# Patient Record
Sex: Male | Born: 1960 | Race: White | Hispanic: No | Marital: Married | Smoking: Former smoker
Health system: Southern US, Community
[De-identification: ages and names within clinical notes are randomized; demographics above are authoritative.]

---

## 2004-10-27 ENCOUNTER — Emergency Department (HOSPITAL_COMMUNITY): Admission: RE | Admit: 2004-10-27 | Discharge: 2004-10-27 | Payer: Self-pay | Admitting: Internal Medicine

## 2020-09-29 ENCOUNTER — Encounter (HOSPITAL_COMMUNITY): Payer: Self-pay

## 2020-09-29 ENCOUNTER — Emergency Department (HOSPITAL_COMMUNITY): Payer: Self-pay

## 2020-09-29 ENCOUNTER — Other Ambulatory Visit: Payer: Self-pay

## 2020-09-29 ENCOUNTER — Inpatient Hospital Stay (HOSPITAL_COMMUNITY)
Admission: EM | Admit: 2020-09-29 | Discharge: 2020-10-16 | DRG: 166 | Disposition: A | Discharge status: Hospice | Payer: Self-pay | Attending: Internal Medicine | Admitting: Internal Medicine

## 2020-09-29 DIAGNOSIS — C787 Secondary malignant neoplasm of liver and intrahepatic bile duct: Secondary | ICD-10-CM | POA: Diagnosis present

## 2020-09-29 DIAGNOSIS — I6340 Cerebral infarction due to embolism of unspecified cerebral artery: Secondary | ICD-10-CM | POA: Diagnosis not present

## 2020-09-29 DIAGNOSIS — J90 Pleural effusion, not elsewhere classified: Secondary | ICD-10-CM

## 2020-09-29 DIAGNOSIS — R918 Other nonspecific abnormal finding of lung field: Secondary | ICD-10-CM

## 2020-09-29 DIAGNOSIS — Z20822 Contact with and (suspected) exposure to covid-19: Secondary | ICD-10-CM | POA: Diagnosis present

## 2020-09-29 DIAGNOSIS — J9601 Acute respiratory failure with hypoxia: Secondary | ICD-10-CM | POA: Diagnosis present

## 2020-09-29 DIAGNOSIS — N179 Acute kidney failure, unspecified: Secondary | ICD-10-CM | POA: Diagnosis present

## 2020-09-29 DIAGNOSIS — Z66 Do not resuscitate: Secondary | ICD-10-CM | POA: Diagnosis not present

## 2020-09-29 DIAGNOSIS — I214 Non-ST elevation (NSTEMI) myocardial infarction: Secondary | ICD-10-CM

## 2020-09-29 DIAGNOSIS — Z91041 Radiographic dye allergy status: Secondary | ICD-10-CM

## 2020-09-29 DIAGNOSIS — Z9889 Other specified postprocedural states: Secondary | ICD-10-CM

## 2020-09-29 DIAGNOSIS — F419 Anxiety disorder, unspecified: Secondary | ICD-10-CM | POA: Diagnosis not present

## 2020-09-29 DIAGNOSIS — R000 Tachycardia, unspecified: Secondary | ICD-10-CM

## 2020-09-29 DIAGNOSIS — Z79899 Other long term (current) drug therapy: Secondary | ICD-10-CM

## 2020-09-29 DIAGNOSIS — M25512 Pain in left shoulder: Secondary | ICD-10-CM | POA: Diagnosis not present

## 2020-09-29 DIAGNOSIS — I110 Hypertensive heart disease with heart failure: Secondary | ICD-10-CM | POA: Diagnosis present

## 2020-09-29 DIAGNOSIS — R778 Other specified abnormalities of plasma proteins: Secondary | ICD-10-CM | POA: Diagnosis present

## 2020-09-29 DIAGNOSIS — I959 Hypotension, unspecified: Secondary | ICD-10-CM | POA: Diagnosis not present

## 2020-09-29 DIAGNOSIS — R29702 NIHSS score 2: Secondary | ICD-10-CM | POA: Diagnosis not present

## 2020-09-29 DIAGNOSIS — D509 Iron deficiency anemia, unspecified: Secondary | ICD-10-CM | POA: Diagnosis present

## 2020-09-29 DIAGNOSIS — C7951 Secondary malignant neoplasm of bone: Secondary | ICD-10-CM | POA: Diagnosis present

## 2020-09-29 DIAGNOSIS — Z87891 Personal history of nicotine dependence: Secondary | ICD-10-CM

## 2020-09-29 DIAGNOSIS — Z8043 Family history of malignant neoplasm of testis: Secondary | ICD-10-CM

## 2020-09-29 DIAGNOSIS — M7989 Other specified soft tissue disorders: Secondary | ICD-10-CM | POA: Diagnosis present

## 2020-09-29 DIAGNOSIS — I472 Ventricular tachycardia: Secondary | ICD-10-CM | POA: Diagnosis not present

## 2020-09-29 DIAGNOSIS — B952 Enterococcus as the cause of diseases classified elsewhere: Secondary | ICD-10-CM | POA: Diagnosis present

## 2020-09-29 DIAGNOSIS — J910 Malignant pleural effusion: Secondary | ICD-10-CM | POA: Diagnosis present

## 2020-09-29 DIAGNOSIS — G9341 Metabolic encephalopathy: Secondary | ICD-10-CM | POA: Diagnosis not present

## 2020-09-29 DIAGNOSIS — R7989 Other specified abnormal findings of blood chemistry: Secondary | ICD-10-CM | POA: Diagnosis not present

## 2020-09-29 DIAGNOSIS — I4891 Unspecified atrial fibrillation: Secondary | ICD-10-CM | POA: Diagnosis not present

## 2020-09-29 DIAGNOSIS — R32 Unspecified urinary incontinence: Secondary | ICD-10-CM | POA: Diagnosis not present

## 2020-09-29 DIAGNOSIS — I248 Other forms of acute ischemic heart disease: Secondary | ICD-10-CM | POA: Diagnosis present

## 2020-09-29 DIAGNOSIS — J9811 Atelectasis: Secondary | ICD-10-CM | POA: Diagnosis present

## 2020-09-29 DIAGNOSIS — C799 Secondary malignant neoplasm of unspecified site: Secondary | ICD-10-CM

## 2020-09-29 DIAGNOSIS — I2510 Atherosclerotic heart disease of native coronary artery without angina pectoris: Secondary | ICD-10-CM | POA: Diagnosis present

## 2020-09-29 DIAGNOSIS — I5021 Acute systolic (congestive) heart failure: Secondary | ICD-10-CM | POA: Diagnosis present

## 2020-09-29 DIAGNOSIS — G4700 Insomnia, unspecified: Secondary | ICD-10-CM | POA: Diagnosis not present

## 2020-09-29 DIAGNOSIS — C3412 Malignant neoplasm of upper lobe, left bronchus or lung: Principal | ICD-10-CM | POA: Diagnosis present

## 2020-09-29 DIAGNOSIS — I4892 Unspecified atrial flutter: Secondary | ICD-10-CM | POA: Diagnosis not present

## 2020-09-29 DIAGNOSIS — N390 Urinary tract infection, site not specified: Secondary | ICD-10-CM | POA: Diagnosis present

## 2020-09-29 DIAGNOSIS — R0602 Shortness of breath: Secondary | ICD-10-CM

## 2020-09-29 DIAGNOSIS — R059 Cough, unspecified: Secondary | ICD-10-CM

## 2020-09-29 DIAGNOSIS — Z5329 Procedure and treatment not carried out because of patient's decision for other reasons: Secondary | ICD-10-CM | POA: Diagnosis not present

## 2020-09-29 DIAGNOSIS — C7801 Secondary malignant neoplasm of right lung: Secondary | ICD-10-CM | POA: Diagnosis present

## 2020-09-29 DIAGNOSIS — I253 Aneurysm of heart: Secondary | ICD-10-CM | POA: Diagnosis present

## 2020-09-29 DIAGNOSIS — I471 Supraventricular tachycardia: Secondary | ICD-10-CM | POA: Diagnosis not present

## 2020-09-29 DIAGNOSIS — I619 Nontraumatic intracerebral hemorrhage, unspecified: Secondary | ICD-10-CM | POA: Diagnosis not present

## 2020-09-29 DIAGNOSIS — I639 Cerebral infarction, unspecified: Secondary | ICD-10-CM

## 2020-09-29 DIAGNOSIS — E785 Hyperlipidemia, unspecified: Secondary | ICD-10-CM | POA: Diagnosis present

## 2020-09-29 DIAGNOSIS — Z515 Encounter for palliative care: Secondary | ICD-10-CM

## 2020-09-29 LAB — APTT: aPTT: 28 seconds (ref 24–36)

## 2020-09-29 LAB — TROPONIN I (HIGH SENSITIVITY)
Troponin I (High Sensitivity): 1087 ng/L (ref <18)
Troponin I (High Sensitivity): 1380 ng/L (ref <18)

## 2020-09-29 LAB — CBC WITH DIFFERENTIAL/PLATELET
Abs Immature Granulocytes: 0.09 10*3/uL — ABNORMAL HIGH (ref 0.00–0.07)
Basophils Absolute: 0 10*3/uL (ref 0.0–0.1)
Basophils Relative: 0 %
Eosinophils Absolute: 0 10*3/uL (ref 0.0–0.5)
Eosinophils Relative: 0 %
HCT: 44.2 % (ref 39.0–52.0)
Hemoglobin: 14.1 g/dL (ref 13.0–17.0)
Immature Granulocytes: 1 %
Lymphocytes Relative: 7 %
Lymphs Abs: 1 10*3/uL (ref 0.7–4.0)
MCH: 25.1 pg — ABNORMAL LOW (ref 26.0–34.0)
MCHC: 31.9 g/dL (ref 30.0–36.0)
MCV: 78.6 fL — ABNORMAL LOW (ref 80.0–100.0)
Monocytes Absolute: 1.3 10*3/uL — ABNORMAL HIGH (ref 0.1–1.0)
Monocytes Relative: 10 %
Neutro Abs: 10.7 10*3/uL — ABNORMAL HIGH (ref 1.7–7.7)
Neutrophils Relative %: 82 %
Platelets: 306 10*3/uL (ref 150–400)
RBC: 5.62 MIL/uL (ref 4.22–5.81)
RDW: 17.8 % — ABNORMAL HIGH (ref 11.5–15.5)
WBC: 13.2 10*3/uL — ABNORMAL HIGH (ref 4.0–10.5)
nRBC: 0 % (ref 0.0–0.2)

## 2020-09-29 LAB — COMPREHENSIVE METABOLIC PANEL
ALT: 29 U/L (ref 0–44)
AST: 33 U/L (ref 15–41)
Albumin: 2.7 g/dL — ABNORMAL LOW (ref 3.5–5.0)
Alkaline Phosphatase: 90 U/L (ref 38–126)
Anion gap: 10 (ref 5–15)
BUN: 13 mg/dL (ref 6–20)
CO2: 22 mmol/L (ref 22–32)
Calcium: 9 mg/dL (ref 8.9–10.3)
Chloride: 100 mmol/L (ref 98–111)
Creatinine, Ser: 1.14 mg/dL (ref 0.61–1.24)
GFR, Estimated: >60 mL/min (ref >60)
Glucose, Bld: 113 mg/dL — ABNORMAL HIGH (ref 70–99)
Potassium: 3.7 mmol/L (ref 3.5–5.1)
Sodium: 132 mmol/L — ABNORMAL LOW (ref 135–145)
Total Bilirubin: 0.6 mg/dL (ref 0.3–1.2)
Total Protein: 6.3 g/dL — ABNORMAL LOW (ref 6.5–8.1)

## 2020-09-29 LAB — BRAIN NATRIURETIC PEPTIDE: B Natriuretic Peptide: 205 pg/mL — ABNORMAL HIGH (ref 0.0–100.0)

## 2020-09-29 LAB — RESP PANEL BY RT-PCR (FLU A&B, COVID) ARPGX2
Influenza A by PCR: NEGATIVE
Influenza B by PCR: NEGATIVE
SARS Coronavirus 2 by RT PCR: NEGATIVE

## 2020-09-29 LAB — URINALYSIS, ROUTINE W REFLEX MICROSCOPIC
Bacteria, UA: NONE SEEN
Bilirubin Urine: NEGATIVE
Glucose, UA: NEGATIVE mg/dL
Ketones, ur: NEGATIVE mg/dL
Leukocytes,Ua: NEGATIVE
Nitrite: NEGATIVE
Protein, ur: NEGATIVE mg/dL
Specific Gravity, Urine: 1.005 (ref 1.005–1.030)
pH: 6 (ref 5.0–8.0)

## 2020-09-29 LAB — LACTIC ACID, PLASMA: Lactic Acid, Venous: 1.3 mmol/L (ref 0.5–1.9)

## 2020-09-29 LAB — PROTIME-INR
INR: 1.2 (ref 0.8–1.2)
Prothrombin Time: 14.3 seconds (ref 11.4–15.2)

## 2020-09-29 MED ORDER — SODIUM CHLORIDE 0.9 % IV SOLN
500.0000 mg | INTRAVENOUS | Status: DC
  Administered 2020-09-29: 500 mg via INTRAVENOUS
  Filled 2020-09-29: qty 500

## 2020-09-29 MED ORDER — ONDANSETRON HCL 4 MG PO TABS: 4.0000 mg | ORAL_TABLET | Freq: Four times a day (QID) | ORAL | Status: DC | PRN

## 2020-09-29 MED ORDER — ACETAMINOPHEN 650 MG RE SUPP: 650.0000 mg | Freq: Four times a day (QID) | RECTAL | Status: DC | PRN

## 2020-09-29 MED ORDER — LACTATED RINGERS IV SOLN: INTRAVENOUS | Status: DC

## 2020-09-29 MED ORDER — POLYETHYLENE GLYCOL 3350 17 G PO PACK: 17.0000 g | PACK | Freq: Every day | ORAL | Status: DC | PRN

## 2020-09-29 MED ORDER — POTASSIUM CHLORIDE IN NACL 20-0.9 MEQ/L-% IV SOLN
INTRAVENOUS | Status: DC
  Filled 2020-09-29: qty 1000

## 2020-09-29 MED ORDER — HEPARIN BOLUS VIA INFUSION
4000.0000 [IU] | Freq: Once | INTRAVENOUS | Status: AC
  Administered 2020-09-29: 4000 [IU] via INTRAVENOUS

## 2020-09-29 MED ORDER — SODIUM CHLORIDE 0.9 % IV SOLN
2.0000 g | INTRAVENOUS | Status: DC
  Administered 2020-09-29: 2 g via INTRAVENOUS
  Filled 2020-09-29: qty 20

## 2020-09-29 MED ORDER — ACETAMINOPHEN 325 MG PO TABS
650.0000 mg | ORAL_TABLET | Freq: Four times a day (QID) | ORAL | Status: DC | PRN
  Administered 2020-10-04 – 2020-10-10 (×2): 650 mg via ORAL
  Filled 2020-09-29 (×2): qty 2

## 2020-09-29 MED ORDER — HEPARIN (PORCINE) 25000 UT/250ML-% IV SOLN
2100.0000 [IU]/h | INTRAVENOUS | Status: DC
  Administered 2020-09-29: 1100 [IU]/h via INTRAVENOUS
  Administered 2020-09-30: 1950 [IU]/h via INTRAVENOUS
  Administered 2020-09-30: 1500 [IU]/h via INTRAVENOUS
  Administered 2020-10-01 – 2020-10-03 (×4): 1950 [IU]/h via INTRAVENOUS
  Administered 2020-10-04 – 2020-10-05 (×2): 2100 [IU]/h via INTRAVENOUS
  Filled 2020-09-29 (×11): qty 250

## 2020-09-29 MED ORDER — ONDANSETRON HCL 4 MG/2ML IJ SOLN
4.0000 mg | Freq: Four times a day (QID) | INTRAMUSCULAR | Status: DC | PRN
  Administered 2020-10-05 – 2020-10-06 (×3): 4 mg via INTRAVENOUS
  Filled 2020-09-29 (×3): qty 2

## 2020-09-29 MED ORDER — LABETALOL HCL 5 MG/ML IV SOLN
10.0000 mg | INTRAVENOUS | Status: DC | PRN
  Administered 2020-09-29: 10 mg via INTRAVENOUS
  Filled 2020-09-29: qty 4

## 2020-09-29 MED ORDER — LABETALOL HCL 5 MG/ML IV SOLN: 10.0000 mg | INTRAVENOUS | Status: DC | PRN

## 2020-09-29 NOTE — H&P (Addendum)
History and Physical    Brett Medina KZS:[id number] DOB: Mar 12, 1961 DOA: 09/29/2020  PCP: Patient, No Pcp Per  Patient coming from: Home  I have personally briefly reviewed patient's old medical records in [place]  Chief Complaint: Difficulty breathing  HPI: Brett Medina is a 59 y.o. male with medical history significant for tobacco abuse.  Presented to the ED with complaints of difficulty breathing ongoing over the past 2 months at least.  Reports he was seen at urgent care several x40 was given prednisone.  Patient quit smoking cigarettes about 2 months ago. He denies chest pain.  Has chronic unchanged bilateral lower extremity swelling.  Reports a productive cough over the past few weeks. No family history of heart attacks.  ED Course: O2 sats 91 to 97% on room air.  Tachycardic to 135, mild tachypnea to 22.  Lactic acid 1.3.  WBC 13.2.  Troponin 1087.  UA with moderate hemoglobin.  Chest CT-Constellation of findings consistent with metastatic malignancy, moderate to large volume left pleural effusion with almost complete collapse of left upper and lower lobes (see detailed report).  Patient reports contrast allergy, he refused CT with contrast to rule out PE as he was tachycardic.  Ceftriaxone and doxycycline given for possible pneumonia.  Heparin drip started.  Hospitalist admit for large left pleural effusion.  Review of Systems: As per HPI all other systems reviewed and negative.  History reviewed. No pertinent past medical history.  History reviewed. No pertinent surgical history.   reports that he quit smoking about 2 months ago. His smoking use included cigarettes. He has never used smokeless tobacco. He reports current alcohol use. He reports previous drug use. Drug: Marijuana.  No Known Allergies  No known family history of coronary artery disease.  Prior to Admission medications   Medication Sig Start Date End Date Taking? Authorizing Provider  albuterol  (VENTOLIN HFA) 108 (90 Base) MCG/ACT inhaler Inhale 2 puffs into the lungs every 6 (six) hours as needed. 09/14/20 09/14/21 Yes [provider]  benzonatate (TESSALON) 100 MG capsule Take 1 capsule by mouth every 6 (six) hours as needed for cough. 09/21/20 09/21/21 Yes [provider]  doxycycline (VIBRA-TABS) 100 MG tablet Take 100 mg by mouth 2 (two) times daily. 09/21/20  Yes [provider]    Physical Exam: Vitals:   09/29/20 1537 09/29/20 1546 09/29/20 1700 09/29/20 1830  BP: (!) 171/122  (!) 152/112 (!) 138/109  Pulse: (!) 126  (!) 131 (!) 124  Resp: (!) 22  (!) 22 (!) 22  Temp:      TempSrc:      SpO2: 92% 91% 97% 96%  Weight:      Height:        Constitutional: Mild increased work of breathing, otherwise appears comfortable Vitals:   09/29/20 1537 09/29/20 1546 09/29/20 1700 09/29/20 1830  BP: (!) 171/122  (!) 152/112 (!) 138/109  Pulse: (!) 126  (!) 131 (!) 124  Resp: (!) 22  (!) 22 (!) 22  Temp:      TempSrc:      SpO2: 92% 91% 97% 96%  Weight:      Height:       Eyes: PERRL, lids and conjunctivae normal ENMT: Mucous membranes are dry. Neck: normal, supple, no masses, no thyromegaly Respiratory: Absent breath sounds left lung, mild expiratory rhonchi right lung.  Mild increased work of breathing.   Cardiovascular: Tachycardic, regular rate and rhythm, no murmurs / rubs / gallops.  +  1 chronic extremity edema.  Bilateral lower extremities warm and well perfused. Abdomen: no tenderness, no masses palpated. No hepatosplenomegaly. Bowel sounds positive.  Musculoskeletal: no clubbing / cyanosis. No joint deformity upper and lower extremities. Good ROM, no contractures. Normal muscle tone.  Skin: no rashes, lesions, ulcers. No induration Neurologic: No apparent cranial abnormality, moving extremities spontaneously Psychiatric: Normal judgment and insight. Alert and oriented x 3. Normal mood.   Labs on Admission: I have personally reviewed  following labs and imaging studies  CBC: Recent Labs  Lab 09/29/20 1635  WBC 13.2*  NEUTROABS 10.7*  HGB 14.1  HCT 44.2  MCV 78.6*  PLT 308   Basic Metabolic Panel: Recent Labs  Lab 09/29/20 1635  NA 132*  K 3.7  CL 100  CO2 22  GLUCOSE 113*  BUN 13  CREATININE 1.14  CALCIUM 9.0   Liver Function Tests: Recent Labs  Lab 09/29/20 1635  AST 33  ALT 29  ALKPHOS 90  BILITOT 0.6  PROT 6.3*  ALBUMIN 2.7*   Coagulation Profile: Recent Labs  Lab 09/29/20 1635  INR 1.2    Urine analysis:    Component Value Date/Time   COLORURINE YELLOW 09/29/2020 [place] 09/29/2020 1751   LABSPEC 1.005 09/29/2020 1751   PHURINE 6.0 09/29/2020 1751   GLUCOSEU NEGATIVE 09/29/2020 1751   HGBUR MODERATE (A) 09/29/2020 1751   BILIRUBINUR NEGATIVE 09/29/2020 1751   KETONESUR NEGATIVE 09/29/2020 1751   PROTEINUR NEGATIVE 09/29/2020 1751   NITRITE NEGATIVE 09/29/2020 1751   LEUKOCYTESUR NEGATIVE 09/29/2020 1751    Radiological Exams on Admission: DG Chest 2 View  Result Date: 09/29/2020 CLINICAL DATA:  Shortness of breath EXAM: CHEST - 2 VIEW COMPARISON:  October 27, 2004 FINDINGS: There is essentially complete consolidation of the left hemithorax with probable combination of pleural effusion and extensive airspace consolidation throughout the left lung. There is a nodular opacity in the periphery of the right mid lung measuring 1.0 x 0.9 cm. There is interstitial prominence on the right which in part may be due to redistribution of blood flow to viable lung segments. Heart size appears grossly normal. Pulmonary vascularity on the right is normal. No adenopathy is evident in areas that can be assessed for potential adenopathy by radiography. Note that opacification on the left precludes assessment for potential adenopathy on the left. No bone lesions. IMPRESSION: Widespread airspace opacity on the left, likely due to combination of consolidation and pleural effusion.  Underlying neoplasm on the left cannot be excluded. Given this circumstance, correlation with contrast enhanced chest CT may well be advisable at this time to further evaluate Nodular opacity periphery of right mid lung concerning for potential small neoplastic focus. Interstitial prominence on the right may in large part be due to redistribution of blood flow to viable lung segments. Heart size within normal limits. Electronically Signed   By: Lowella Grip III M.D.   On: 09/29/2020 16:13   CT Chest Wo Contrast  Result Date: 09/29/2020 CLINICAL DATA:  Chest pain and shortness of breath worsening for the past 2 months. Evaluation for malignancy, infection, mass, fluid. EXAM: CT CHEST WITHOUT CONTRAST TECHNIQUE: Multidetector CT imaging of the chest was performed following the standard protocol without IV contrast. COMPARISON:  Chest x-ray 09/29/2020, chest x-ray 10/27/2004 FINDINGS: Cardiovascular: Normal heart size. Trace pericardial effusion. The aortic root appears to be normal in caliber. Suggestion of possible mild aortic valve leaflet calcifications. The ascending thoracic aorta is enlarged in caliber measuring up to 4.5  cm. The descending thoracic aorta is normal in caliber. Mild atherosclerotic plaque of the thoracic aorta. Possible mild left anterior descending coronary artery calcification. Mediastinum/Nodes: There is a 1.1 cm infra cardiac enlarged round lymph node (2:127). Enlarged mediastinal lymph nodes with as an example a right 1 cm paratracheal lymph node (2: 44) and a enlarged 1.1 cm subcarinal lymph node (2:65). Limited sensitivity for the detection of hilar adenopathy on this noncontrast study. No axillary lymphadenopathy bilaterally. Lungs/Pleura: At least mild paraseptal and severe centrilobular emphysematous changes. Almost complete collapse of the left upper and lower lobes. There is a 1 cm solid round pulmonary nodule within the right middle lobe (4:83,99). Slightly more inferior,  there there are a 0.6 cm right middle lobe pulmonary nodules (4:89). Several pulmonary micronodules at the right apex (4:17, 24, 31). Couple of round pulmonary nodule within the right lower lobe measuring 0.5 and 0.6 cm (4:1 16-117). Ground-glass subpleural nodule measuring 0.7 cm in the right middle lobe (4:96). Peribronchovascular nodular like 3.2 cm ground-glass airspace opacity within the right lower lobe (4:111). Diffuse mild bronchial wall thickening. Moderate to large volume simple fluid left pleural effusion. No right pleural effusion. No pneumothorax. Upper Abdomen: Suggestion of a partially visualized splenule. Otherwise no acute abnormality. Musculoskeletal: Indeterminate 1 cm subcutaneus soft tissue density within the left anterolateral back at the level of scapula (2:55). Several other subcentimeter soft tissue densities within the left lower back (2:68, 99). Possible seroma findings that is collimated off view within the right anterolateral back (2:52). Sclerotic thoracic vertebra lesions within the T6, T9, T11, T12 vertebral bodies. Possible tiny sclerotic foci within the left aspect of the T7 vertebral body (6:103). Similarly within the left aspect of the T4 and T5 vertebral bodies. Sclerotic lesion within the right transverse process of the T3 vertebral body (4:10). No acute displaced fracture. Multilevel mild degenerative changes of the spine. IMPRESSION: 1. Constellation of findings consistent with metastatic malignancy. 2. Moderate to large volume left pleural effusion with almost complete collapse of the left upper and lower lobes. Associated mediastinal lymphadenopathy and limited evaluation for hilar lymphadenopathy on this noncontrast study. Underlying malignancy suspected (ddx includes metastasis or primary lung). Consider analysis of the pleural fluid for further evaluation. 3. Indeterminate multifocal right lung pulmonary nodules measuring up to 1 cm in the right middle lobe. 4.  Indeterminate 3.2 cm peribronchovascular ground-glass airspace opacity within the right lower lobe. 5. Scattered suspicious sclerotic lesions concerning for osseous metastases. 6. Indeterminate pericentimeter subcutaneus soft tissue densities within the bilateral anterolateral back at the level of scapulas. 7. Severe emphysematous changes. 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 9. Aortic Atherosclerosis (ICD10-I70.0) and Emphysema (ICD10-J43.9). Electronically Signed   By: Iven Finn M.D.   On: 09/29/2020 17:19    EKG: Independently reviewed.  Sinus tachycardia rate 128.  No significant ST or T wave abnormalities.  No old EKG to compare.  Assessment/Plan Active Problems:   Large pleural effusion   Elevated troponin  Large left pleural effusion-not hypoxic O2 sat 91 to 97% on room air, placed on 2 L for comfort, chest CT-findings consistent with metastatic malignancy with moderate to large left pleural effusion, almost complete collapse of left upper and lower lobes, mediastinal lymphadenopathy, suspicious show sclerotic lesions concerning for osseous metastasis ( see detailed report). ??? If Pleural tap can be done considering metastatic disease. -Will admit to [facility], patient will need multiple subspecialties- IR, oncology to start with - IR consult placed, US guided thoracentesis in  a.m - Pls consult oncology in a.m. -CBC in a.m. -Continue IV heparin for now, his large pleural effusion likely driving his tachycardia, but if persistent tachycardia may need to rule out PE. (Currently refusing CT with contrast due to allergy reaction, even with premedication). - IVF n/s + 20kcl 75cc/hr x 12hrs  Elevated troponin-1087 > 1380s, Quite high to attribute to just demand ischemia.  No cardiac history. EKG without significant abnormalities, no old EKG to compare.  He was started on heparin drip prophylactically in ED.  -Obtain echocardiogram -Trend troponin -For now continue heparin  drip - Pls consult cardiology in a.m -Lipid panel in a.m.  Leukocytosis-13.2 and tachycardia, likely stress reaction due to large left pleural effusion and dyspnea.  Normal lactic acid 1.3.  Chest CT not convincing for pneumonia. -Will hold off on further antibiotics at this time  DVT prophylaxis: Heparin drip Code Status:.Full code Family Communication: Spouse at bedside Disposition Plan:  > 2 days.  Pending pleural fluid evaluation. Consults called: Please consult IR and oncology in a.m. Admission status: Inpatient, stepdown. I certify that at the point of admission it is my clinical judgment that the patient will require inpatient hospital care spanning beyond 2 midnights from the point of admission due to high intensity of service, high risk for further deterioration and high frequency of surveillance required. The following factors support the patient status of inpatient: Mild left pleural effusion with elevated troponin requiring close monitoring.   Bethena Roys MD [facility]  09/29/2020, 9:18 PM

## 2020-09-29 NOTE — ED Notes (Signed)
ED TO INPATIENT HANDOFF REPORT  ED Nurse Name and Phone #: Wilhelmina Hark [phone]  S Name/Age/Gender Brett Medina 59 y.o. male Room/Bed: APA04/APA04  Code Status   Code Status: Not on file  Home/SNF/Other Home Patient oriented to: self, place, time and situation Is this baseline? Yes   Triage Complete: Triage complete  Chief Complaint Large pleural effusion [J90]  Triage Note Pt to er, pt states that he has been sob for the past few months, states that it has recently gotten worse.  States that he was tested for covid last Saturday and Tuesday he got the result that he was negative, states that he has been to urgent care several times and they have given him prednisone.      Allergies No Known Allergies  Level of Care/Admitting Diagnosis ED Disposition    ED Disposition Condition [facility] Area: [place] [[id number]]  Level of Care: Progressive [102]  Admit to Progressive based on following criteria: CARDIOVASCULAR & THORACIC of moderate stability with acute coronary syndrome symptoms/low risk myocardial infarction/hypertensive urgency/arrhythmias/heart failure potentially compromising stability and stable post cardiovascular intervention patients.  May admit patient to [facility] or [facility] if equivalent level of care is available:: No  Covid Evaluation: Asymptomatic Screening Protocol (No Symptoms)  Diagnosis: Large pleural effusion [[id number]]  Admitting Physician: Bethena Roys [[id number]]  Attending Physician: Bethena Roys [phone]  Estimated length of stay: past midnight tomorrow  Certification:: I certify this patient will need inpatient services for at least 2 midnights       B Medical/Surgery History History reviewed. No pertinent past medical history. History reviewed. No pertinent surgical history.   A IV Location/Drains/Wounds Patient Lines/Drains/Airways Status    Active Line/Drains/Airways    Name Placement  date Placement time Site Days   Peripheral IV 09/29/20 Left Hand 09/29/20  1630  Hand  less than 1   Peripheral IV 09/29/20 Right Hand 09/29/20  1645  Hand  less than 1          Intake/Output Last 24 hours No intake or output data in the 24 hours ending 09/29/20 1913  Labs/Imaging Results for orders placed or performed during the hospital encounter of 09/29/20 (from the past 48 hour(s))  Lactic acid, plasma     Status: None   Collection Time: 09/29/20  4:35 PM  Result Value Ref Range   Lactic Acid, Venous 1.3 0.5 - 1.9 mmol/L    Comment: Performed at [facility], [address], [place], [place] [address]  Comprehensive metabolic panel     Status: Abnormal   Collection Time: 09/29/20  4:35 PM  Result Value Ref Range   Sodium 132 (L) 135 - 145 mmol/L   Potassium 3.7 3.5 - 5.1 mmol/L   Chloride 100 98 - 111 mmol/L   CO2 22 22 - 32 mmol/L   Glucose, Bld 113 (H) 70 - 99 mg/dL    Comment: Glucose reference range applies only to samples taken after fasting for at least 8 hours.   BUN 13 6 - 20 mg/dL   Creatinine, Ser 1.14 0.61 - 1.24 mg/dL   Calcium 9.0 8.9 - 10.3 mg/dL   Total Protein 6.3 (L) 6.5 - 8.1 g/dL   Albumin 2.7 (L) 3.5 - 5.0 g/dL   AST 33 15 - 41 U/L   ALT 29 0 - 44 U/L   Alkaline Phosphatase 90 38 - 126 U/L   Total Bilirubin 0.6 0.3 - 1.2 mg/dL  GFR, Estimated >60 >60 mL/min    Comment: (NOTE) Calculated using the CKD-EPI Creatinine Equation (2021)    Anion gap 10 5 - 15    Comment: Performed at [facility], [address], [place], [place] [address]  CBC WITH DIFFERENTIAL     Status: Abnormal   Collection Time: 09/29/20  4:35 PM  Result Value Ref Range   WBC 13.2 (H) 4.0 - 10.5 K/uL   RBC 5.62 4.22 - 5.81 MIL/uL   Hemoglobin 14.1 13.0 - 17.0 g/dL   HCT 44.2 39 - 52 %   MCV 78.6 (L) 80.0 - 100.0 fL   MCH 25.1 (L) 26.0 - 34.0 pg   MCHC 31.9 30.0 - 36.0 g/dL   RDW 17.8 (H) 11.5 - 15.5 %   Platelets 306 150 - 400 K/uL   nRBC 0.0 0.0 - 0.2 %   Neutrophils  Relative % 82 %   Neutro Abs 10.7 (H) 1.7 - 7.7 K/uL   Lymphocytes Relative 7 %   Lymphs Abs 1.0 0.7 - 4.0 K/uL   Monocytes Relative 10 %   Monocytes Absolute 1.3 (H) 0.1 - 1.0 K/uL   Eosinophils Relative 0 %   Eosinophils Absolute 0.0 0.0 - 0.5 K/uL   Basophils Relative 0 %   Basophils Absolute 0.0 0.0 - 0.1 K/uL   Immature Granulocytes 1 %   Abs Immature Granulocytes 0.09 (H) 0.00 - 0.07 K/uL    Comment: Performed at [facility], [address] [place], [place] [address]  Protime-INR     Status: None   Collection Time: 09/29/20  4:35 PM  Result Value Ref Range   Prothrombin Time 14.3 11.4 - 15.2 seconds   INR 1.2 0.8 - 1.2    Comment: (NOTE) INR goal varies based on device and disease states. Performed at [facility], [address] [place], [place] [address]   APTT     Status: None   Collection Time: 09/29/20  4:35 PM  Result Value Ref Range   aPTT 28 24 - 36 seconds    Comment: Performed at [facility], [address] [place], [place] [address]  Blood Culture (routine x 2)     Status: None (Preliminary result)   Collection Time: 09/29/20  4:35 PM   Specimen: BLOOD LEFT HAND  Result Value Ref Range   Specimen Description BLOOD LEFT HAND    Special Requests      BOTTLES DRAWN AEROBIC AND ANAEROBIC Blood Culture adequate volume Performed at [facility], [address] [place], [place] [address]    Culture PENDING    Report Status PENDING   Brain natriuretic peptide     Status: Abnormal   Collection Time: 09/29/20  4:35 PM  Result Value Ref Range   B Natriuretic Peptide 205.0 (H) 0.0 - 100.0 pg/mL    Comment: Performed at [facility], [address] [place], [place] [address]  Troponin I (High Sensitivity)     Status: Abnormal   Collection Time: 09/29/20  4:35 PM  Result Value Ref Range   Troponin I (High Sensitivity) 1,087 (HH) <18 ng/L    Comment: CRITICAL RESULT CALLED TO, READ BACK BY AND VERIFIED WITH: BILLINGS,D @ 1749 ON 09/29/20 BY JUW (NOTE) Elevated  high sensitivity troponin I (hsTnI) values and significant  changes across serial measurements may suggest ACS but many other  chronic and acute conditions are known to elevate hsTnI results.  Refer to the Links section for chest pain algorithms and additional  guidance. Performed  at [facility], [address] [place], [place] [address]   Blood Culture (routine x 2)     Status: None (Preliminary result)   Collection Time: 09/29/20  4:45 PM   Specimen: BLOOD RIGHT HAND  Result Value Ref Range   Specimen Description BLOOD RIGHT HAND    Special Requests      BOTTLES DRAWN AEROBIC AND ANAEROBIC Blood Culture results may not be optimal due to an inadequate volume of blood received in culture bottles Performed at [facility], [address], [place], [place] [address]    Culture PENDING    Report Status PENDING   Resp Panel by RT-PCR (Flu A&B, Covid) Nasopharyngeal Swab     Status: None   Collection Time: 09/29/20  5:07 PM   Specimen: Nasopharyngeal Swab; Nasopharyngeal(NP) swabs in vial transport medium  Result Value Ref Range   SARS Coronavirus 2 by RT PCR NEGATIVE NEGATIVE    Comment: (NOTE) SARS-CoV-2 target nucleic acids are NOT DETECTED.  The SARS-CoV-2 RNA is generally detectable in upper respiratory specimens during the acute phase of infection. The lowest concentration of SARS-CoV-2 viral copies this assay can detect is 138 copies/mL. A negative result does not preclude SARS-Cov-2 infection and should not be used as the sole basis for treatment or other patient management decisions. A negative result may occur with  improper specimen collection/handling, submission of specimen other than nasopharyngeal swab, presence of viral mutation(s) within the areas targeted by this assay, and inadequate number of viral copies(<138 copies/mL). A negative result must be combined with clinical observations, patient history, and epidemiological information. The expected result is  Negative.  Fact Sheet for Patients:  EntrepreneurPulse.com.au  Fact Sheet for Healthcare Providers:  IncredibleEmployment.be  This test is no t yet approved or cleared by the [place] FDA and  has been authorized for detection and/or diagnosis of SARS-CoV-2 by FDA under an Emergency Use Authorization (EUA). This EUA will remain  in effect (meaning this test can be used) for the duration of the COVID-19 declaration under Section 564(b)(1) of the Act, 21 U.S.C.section 360bbb-3(b)(1), unless the authorization is terminated  or revoked sooner.       Influenza A by PCR NEGATIVE NEGATIVE   Influenza B by PCR NEGATIVE NEGATIVE    Comment: (NOTE) The Xpert Xpress SARS-CoV-2/FLU/RSV plus assay is intended as an aid in the diagnosis of influenza from Nasopharyngeal swab specimens and should not be used as a sole basis for treatment. Nasal washings and aspirates are unacceptable for Xpert Xpress SARS-CoV-2/FLU/RSV testing.  Fact Sheet for Patients: EntrepreneurPulse.com.au  Fact Sheet for Healthcare Providers: IncredibleEmployment.be  This test is not yet approved or cleared by the [place] FDA and has been authorized for detection and/or diagnosis of SARS-CoV-2 by FDA under an Emergency Use Authorization (EUA). This EUA will remain in effect (meaning this test can be used) for the duration of the COVID-19 declaration under Section 564(b)(1) of the Act, 21 U.S.C. section 360bbb-3(b)(1), unless the authorization is terminated or revoked.  Performed at [facility], [address], [place],  [address]   Urinalysis, Routine w reflex microscopic     Status: Abnormal   Collection Time: 09/29/20  5:51 PM  Result Value Ref Range   Color, Urine YELLOW YELLOW   APPearance CLEAR CLEAR   Specific Gravity, Urine 1.005 1.005 - 1.030   pH 6.0 5.0 - 8.0   Glucose, UA NEGATIVE NEGATIVE mg/dL   Hgb urine  dipstick MODERATE (A) NEGATIVE   Bilirubin Urine NEGATIVE  NEGATIVE   Ketones, ur NEGATIVE NEGATIVE mg/dL   Protein, ur NEGATIVE NEGATIVE mg/dL   Nitrite NEGATIVE NEGATIVE   Leukocytes,Ua NEGATIVE NEGATIVE   RBC / HPF 0-5 0 - 5 RBC/hpf   WBC, UA 0-5 0 - 5 WBC/hpf   Bacteria, UA NONE SEEN NONE SEEN    Comment: Performed at [facility], [address], [place], [place] [address]   DG Chest 2 View  Result Date: 09/29/2020 CLINICAL DATA:  Shortness of breath EXAM: CHEST - 2 VIEW COMPARISON:  October 27, 2004 FINDINGS: There is essentially complete consolidation of the left hemithorax with probable combination of pleural effusion and extensive airspace consolidation throughout the left lung. There is a nodular opacity in the periphery of the right mid lung measuring 1.0 x 0.9 cm. There is interstitial prominence on the right which in part may be due to redistribution of blood flow to viable lung segments. Heart size appears grossly normal. Pulmonary vascularity on the right is normal. No adenopathy is evident in areas that can be assessed for potential adenopathy by radiography. Note that opacification on the left precludes assessment for potential adenopathy on the left. No bone lesions. IMPRESSION: Widespread airspace opacity on the left, likely due to combination of consolidation and pleural effusion. Underlying neoplasm on the left cannot be excluded. Given this circumstance, correlation with contrast enhanced chest CT may well be advisable at this time to further evaluate Nodular opacity periphery of right mid lung concerning for potential small neoplastic focus. Interstitial prominence on the right may in large part be due to redistribution of blood flow to viable lung segments. Heart size within normal limits. Electronically Signed   By: Lowella Grip III M.D.   On: 09/29/2020 16:13   CT Chest Wo Contrast  Result Date: 09/29/2020 CLINICAL DATA:  Chest pain and shortness of breath worsening  for the past 2 months. Evaluation for malignancy, infection, mass, fluid. EXAM: CT CHEST WITHOUT CONTRAST TECHNIQUE: Multidetector CT imaging of the chest was performed following the standard protocol without IV contrast. COMPARISON:  Chest x-ray 09/29/2020, chest x-ray 10/27/2004 FINDINGS: Cardiovascular: Normal heart size. Trace pericardial effusion. The aortic root appears to be normal in caliber. Suggestion of possible mild aortic valve leaflet calcifications. The ascending thoracic aorta is enlarged in caliber measuring up to 4.5 cm. The descending thoracic aorta is normal in caliber. Mild atherosclerotic plaque of the thoracic aorta. Possible mild left anterior descending coronary artery calcification. Mediastinum/Nodes: There is a 1.1 cm infra cardiac enlarged round lymph node (2:127). Enlarged mediastinal lymph nodes with as an example a right 1 cm paratracheal lymph node (2: 44) and a enlarged 1.1 cm subcarinal lymph node (2:65). Limited sensitivity for the detection of hilar adenopathy on this noncontrast study. No axillary lymphadenopathy bilaterally. Lungs/Pleura: At least mild paraseptal and severe centrilobular emphysematous changes. Almost complete collapse of the left upper and lower lobes. There is a 1 cm solid round pulmonary nodule within the right middle lobe (4:83,99). Slightly more inferior, there there are a 0.6 cm right middle lobe pulmonary nodules (4:89). Several pulmonary micronodules at the right apex (4:17, 24, 31). Couple of round pulmonary nodule within the right lower lobe measuring 0.5 and 0.6 cm (4:1 16-117). Ground-glass subpleural nodule measuring 0.7 cm in the right middle lobe (4:96). Peribronchovascular nodular like 3.2 cm ground-glass airspace opacity within the right lower lobe (4:111). Diffuse mild bronchial wall thickening. Moderate to large volume simple fluid left pleural effusion. No right pleural effusion. No pneumothorax. Upper Abdomen: Suggestion  of a partially  visualized splenule. Otherwise no acute abnormality. Musculoskeletal: Indeterminate 1 cm subcutaneus soft tissue density within the left anterolateral back at the level of scapula (2:55). Several other subcentimeter soft tissue densities within the left lower back (2:68, 99). Possible seroma findings that is collimated off view within the right anterolateral back (2:52). Sclerotic thoracic vertebra lesions within the T6, T9, T11, T12 vertebral bodies. Possible tiny sclerotic foci within the left aspect of the T7 vertebral body (6:103). Similarly within the left aspect of the T4 and T5 vertebral bodies. Sclerotic lesion within the right transverse process of the T3 vertebral body (4:10). No acute displaced fracture. Multilevel mild degenerative changes of the spine. IMPRESSION: 1. Constellation of findings consistent with metastatic malignancy. 2. Moderate to large volume left pleural effusion with almost complete collapse of the left upper and lower lobes. Associated mediastinal lymphadenopathy and limited evaluation for hilar lymphadenopathy on this noncontrast study. Underlying malignancy suspected (ddx includes metastasis or primary lung). Consider analysis of the pleural fluid for further evaluation. 3. Indeterminate multifocal right lung pulmonary nodules measuring up to 1 cm in the right middle lobe. 4. Indeterminate 3.2 cm peribronchovascular ground-glass airspace opacity within the right lower lobe. 5. Scattered suspicious sclerotic lesions concerning for osseous metastases. 6. Indeterminate pericentimeter subcutaneus soft tissue densities within the bilateral anterolateral back at the level of scapulas. 7. Severe emphysematous changes. 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 9. Aortic Atherosclerosis (ICD10-I70.0) and Emphysema (ICD10-J43.9). Electronically Signed   By: Iven Finn M.D.   On: 09/29/2020 17:19    Pending Labs Unresulted Labs (From admission, onward)          Start      Ordered   09/30/20 0500  Heparin level (unfractionated)  Daily,   R      09/29/20 1813   09/30/20 0500  CBC  Daily,   R      09/29/20 1813   09/30/20 0100  Heparin level (unfractionated)  Once-Timed,   STAT        09/29/20 1813   09/29/20 1624  Urine culture  (Septic presentation on arrival (screening labs, nursing and treatment orders for obvious sepsis))  ONCE - STAT,   STAT        09/29/20 1624          Vitals/Pain Today's Vitals   09/29/20 1537 09/29/20 1546 09/29/20 1700 09/29/20 1830  BP: (!) 171/122  (!) 152/112 (!) 138/109  Pulse: (!) 126  (!) 131 (!) 124  Resp: (!) 22  (!) 22 (!) 22  Temp:      TempSrc:      SpO2: 92% 91% 97% 96%  Weight:      Height:      PainSc:        Isolation Precautions No active isolations  Medications Medications  lactated ringers infusion ( Intravenous New Bag/Given 09/29/20 1731)  cefTRIAXone (ROCEPHIN) 2 g in sodium chloride 0.9 % 100 mL IVPB (2 g Intravenous New Bag/Given 09/29/20 1730)  azithromycin (ZITHROMAX) 500 mg in sodium chloride 0.9 % 250 mL IVPB (500 mg Intravenous New Bag/Given 09/29/20 1731)  heparin ADULT infusion 100 units/mL (25000 units/214mL sodium chloride 0.45%) (1,100 Units/hr Intravenous New Bag/Given 09/29/20 1850)  heparin bolus via infusion 4,000 Units (4,000 Units Intravenous Bolus from Bag 09/29/20 1845)    Mobility walks Low fall risk   Focused Assessments    R Recommendations: See Admitting Provider Note  Report given to:   Additional Notes:

## 2020-09-29 NOTE — ED Triage Notes (Signed)
Pt to er, pt states that he has been sob for the past few months, states that it has recently gotten worse.  States that he was tested for covid last Saturday and Tuesday he got the result that he was negative, states that he has been to urgent care several times and they have given him prednisone.

## 2020-09-29 NOTE — ED Provider Notes (Signed)
[facility] EMERGENCY DEPARTMENT Provider Note   CSN: [id number] Arrival date & time: 09/29/20  1505     History Chief Complaint  Patient presents with  . Shortness of Breath    Brett Medina is a 59 y.o. male.  HPI   This patient is a 60 year old male, he denies taking any daily medications, likely has history of hypertension, does not follow with a family doctor.  He is not a diabetic and is never had heart or lung disease diagnosed.  He smoked for approximately 45 years but stopped smoking a couple of months ago when he started to get short of breath.  This was gradual in onset and has been persistent over the last 2 months.  He has been seen at the urgent care at an outside hospital where was tested for Covid this week and it was negative.  He has been taking medication such as prednisone to help with his shortness of breath but it does not seem to help.  He has some associated poor appetite but is not had weight loss or weight gain, he has some mild persistent swelling of his legs.  The shortness of breath is now become severe and he notes that he can only take 4 or 5 steps without becoming so dyspneic that he has to stop.  This is very unusual for him and he has never had there is dyspnea on exertion in the past.  He has no chest pain and is able to lay down at night and sleep through the whole night without getting short of breath.  There is no cough, no fever, no nausea vomiting or diarrhea, no other symptoms.  History reviewed. No pertinent past medical history.  There are no problems to display for this patient.   History reviewed. No pertinent surgical history.     History reviewed. No pertinent family history.  Social History   Tobacco Use  . Smoking status: Former Smoker    Types: Cigarettes    Quit date: 07/03/2020    Years since quitting: 0.2  . Smokeless tobacco: Never Used  Substance Use Topics  . Alcohol use: Yes  . Drug use: Not Currently    Types:  Marijuana    Home Medications Prior to Admission medications   Not on File    Allergies    Patient has no known allergies.  Review of Systems   Review of Systems  Constitutional: Negative for chills and fever.  HENT: Negative for sore throat.   Eyes: Negative for visual disturbance.  Respiratory: Positive for shortness of breath. Negative for cough and chest tightness.   Cardiovascular: Positive for leg swelling. Negative for chest pain.  Gastrointestinal: Negative for abdominal pain, diarrhea, nausea and vomiting.  Genitourinary: Negative for dysuria and frequency.  Musculoskeletal: Negative for back pain and neck pain.  Skin: Negative for rash.  Neurological: Negative for weakness, numbness and headaches.  Hematological: Negative for adenopathy.  Psychiatric/Behavioral: Negative for behavioral problems.    Physical Exam Updated Vital Signs BP (!) 171/122 (BP Location: Right Arm)   Pulse (!) 126   Temp 98.4 F (36.9 C) (Oral)   Resp (!) 22   Ht 1.753 m (5\' 9" )   Wt 99.8 kg   SpO2 91%   BMI 32.49 kg/m   Physical Exam Vitals and nursing note reviewed.  Constitutional:      General: He is in acute distress.     Appearance: He is well-developed.  HENT:  Head: Normocephalic and atraumatic.     Mouth/Throat:     Pharynx: No oropharyngeal exudate.  Eyes:     General: No scleral icterus.       Right eye: No discharge.        Left eye: No discharge.     Conjunctiva/sclera: Conjunctivae normal.     Pupils: Pupils are equal, round, and reactive to light.  Neck:     Thyroid: No thyromegaly.     Vascular: No JVD.  Cardiovascular:     Rate and Rhythm: Regular rhythm. Tachycardia present.     Heart sounds: Normal heart sounds. No murmur heard.  No friction rub. No gallop.      Comments: No JVD, great pulses at the radial arteries bilaterally, mild bilateral lower extremity edema Pulmonary:     Effort: Respiratory distress present.     Breath sounds: No wheezing  or rales.     Comments: There is no wheezing or rales, the patient has absent lungs in the complete left side of the chest.  His left hemithorax is dull to percussion in its entirety, the right side appears normal without abnormal lung sounds, trachea is midline Abdominal:     General: Bowel sounds are normal. There is no distension.     Palpations: Abdomen is soft. There is no mass.     Tenderness: There is no abdominal tenderness.  Musculoskeletal:        General: No tenderness. Normal range of motion.     Cervical back: Normal range of motion and neck supple.     Right lower leg: Edema present.     Left lower leg: Edema present.  Lymphadenopathy:     Cervical: No cervical adenopathy.  Skin:    General: Skin is warm and dry.     Findings: No erythema or rash.  Neurological:     Mental Status: He is alert.     Coordination: Coordination normal.  Psychiatric:        Behavior: Behavior normal.     ED Results / Procedures / Treatments   Labs (all labs ordered are listed, but only abnormal results are displayed) Labs Reviewed  CULTURE, BLOOD (ROUTINE X 2)  CULTURE, BLOOD (ROUTINE X 2)  URINE CULTURE  RESP PANEL BY RT-PCR (FLU A&B, COVID) ARPGX2  LACTIC ACID, PLASMA  LACTIC ACID, PLASMA  COMPREHENSIVE METABOLIC PANEL  CBC WITH DIFFERENTIAL/PLATELET  PROTIME-INR  APTT  URINALYSIS, ROUTINE W REFLEX MICROSCOPIC    EKG EKG Interpretation  Date/Time:  Sunday September 29 2020 15:36:33 EST Ventricular Rate:  128 PR Interval:    QRS Duration: 83 QT Interval:  316 QTC Calculation: 462 R Axis:   52 Text Interpretation: Sinus tachycardia Atrial premature complex Nonspecific T abnormalities, lateral leads No old tracing to compare Confirmed by Noemi Chapel [phone]) on 09/29/2020 4:13:03 PM   Radiology DG Chest 2 View  Result Date: 09/29/2020 CLINICAL DATA:  Shortness of breath EXAM: CHEST - 2 VIEW COMPARISON:  October 27, 2004 FINDINGS: There is essentially complete  consolidation of the left hemithorax with probable combination of pleural effusion and extensive airspace consolidation throughout the left lung. There is a nodular opacity in the periphery of the right mid lung measuring 1.0 x 0.9 cm. There is interstitial prominence on the right which in part may be due to redistribution of blood flow to viable lung segments. Heart size appears grossly normal. Pulmonary vascularity on the right is normal. No adenopathy is evident in areas that can be assessed  for potential adenopathy by radiography. Note that opacification on the left precludes assessment for potential adenopathy on the left. No bone lesions. IMPRESSION: Widespread airspace opacity on the left, likely due to combination of consolidation and pleural effusion. Underlying neoplasm on the left cannot be excluded. Given this circumstance, correlation with contrast enhanced chest CT may well be advisable at this time to further evaluate Nodular opacity periphery of right mid lung concerning for potential small neoplastic focus. Interstitial prominence on the right may in large part be due to redistribution of blood flow to viable lung segments. Heart size within normal limits. Electronically Signed   By: Lowella Grip III M.D.   On: 09/29/2020 16:13    Procedures .Critical Care Performed by: Noemi Chapel, MD Authorized by: Noemi Chapel, MD   Critical care provider statement:    Critical care time (minutes):  55   Critical care time was exclusive of:  Separately billable procedures and treating other patients and teaching time   Critical care was necessary to treat or prevent imminent or life-threatening deterioration of the following conditions:  Respiratory failure and cardiac failure   Critical care was time spent personally by me on the following activities:  Blood draw for specimens, development of treatment plan with patient or surrogate, discussions with consultants, evaluation of patient's  response to treatment, examination of patient, obtaining history from patient or surrogate, ordering and performing treatments and interventions, ordering and review of laboratory studies, ordering and review of radiographic studies, pulse oximetry, re-evaluation of patient's condition and review of old charts   (including critical care time)  Medications Ordered in ED Medications  lactated ringers infusion (has no administration in time range)  cefTRIAXone (ROCEPHIN) 2 g in sodium chloride 0.9 % 100 mL IVPB (has no administration in time range)  azithromycin (ZITHROMAX) 500 mg in sodium chloride 0.9 % 250 mL IVPB (has no administration in time range)    ED Course  I have reviewed the triage vital signs and the nursing notes.  Pertinent labs & imaging results that were available during my care of the patient were reviewed by me and considered in my medical decision making (see chart for details).    MDM Rules/Calculators/A&P                          This patient is ill-appearing, very tachycardic, tachypneic and hypoxic requiring 2 L of nasal cannula oxygen.  Blood pressure is 170/120.  He is in need of treatment for his underlying hypertension but the big issue today is that is what appears to be a large pleural effusion and/or infection and/or malignancy in the left side of his chest.  Given his longtime history of smoking cancer must be on the differential.  CT scan will be ordered, he has an allergy to IV dye, it will be performed noncontrast.  Anticipate the patient will need to be admitted, due to his tachycardia and hypoxia he meets SIRS criteria and if there is an underlying infection we will treat for possible sepsis.  He does not have any shock pathology at this time.  Pending lactic acid and cultures.  Pt continues to be tachycardic, Has O2 that is borderline at 91% on RA Has large effusion with associated likely underlying metastatic CA and complete left lung collapse -  Has  NSTEMI based on his trop of > 1000, likely stress induced - consider PE given tachycardia, SOB and CA,  Consult with hospitalist for  admission Heparin drip, pt is critically ill. Dr. Denton Brick to facilitate transfer to [facility] at higher level of care  Final Clinical Impression(s) / ED Diagnoses Final diagnoses:  Large pleural effusion  NSTEMI (non-ST elevated myocardial infarction) [facility])  Metastatic malignant neoplasm, unspecified site [facility])      Noemi Chapel, MD 09/30/20 1600

## 2020-09-29 NOTE — Progress Notes (Addendum)
ANTICOAGULATION CONSULT NOTE - Initial Consult  Pharmacy Consult for heparin gtt  Indication: chest pain/ACS  No Known Allergies  Patient Measurements: Height: 5\' 9"  (175.3 cm) Weight: 99.8 kg (220 lb) IBW/kg (Calculated) : 70.7 Heparin Dosing Weight: HEPARIN DW (KG): 91.8   Vital Signs: Temp: 98.4 F (36.9 C) (11/28 1520) Temp Source: Oral (11/28 1520) BP: 152/112 (11/28 1700) Pulse Rate: 131 (11/28 1700)  Labs: Recent Labs    09/29/20 1635  HGB 14.1  HCT 44.2  PLT 306  APTT 28  LABPROT 14.3  INR 1.2  CREATININE 1.14    Estimated Creatinine Clearance: 82.2 mL/min (by C-G formula based on SCr of 1.14 mg/dL).   Medical History: History reviewed. No pertinent past medical history.  Medications:  (Not in a hospital admission)  Scheduled:  . heparin  4,000 Units Intravenous Once   Infusions:  . azithromycin 500 mg (09/29/20 1731)  . cefTRIAXone (ROCEPHIN)  IV 2 g (09/29/20 1730)  . heparin    . lactated ringers 150 mL/hr at 09/29/20 1731   PRN:  Anti-infectives (From admission, onward)   Start     Dose/Rate Route Frequency Ordered Stop   09/29/20 1630  cefTRIAXone (ROCEPHIN) 2 g in sodium chloride 0.9 % 100 mL IVPB        2 g 200 mL/hr over 30 Minutes Intravenous Every 24 hours 09/29/20 1624     09/29/20 1630  azithromycin (ZITHROMAX) 500 mg in sodium chloride 0.9 % 250 mL IVPB        500 mg 250 mL/hr over 60 Minutes Intravenous Every 24 hours 09/29/20 1624        Assessment: Brett Medina a 59 y.o. male requires anticoagulation with a heparin iv infusion for the indication of  chest pain/ACS. Heparin gtt will be started following pharmacy protocol per pharmacy consult. Patient is not on previous oral anticoagulant that will require aPTT/HL correlation before transitioning to only HL monitoring.   Goal of Therapy:  Heparin level 0.3-0.7 units/ml Monitor platelets by anticoagulation protocol: Yes   Plan:  Give 4000 units bolus x 1 Start heparin  infusion at 1100 units/hr Check anti-Xa level in 6 hours and daily while on heparin Continue to monitor H&H and platelets  Heparin level to be drawn in 6 hours.  Donna Christen Sarvesh Meddaugh 09/29/2020,6:13 PM

## 2020-09-29 NOTE — ED Notes (Signed)
Report given to Donna, RN.

## 2020-09-29 NOTE — Progress Notes (Signed)
Patient arrived from [place] by [place]. Patient placed on monitor, CCMD notified. Vitals taken, yellow MEWS started. Patient oriented to room and equipment.

## 2020-09-29 NOTE — ED Notes (Signed)
Pt O2 saturations 90-91%, nasal cannula applied @ 2 liters

## 2020-09-29 NOTE — ED Notes (Signed)
Report given to [facility]. 

## 2020-09-29 NOTE — Plan of Care (Signed)
  Problem: Health Behavior/Discharge Planning: Goal: Ability to manage health-related needs will improve Outcome: Not Progressing   

## 2020-09-30 ENCOUNTER — Inpatient Hospital Stay (HOSPITAL_COMMUNITY): Payer: Self-pay

## 2020-09-30 ENCOUNTER — Other Ambulatory Visit: Payer: Self-pay

## 2020-09-30 DIAGNOSIS — J90 Pleural effusion, not elsewhere classified: Secondary | ICD-10-CM

## 2020-09-30 DIAGNOSIS — R0600 Dyspnea, unspecified: Secondary | ICD-10-CM

## 2020-09-30 DIAGNOSIS — J9601 Acute respiratory failure with hypoxia: Secondary | ICD-10-CM

## 2020-09-30 DIAGNOSIS — R7989 Other specified abnormal findings of blood chemistry: Secondary | ICD-10-CM

## 2020-09-30 HISTORY — PX: IR THORACENTESIS ASP PLEURAL SPACE W/IMG GUIDE: IMG5380

## 2020-09-30 LAB — BASIC METABOLIC PANEL
Anion gap: 10 (ref 5–15)
BUN: 12 mg/dL (ref 6–20)
CO2: 26 mmol/L (ref 22–32)
Calcium: 9 mg/dL (ref 8.9–10.3)
Chloride: 100 mmol/L (ref 98–111)
Creatinine, Ser: 1.35 mg/dL — ABNORMAL HIGH (ref 0.61–1.24)
GFR, Estimated: >60 mL/min (ref >60)
Glucose, Bld: 110 mg/dL — ABNORMAL HIGH (ref 70–99)
Potassium: 4.4 mmol/L (ref 3.5–5.1)
Sodium: 136 mmol/L (ref 135–145)

## 2020-09-30 LAB — GLUCOSE, PLEURAL OR PERITONEAL FLUID: Glucose, Fluid: <20 mg/dL

## 2020-09-30 LAB — ALBUMIN, PLEURAL OR PERITONEAL FLUID: Albumin, Fluid: 1.5 g/dL

## 2020-09-30 LAB — AMYLASE, PLEURAL OR PERITONEAL FLUID: Amylase, Fluid: 106 U/L

## 2020-09-30 LAB — HEMOGLOBIN A1C
Hgb A1c MFr Bld: 5.7 % — ABNORMAL HIGH (ref 4.8–5.6)
Mean Plasma Glucose: 116.89 mg/dL

## 2020-09-30 LAB — ECHOCARDIOGRAM COMPLETE
Area-P 1/2: 9.85 cm2
Calc EF: 35 %
Height: 69 in
S' Lateral: 4.4 cm
Single Plane A2C EF: 33.1 %
Single Plane A4C EF: 37.8 %
Weight: 3428.59 oz

## 2020-09-30 LAB — LIPID PANEL
Cholesterol: 141 mg/dL (ref 0–200)
HDL: 37 mg/dL — ABNORMAL LOW (ref >40)
LDL Cholesterol: 85 mg/dL (ref 0–99)
Total CHOL/HDL Ratio: 3.8 RATIO
Triglycerides: 94 mg/dL (ref <150)
VLDL: 19 mg/dL (ref 0–40)

## 2020-09-30 LAB — HEPARIN LEVEL (UNFRACTIONATED)
Heparin Unfractionated: <0.1 IU/mL — ABNORMAL LOW (ref 0.30–0.70)
Heparin Unfractionated: 0.16 IU/mL — ABNORMAL LOW (ref 0.30–0.70)
Heparin Unfractionated: 0.21 IU/mL — ABNORMAL LOW (ref 0.30–0.70)

## 2020-09-30 LAB — PROTEIN, PLEURAL OR PERITONEAL FLUID: Total protein, fluid: 3.4 g/dL

## 2020-09-30 LAB — CBC
HCT: 41.6 % (ref 39.0–52.0)
Hemoglobin: 13.2 g/dL (ref 13.0–17.0)
MCH: 24.9 pg — ABNORMAL LOW (ref 26.0–34.0)
MCHC: 31.7 g/dL (ref 30.0–36.0)
MCV: 78.3 fL — ABNORMAL LOW (ref 80.0–100.0)
Platelets: 278 10*3/uL (ref 150–400)
RBC: 5.31 MIL/uL (ref 4.22–5.81)
RDW: 17.1 % — ABNORMAL HIGH (ref 11.5–15.5)
WBC: 10.8 10*3/uL — ABNORMAL HIGH (ref 4.0–10.5)
nRBC: 0 % (ref 0.0–0.2)

## 2020-09-30 LAB — HIV ANTIBODY (ROUTINE TESTING W REFLEX): HIV Screen 4th Generation wRfx: NONREACTIVE

## 2020-09-30 LAB — TROPONIN I (HIGH SENSITIVITY): Troponin I (High Sensitivity): 1446 ng/L (ref <18)

## 2020-09-30 LAB — LACTATE DEHYDROGENASE, PLEURAL OR PERITONEAL FLUID: LD, Fluid: 4038 U/L — ABNORMAL HIGH (ref 3–23)

## 2020-09-30 LAB — TSH: TSH: 1.901 u[IU]/mL (ref 0.350–4.500)

## 2020-09-30 MED ORDER — PERFLUTREN LIPID MICROSPHERE
1.0000 mL | INTRAVENOUS | Status: AC | PRN
  Administered 2020-09-30: 4 mL via INTRAVENOUS
  Filled 2020-09-30: qty 10

## 2020-09-30 MED ORDER — METOPROLOL TARTRATE 12.5 MG HALF TABLET
12.5000 mg | ORAL_TABLET | Freq: Two times a day (BID) | ORAL | Status: DC
  Administered 2020-09-30 – 2020-10-01 (×3): 12.5 mg via ORAL
  Filled 2020-09-30 (×3): qty 1

## 2020-09-30 MED ORDER — LIDOCAINE HCL 1 % IJ SOLN
INTRAMUSCULAR | Status: DC | PRN
  Administered 2020-09-30: 10 mL

## 2020-09-30 MED ORDER — LIDOCAINE HCL 1 % IJ SOLN
INTRAMUSCULAR | Status: AC
  Filled 2020-09-30: qty 20

## 2020-09-30 MED ORDER — ASPIRIN 325 MG PO TABS
325.0000 mg | ORAL_TABLET | Freq: Every day | ORAL | Status: DC
  Administered 2020-09-30: 325 mg via ORAL
  Filled 2020-09-30: qty 1

## 2020-09-30 MED ORDER — HEPARIN BOLUS VIA INFUSION
3000.0000 [IU] | Freq: Once | INTRAVENOUS | Status: AC
  Administered 2020-09-30: 3000 [IU] via INTRAVENOUS
  Filled 2020-09-30: qty 3000

## 2020-09-30 MED ORDER — ATORVASTATIN CALCIUM 10 MG PO TABS
20.0000 mg | ORAL_TABLET | Freq: Every day | ORAL | Status: DC
  Administered 2020-10-01 – 2020-10-09 (×9): 20 mg via ORAL
  Filled 2020-09-30 (×10): qty 2

## 2020-09-30 MED ORDER — ASPIRIN EC 81 MG PO TBEC
81.0000 mg | DELAYED_RELEASE_TABLET | Freq: Every day | ORAL | Status: DC
  Administered 2020-10-01 – 2020-10-15 (×14): 81 mg via ORAL
  Filled 2020-09-30 (×16): qty 1

## 2020-09-30 NOTE — Progress Notes (Signed)
ANTICOAGULATION CONSULT NOTE - Follow Up Consult  Pharmacy Consult for Heparin Indication: elevated troponin and rule out PE  Allergies  Allergen Reactions  . Contrast Media [Iodinated Diagnostic Agents]     Flushing, warmth, even with pre-medication    Patient Measurements: Height: 5\' 9"  (175.3 cm) Weight: 97.2 kg (214 lb 4.6 oz) IBW/kg (Calculated) : 70.7 Heparin Dosing Weight: 91 kg  Vital Signs: Temp: 98.4 F (36.9 C) (11/29 1103) Temp Source: Oral (11/29 1103) BP: 154/108 (11/29 1636) Pulse Rate: 111 (11/29 1636)  Labs: Recent Labs    09/29/20 1635 09/29/20 2032 09/30/20 0213 09/30/20 1000 09/30/20 1736  HGB 14.1  --  13.2  --   --   HCT 44.2  --  41.6  --   --   PLT 306  --  278  --   --   APTT 28  --   --   --   --   LABPROT 14.3  --   --   --   --   INR 1.2  --   --   --   --   HEPARINUNFRC  --   --  <0.10* 0.16* 0.21*  CREATININE 1.14  --  1.35*  --   --   TROPONINIHS 1,087* 1,380* 1,446*  --   --     Estimated Creatinine Clearance: 68.6 mL/min (A) (by C-G formula based on SCr of 1.35 mg/dL (H)).  Assessment:  59 yr old male admitted 11/28 pm with shortness of breath. Found to have large volume left pleural effusion, concern for metastatic malignancy. Refused CT to rule out PE due to reported hx contrast allergy even with premedication.   Started on IV 11/28 pm.      Initial heparin level undetectable (<0.10) > received 3000 units IV bolus and rate increased from 1100 to 1500 units/hr ~4:30am.  Level this morning remains subtherapeutic (0.16).  S/p thoracentesis this morning.  No interruptions of infusion noted.  No chest pain reported.     -  950 ml dark old blood removed with thoracentesis  PM update: Heparin level 0.21, Increase rate with no bolus due to thoracentesis this AM  Goal of Therapy:  Heparin level 0.3-0.7 units/ml Monitor platelets by anticoagulation protocol: Yes   Plan:   Increase heparin drip to 1950 units/hr.  No bolus with recent  thoracentesis.  Heparin level ~6 hrs after rate change.  Daily heparin level and CBC while on heparin.   Lunden Stieber A. Levada Dy, PharmD, BCPS, FNKF Clinical Pharmacist [place] Please utilize Amion for appropriate phone number to reach the unit pharmacist ([place])   09/30/2020,7:05 PM

## 2020-09-30 NOTE — Progress Notes (Signed)
Increase O2 4L [place] to maintain sats 92% or better.

## 2020-09-30 NOTE — Progress Notes (Signed)
Encouraged patient to get out of bed to chair for supper. Patient declines at this time. Patient stated would try in the morning. Will monitor. Soyla Bainter, Bettina Gavia Rn

## 2020-09-30 NOTE — Progress Notes (Signed)
ANTICOAGULATION CONSULT NOTE - Follow Up Consult  Pharmacy Consult for heparin Indication: elevated troponin  Labs: Recent Labs    09/29/20 1635 09/29/20 2032 09/30/20 0213  HGB 14.1  --  13.2  HCT 44.2  --  41.6  PLT 306  --  278  APTT 28  --   --   LABPROT 14.3  --   --   INR 1.2  --   --   HEPARINUNFRC  --   --  <0.10*  CREATININE 1.14  --   --   TROPONINIHS 1,087* 1,380*  --     Assessment: 59yo male subtherapeutic on heparin with initial dosing for elevated troponin; no gtt issues or signs of bleeding per RN.  Goal of Therapy:  Heparin level 0.3-0.7 units/ml   Plan:  Will rebolus with heparin 3000 units and increase heparin gtt by 4 units/kg/hr to 1500 units/hr and check level in 6 hours.    Wynona Neat, PharmD, BCPS  09/30/2020,3:54 AM

## 2020-09-30 NOTE — Consult Note (Addendum)
Cardiology Consultation:   Patient ID: Brett Medina MRN: [id number]; DOB: 1961/03/16  Admit date: 09/29/2020 Date of Consult: 09/30/2020  Primary Care Provider: Patient, No Pcp Per Brett Medina Cardiologist: New  Patient Profile:   Brett Medina is a 59 y.o. male with no significant past medical history except for tobacco smoking and no regular primary care follow-up who is being seen today for the evaluation of elevated troponin at the request of Dr. Nevada Crane.  History of Present Illness:   Brett Medina presented to emergency room yesterday with 68-months history of progressive worsening dyspnea on exertion, orthopnea, PND and lower extremity edema.  He denies any exertional chest tightness.  He was prescribed prednisone at urgent care couple of weeks ago.  He stopped smoking tobacco when his symptoms started.  He has at least 40-pack-year history of tobacco smoking.  He was found tachycardic and tachypneic.  Lactic acid 1.3.  Chest x-ray showed pleural effusion with concern for malignancy.  He underwent CT of chest without contrast (reported dye allergy despite medication previously) with findings consistent with metastatic malignancy.  Moderate to large left pleural effusion with complete collapse of left upper and lower lobe.  He has hilar lymphadenopathy.  He underwent ultrasound-guided left thoracentesis yielding 950 mL of pleural fluid.  Pending blood and fluid culture.  Repeat chest x-ray showed some improvement in lung aeration and persistent large left pleural effusion.  Plan for repeat thoracentesis this afternoon.  He is started on IV heparin prophylactically for possible pulmonary embolism given tachycardia.  BP is elevated. He has declined CT with contrast.  Cardiology is asked for evaluation of elevated Hs-troponin 1087>>[id number]>>1446. Denies chest pain, palpitations, dizziness, syncope or melena. Denies recent travel or surgery. However has sedentary lifestyle.  Father died of testicular  cancer.  Mother and sister has tachycardia and heart disease.  Unable to provide further information.   History reviewed. No pertinent past medical history.  Past Surgical History:  Procedure Laterality Date  . IR THORACENTESIS ASP PLEURAL SPACE W/IMG GUIDE  09/30/2020     Inpatient Medications: Scheduled Meds: . [START ON 10/01/2020] aspirin EC  81 mg Oral Daily  . [START ON 10/01/2020] atorvastatin  20 mg Oral Daily  . lidocaine       Continuous Infusions: . heparin 1,750 Units/hr (09/30/20 1223)   PRN Meds: acetaminophen **OR** acetaminophen, labetalol, lidocaine, ondansetron **OR** ondansetron (ZOFRAN) IV, polyethylene glycol  Allergies:    Allergies  Allergen Reactions  . Contrast Media [Iodinated Diagnostic Agents]     Flushing, warmth, even with pre-medication    Social History:   Social History   Socioeconomic History  . Marital status: Married    Spouse name: Not on file  . Number of children: Not on file  . Years of education: Not on file  . Highest education level: Not on file  Occupational History  . Not on file  Tobacco Use  . Smoking status: Former Smoker    Types: Cigarettes    Quit date: 07/03/2020    Years since quitting: 0.2  . Smokeless tobacco: Never Used  Substance and Sexual Activity  . Alcohol use: Yes  . Drug use: Not Currently    Types: Marijuana  . Sexual activity: Not on file  Other Topics Concern  . Not on file  Social History Narrative  . Not on file   Social Determinants of Health   Financial Resource Strain:   . Difficulty of Paying Living Expenses: Not on file  Food Insecurity:   .  Worried About Charity fundraiser in the Last Year: Not on file  . Ran Out of Food in the Last Year: Not on file  Transportation Needs:   . Lack of Transportation (Medical): Not on file  . Lack of Transportation (Non-Medical): Not on file  Physical Activity:   . Days of Exercise per Week: Not on file  . Minutes of Exercise per Session: Not on  file  Stress:   . Feeling of Stress : Not on file  Social Connections:   . Frequency of Communication with Friends and Family: Not on file  . Frequency of Social Gatherings with Friends and Family: Not on file  . Attends Religious Services: Not on file  . Active Member of Clubs or Organizations: Not on file  . Attends Archivist Meetings: Not on file  . Marital Status: Not on file  Intimate Partner Violence:   . Fear of Current or Ex-Partner: Not on file  . Emotionally Abused: Not on file  . Physically Abused: Not on file  . Sexually Abused: Not on file    Family History:   As above  ROS:  Please see the history of present illness.  All other ROS reviewed and negative.     Physical Exam/Data:   Vitals:   09/30/20 0800 09/30/20 0900 09/30/20 1103 09/30/20 1200  BP: (!) 152/116 (!) 145/102 (!) 151/111 (!) 124/98  Pulse: (!) 117  (!) 121 (!) 105  Resp: 20  19 15   Temp: 99 F (37.2 C)  98.4 F (36.9 C)   TempSrc: Oral  Oral   SpO2: 96%  99% 97%  Weight:      Height:        Intake/Output Summary (Last 24 hours) at 09/30/2020 1335 Last data filed at 09/30/2020 0800 Gross per 24 hour  Intake 1945.25 ml  Output 1500 ml  Net 445.25 ml   Last 3 Weights 09/29/2020 09/29/2020  Weight (lbs) 214 lb 4.6 oz 220 lb  Weight (kg) 97.2 kg 99.791 kg     Body mass index is 31.64 kg/m.  General:  Well nourished, well developed, in no acute distress HEENT: normal Lymph: no adenopathy Neck: no JVD Endocrine:  No thryomegaly Vascular: No carotid bruits; FA pulses 2+ bilaterally without bruits  Cardiac:  normal S1, S2; Regular tachycardia; no murmur Lungs: Diminished breath sound on LUL. Heard to hear any movement on LLL.  Abd: soft, nontender, no hepatomegaly  Ext: Trace edema bilaterally Musculoskeletal:  No deformities, BUE and BLE strength normal and equal Skin: warm and dry  Neuro:  CNs 2-12 intact, no focal abnormalities noted Psych:  Normal affect   EKG:  The  EKG was personally reviewed and demonstrates:  Sinus tachycardia at 128 bpm Telemetry:  Telemetry was personally reviewed and demonstrates:  Sinus tachycardia   Relevant CV Studies: Pending echo   Laboratory Data:  High Sensitivity Troponin:   Recent Labs  Lab 09/29/20 1635 09/29/20 2032 09/30/20 0213  TROPONINIHS 1,087* 1,380* 1,446*     Chemistry Recent Labs  Lab 09/29/20 1635 09/30/20 0213  NA 132* 136  K 3.7 4.4  CL 100 100  CO2 22 26  GLUCOSE 113* 110*  BUN 13 12  CREATININE 1.14 1.35*  CALCIUM 9.0 9.0  GFRNONAA >60 >60  ANIONGAP 10 10    Recent Labs  Lab 09/29/20 1635  PROT 6.3*  ALBUMIN 2.7*  AST 33  ALT 29  ALKPHOS 90  BILITOT 0.6   Hematology Recent Labs  Lab 09/29/20 1635 09/30/20 0213  WBC 13.2* 10.8*  RBC 5.62 5.31  HGB 14.1 13.2  HCT 44.2 41.6  MCV 78.6* 78.3*  MCH 25.1* 24.9*  MCHC 31.9 31.7  RDW 17.8* 17.1*  PLT 306 278   BNP Recent Labs  Lab 09/29/20 1635  BNP 205.0*    Radiology/Studies:  DG Chest 1 View  Result Date: 09/30/2020 CLINICAL DATA:  Post thoracentesis EXAM: CHEST  1 VIEW COMPARISON:  09/29/2020 FINDINGS: Slightly decreased, still large left pleural effusion. Some improvement in left lung aeration primarily at the apex. No pneumothorax. Stable nodule overlying the mid right lung. Unchanged mild right basilar interstitial prominence. Similar cardiomediastinal contours. IMPRESSION: Some improvement of left lung aeration after thoracentesis. No pneumothorax. Persistent large left pleural effusion with associated atelectasis and possible underlying consolidation. Unchanged right basilar interstitial prominence and right mid lung nodule. Electronically Signed   By: Macy Mis M.D.   On: 09/30/2020 09:48   DG Chest 2 View  Result Date: 09/29/2020 CLINICAL DATA:  Shortness of breath EXAM: CHEST - 2 VIEW COMPARISON:  October 27, 2004 FINDINGS: There is essentially complete consolidation of the left hemithorax with  probable combination of pleural effusion and extensive airspace consolidation throughout the left lung. There is a nodular opacity in the periphery of the right mid lung measuring 1.0 x 0.9 cm. There is interstitial prominence on the right which in part may be due to redistribution of blood flow to viable lung segments. Heart size appears grossly normal. Pulmonary vascularity on the right is normal. No adenopathy is evident in areas that can be assessed for potential adenopathy by radiography. Note that opacification on the left precludes assessment for potential adenopathy on the left. No bone lesions. IMPRESSION: Widespread airspace opacity on the left, likely due to combination of consolidation and pleural effusion. Underlying neoplasm on the left cannot be excluded. Given this circumstance, correlation with contrast enhanced chest CT may well be advisable at this time to further evaluate Nodular opacity periphery of right mid lung concerning for potential small neoplastic focus. Interstitial prominence on the right may in large part be due to redistribution of blood flow to viable lung segments. Heart size within normal limits. Electronically Signed   By: Lowella Grip III M.D.   On: 09/29/2020 16:13   CT Chest Wo Contrast  Result Date: 09/29/2020 CLINICAL DATA:  Chest pain and shortness of breath worsening for the past 2 months. Evaluation for malignancy, infection, mass, fluid. EXAM: CT CHEST WITHOUT CONTRAST TECHNIQUE: Multidetector CT imaging of the chest was performed following the standard protocol without IV contrast. COMPARISON:  Chest x-ray 09/29/2020, chest x-ray 10/27/2004 FINDINGS: Cardiovascular: Normal heart size. Trace pericardial effusion. The aortic root appears to be normal in caliber. Suggestion of possible mild aortic valve leaflet calcifications. The ascending thoracic aorta is enlarged in caliber measuring up to 4.5 cm. The descending thoracic aorta is normal in caliber. Mild  atherosclerotic plaque of the thoracic aorta. Possible mild left anterior descending coronary artery calcification. Mediastinum/Nodes: There is a 1.1 cm infra cardiac enlarged round lymph node (2:127). Enlarged mediastinal lymph nodes with as an example a right 1 cm paratracheal lymph node (2: 44) and a enlarged 1.1 cm subcarinal lymph node (2:65). Limited sensitivity for the detection of hilar adenopathy on this noncontrast study. No axillary lymphadenopathy bilaterally. Lungs/Pleura: At least mild paraseptal and severe centrilobular emphysematous changes. Almost complete collapse of the left upper and lower lobes. There is a 1 cm solid round pulmonary nodule within  the right middle lobe (4:83,99). Slightly more inferior, there there are a 0.6 cm right middle lobe pulmonary nodules (4:89). Several pulmonary micronodules at the right apex (4:17, 24, 31). Couple of round pulmonary nodule within the right lower lobe measuring 0.5 and 0.6 cm (4:1 16-117). Ground-glass subpleural nodule measuring 0.7 cm in the right middle lobe (4:96). Peribronchovascular nodular like 3.2 cm ground-glass airspace opacity within the right lower lobe (4:111). Diffuse mild bronchial wall thickening. Moderate to large volume simple fluid left pleural effusion. No right pleural effusion. No pneumothorax. Upper Abdomen: Suggestion of a partially visualized splenule. Otherwise no acute abnormality. Musculoskeletal: Indeterminate 1 cm subcutaneus soft tissue density within the left anterolateral back at the level of scapula (2:55). Several other subcentimeter soft tissue densities within the left lower back (2:68, 99). Possible seroma findings that is collimated off view within the right anterolateral back (2:52). Sclerotic thoracic vertebra lesions within the T6, T9, T11, T12 vertebral bodies. Possible tiny sclerotic foci within the left aspect of the T7 vertebral body (6:103). Similarly within the left aspect of the T4 and T5 vertebral  bodies. Sclerotic lesion within the right transverse process of the T3 vertebral body (4:10). No acute displaced fracture. Multilevel mild degenerative changes of the spine. IMPRESSION: 1. Constellation of findings consistent with metastatic malignancy. 2. Moderate to large volume left pleural effusion with almost complete collapse of the left upper and lower lobes. Associated mediastinal lymphadenopathy and limited evaluation for hilar lymphadenopathy on this noncontrast study. Underlying malignancy suspected (ddx includes metastasis or primary lung). Consider analysis of the pleural fluid for further evaluation. 3. Indeterminate multifocal right lung pulmonary nodules measuring up to 1 cm in the right middle lobe. 4. Indeterminate 3.2 cm peribronchovascular ground-glass airspace opacity within the right lower lobe. 5. Scattered suspicious sclerotic lesions concerning for osseous metastases. 6. Indeterminate pericentimeter subcutaneus soft tissue densities within the bilateral anterolateral back at the level of scapulas. 7. Severe emphysematous changes. 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 9. Aortic Atherosclerosis (ICD10-I70.0) and Emphysema (ICD10-J43.9). Electronically Signed   By: Iven Finn M.D.   On: 09/29/2020 17:19   IR THORACENTESIS ASP PLEURAL SPACE W/IMG GUIDE  Result Date: 09/30/2020 INDICATION: Shortness of breath. Large left pleural effusion. Request for diagnostic therapeutic thoracentesis. EXAM: ULTRASOUND GUIDED LEFT THORACENTESIS MEDICATIONS: 1% plain lidocaine, 5 mL COMPLICATIONS: None immediate. PROCEDURE: An ultrasound guided thoracentesis was thoroughly discussed with the patient and questions answered. The benefits, risks, alternatives and complications were also discussed. The patient understands and wishes to proceed with the procedure. Written consent was obtained. Ultrasound was performed to localize and mark an adequate pocket of fluid in the left chest. The area  was then prepped and draped in the normal sterile fashion. 1% Lidocaine was used for local anesthesia. Under ultrasound guidance a 6 Fr Safe-T-Centesis catheter was introduced. Thoracentesis was performed. The catheter was removed and a dressing applied. FINDINGS: A total of approximately 950 mL of dark, old bloody fluid was removed. Samples were sent to the laboratory as requested by the clinical team. IMPRESSION: Successful ultrasound guided left thoracentesis yielding 950 mL of pleural fluid. Read by: Ascencion Dike PA-C Electronically Signed   By: Jerilynn Mages.  Shick M.D.   On: 09/30/2020 10:13     Assessment and Plan:   1. Elevated troponin - Suspect demand ischemia 2nd to respiratory issue - No chest pain or pressure with and without exertion  - EKG without ischemic changes - Pending echo>> see below - He does have cardiac  risk factors for underlying CAD - Continue ASA, statin and Iv heparin  - Add BB   2. Acute hypoxic respiratory failure 3. Large left pleural effusion - S/p left thoracentesis yielding 950 cc of fluid removal by IR on 11/29 -Chest x-ray today showed persistent large pleural effusion and plan for repeat thoracentesis prior to discharge. -Non contrast CT of chest (patient reported dye allergy despite premedication) concerning for malignancy  -Pending culture and cytology  4.  Sinus tachycardia -Patient denies chest pain  -His blood pressure has been stable -Given concern for pulmonary embolism and elevated troponin >>>started on IV heparin by primary team prophylactically -Pending echocardiogram to assess ventricular function bilaterally -If RV failure, at least consider VQ scan if he declines CT angio of chest - Add metoprolol 12.5mg  BID, His BP is stable  5.  Bilateral lower extremity edema -BNP minimally elevated at 250 -Pending lower extremity doppler to rule out DVT   TIMI Risk Score for Unstable Angina or Non-ST Elevation MI:   The patient's TIMI risk score is 2,  which indicates a 8% risk of all cause mortality, new or recurrent myocardial infarction or need for urgent revascularization in the next 14 days.   For questions or updates, please contact [place] Please consult [email or web address] for contact info under    Jarrett Soho, PA  09/30/2020 1:35 PM   Patient seen and examined with [facility] PA.  Agree as above, with the following exceptions and changes as noted below.  Patient's wife is present at the bedside.  Describes dyspnea on exertion progressive over 2 months with worsening PND and orthopnea as well as leg swelling.  Previous heavy smoking now quit when symptoms developed. Gen: NAD, CV: Regular rhythm and tachycardic, no murmurs, Lungs: Diminished breath sounds on the left abd: soft, Extrem: Warm, well perfused, 1+ bilateral edema, Neuro/Psych: alert and oriented x 3, normal mood and affect. All available labs, radiology testing, previous records reviewed.  He has significant left-sided pleural effusion and concern for malignancy in the setting of long smoking history.  Cytology is pending but clinical presentation strongly suspicious for malignancy.  Troponin is elevated with significant delta.  I have independently reviewed the echocardiogram images obtained today but recommend use of Definity contrast for better assessment of EF and wall motion.  I have discussed with the patient that echo contrast is not IV iodinated contrast and this should not have any cross-reactivity with his described allergy.  He is okay to receive Definity contrast.  We will repeat these images and make final comments about the echocardiogram when performed.  Noncontrast chest CT shows coronary artery calcifications, will initiate medical management of CAD with aspirin and low-dose metoprolol.  Elouise Munroe, MD 09/30/20 4:39 PM

## 2020-09-30 NOTE — Procedures (Signed)
PROCEDURE SUMMARY:  Successful US guided left thoracentesis. Yielded 950 mL of dark old bloody fluid. Pt tolerated procedure well. No immediate complications.  Specimen was sent for labs. CXR ordered.  EBL < 5 mL  Ascencion Dike PA-C 09/30/2020 9:34 AM

## 2020-09-30 NOTE — Progress Notes (Signed)
PROGRESS NOTE  Brett Medina NGE:[id number] DOB: 04-12-1961 DOA: 09/29/2020 PCP: Patient, No Pcp Per  HPI/Recap of past 24 hours: Brett Medina is a 59 y.o. male with medical history significant for tobacco abuse.  Presented to the ED with complaints of difficulty breathing ongoing over the past 2 months at least.  Reports he was seen at urgent care several x40 was given prednisone.  Patient quit smoking cigarettes about 2 months ago. He denies chest pain.  Has chronic unchanged bilateral lower extremity swelling.  Reports a productive cough over the past few weeks. No family history of heart attacks.  ED Course: O2 sats 91 to 97% on room air.  Tachycardic to 135, mild tachypnea to 22.  Lactic acid 1.3.  WBC 13.2.  Troponin 1087.  UA with moderate hemoglobin.  Chest CT-Constellation of findings consistent with metastatic malignancy, moderate to large volume left pleural effusion with almost complete collapse of left upper and lower lobes (see detailed report).  Patient reports contrast allergy, he refused CT with contrast to rule out PE as he was tachycardic.  Ceftriaxone and doxycycline given for possible pneumonia.  Heparin drip started.  Hospitalist admit for large left pleural effusion.  09/30/20: Patient was seen and examined with his wife present.  He denies any chest pain at the time of this exam.  Feels better post left thoracentesis, but conversational dyspnea noted.  950 cc of dark bloody fluid removed.  Personally reviewed chest x-ray done post left thoracentesis which showed residual moderate to large left pleural effusions, possible superimposed atelectasis.  Troponin S have been uptrending, peaked at 1400.  Cardiology consulted and will see in consultation.  He received a full-dose aspirin 325 mg.   Assessment/Plan: Principal Problem:   Large pleural effusion Active Problems:   Elevated troponin  Acute hypoxic respiratory failure secondary to large left pleural effusion, unclear  etiology, post left thoracentesis, 950 cc of fluid removed by IR on 09/30/2020 Not oxygen supplementation at baseline Fluid sent for analysis, follow results Currently requiring 3 L to maintain O2 saturation greater than 92%. Personally reviewed chest x-ray done post left thoracentesis which showed residual moderate to large left pleural effusions, possible superimposed atelectasis.  Will likely need to repeat thoracentesis.  Elevated troponin, suspect demand ischemia in the setting of acute hypoxic respiratory failure Patient denies any chest pain or any anginal symptoms. No evidence of acute ischemia on twelve-lead EKG Troponin uptrending, peaked at 1400 Received a dose of aspirin 325 mg once Currently on heparin drip-per admitting physician, patient had declined CTA chest to rule out PE due to history of allergy to IV contrast even with pretreatment. LDL 85, will start low-dose statin Cardiology has been consulted, will see in consultation. Continue to closely monitor  Large left pleural effusion, unclear etiology. 2D echo has been ordered and is pending.  Leukocytosis, downtrending, likely reactive in the setting of acute hypoxic respiratory failure with large left pleural effusion. Presented with WBC 13.2K, downtrending to 10.8K Currently not on antibiotics Continue to monitor fever curve and WBC  Persistent sinus tachycardia Obtain TSH and 2D echo -Continue IV heparin for now, his large pleural effusion likely driving his tachycardia, but if persistent tachycardia may need to rule out PE. (Currently refusing CT with contrast due to allergy reaction, even with premedication).  Chronic bilateral lower extremity edema Obtain Doppler ultrasound of lower extremities bilaterally to rule out DVT. Elevate lower extremities    Code Status: Full code  Family Communication: His wife  at bedside  Disposition Plan: Likely will DC to home when clinically  stable.   Consultants:  Cardiology  Procedures:  Left thoracentesis by IR on 09/22/2020  Antimicrobials:  None  DVT prophylaxis: Heparin drip  Status is: Inpatient    Dispo:  Patient From: Home  Planned Disposition: Home  Expected discharge date: 10/02/20  Medically stable for discharge: No, ongoing management of acute hypoxic respiratory failure, elevated troponin with heparin drip.         Objective: Vitals:   09/30/20 0743 09/30/20 0800 09/30/20 0900 09/30/20 1103  BP: (!) 162/120 (!) 152/116 (!) 145/102 (!) 151/111  Pulse: (!) 114 (!) 117  (!) 121  Resp: (!) 21 20  19   Temp: 99.4 F (37.4 C) 99 F (37.2 C)  98.4 F (36.9 C)  TempSrc: Oral Oral  Oral  SpO2: 92% 96%  99%  Weight:      Height:        Intake/Output Summary (Last 24 hours) at 09/30/2020 1204 Last data filed at 09/30/2020 0800 Gross per 24 hour  Intake 1945.25 ml  Output 1500 ml  Net 445.25 ml   Filed Weights   09/29/20 1522 09/29/20 2220  Weight: 99.8 kg 97.2 kg    Exam:  . General: 59 y.o. year-old male well developed well nourished in no acute distress.  Alert and oriented x3. . Cardiovascular: Regular rate and rhythm with no rubs or gallops.  No thyromegaly or JVD noted.   Marland Kitchen Respiratory: Diminished breath sounds on the left.   . Abdomen: Soft nontender nondistended with normal bowel sounds x4 quadrants. . Musculoskeletal: Trace lower extremity edema bilaterally.   . Skin: No ulcerative lesions noted or rashes, . Psychiatry: Mood is appropriate for condition and setting   Data Reviewed: CBC: Recent Labs  Lab 09/29/20 1635 09/30/20 0213  WBC 13.2* 10.8*  NEUTROABS 10.7*  --   HGB 14.1 13.2  HCT 44.2 41.6  MCV 78.6* 78.3*  PLT 306 258   Basic Metabolic Panel: Recent Labs  Lab 09/29/20 1635 09/30/20 0213  NA 132* 136  K 3.7 4.4  CL 100 100  CO2 22 26  GLUCOSE 113* 110*  BUN 13 12  CREATININE 1.14 1.35*  CALCIUM 9.0 9.0   GFR: Estimated Creatinine  Clearance: 68.6 mL/min (A) (by C-G formula based on SCr of 1.35 mg/dL (H)). Liver Function Tests: Recent Labs  Lab 09/29/20 1635  AST 33  ALT 29  ALKPHOS 90  BILITOT 0.6  PROT 6.3*  ALBUMIN 2.7*   No results for input(s): LIPASE, AMYLASE in the last 168 hours. No results for input(s): AMMONIA in the last 168 hours. Coagulation Profile: Recent Labs  Lab 09/29/20 1635  INR 1.2   Cardiac Enzymes: No results for input(s): CKTOTAL, CKMB, CKMBINDEX, TROPONINI in the last 168 hours. BNP (last 3 results) No results for input(s): PROBNP in the last 8760 hours. HbA1C: No results for input(s): HGBA1C in the last 72 hours. CBG: No results for input(s): GLUCAP in the last 168 hours. Lipid Profile: Recent Labs    09/30/20 0213  CHOL 141  HDL 37*  LDLCALC 85  TRIG 94  CHOLHDL 3.8   Thyroid Function Tests: No results for input(s): TSH, T4TOTAL, FREET4, T3FREE, THYROIDAB in the last 72 hours. Anemia Panel: No results for input(s): VITAMINB12, FOLATE, FERRITIN, TIBC, IRON, RETICCTPCT in the last 72 hours. Urine analysis:    Component Value Date/Time   COLORURINE YELLOW 09/29/2020 [place] 09/29/2020 1751  LABSPEC 1.005 09/29/2020 1751   PHURINE 6.0 09/29/2020 1751   GLUCOSEU NEGATIVE 09/29/2020 1751   HGBUR MODERATE (A) 09/29/2020 1751   BILIRUBINUR NEGATIVE 09/29/2020 1751   KETONESUR NEGATIVE 09/29/2020 1751   PROTEINUR NEGATIVE 09/29/2020 1751   NITRITE NEGATIVE 09/29/2020 1751   LEUKOCYTESUR NEGATIVE 09/29/2020 1751   Sepsis Labs: @LABRCNTIP (procalcitonin:4,lacticidven:4)  ) Recent Results (from the past 240 hour(s))  Blood Culture (routine x 2)     Status: None (Preliminary result)   Collection Time: 09/29/20  4:35 PM   Specimen: BLOOD LEFT HAND  Result Value Ref Range Status   Specimen Description BLOOD LEFT HAND  Final   Special Requests   Final    BOTTLES DRAWN AEROBIC AND ANAEROBIC Blood Culture adequate volume Performed at [facility], [address], [place], [place] [address]    Culture PENDING  Incomplete   Report Status PENDING  Incomplete  Blood Culture (routine x 2)     Status: None (Preliminary result)   Collection Time: 09/29/20  4:45 PM   Specimen: BLOOD RIGHT HAND  Result Value Ref Range Status   Specimen Description BLOOD RIGHT HAND  Final   Special Requests   Final    BOTTLES DRAWN AEROBIC AND ANAEROBIC Blood Culture results may not be optimal due to an inadequate volume of blood received in culture bottles Performed at [facility], [address] [place], [place] [address]    Culture PENDING  Incomplete   Report Status PENDING  Incomplete  Resp Panel by RT-PCR (Flu A&B, Covid) Nasopharyngeal Swab     Status: None   Collection Time: 09/29/20  5:07 PM   Specimen: Nasopharyngeal Swab; Nasopharyngeal(NP) swabs in vial transport medium  Result Value Ref Range Status   SARS Coronavirus 2 by RT PCR NEGATIVE NEGATIVE Final    Comment: (NOTE) SARS-CoV-2 target nucleic acids are NOT DETECTED.  The SARS-CoV-2 RNA is generally detectable in upper respiratory specimens during the acute phase of infection. The lowest concentration of SARS-CoV-2 viral copies this assay can detect is 138 copies/mL. A negative result does not preclude SARS-Cov-2 infection and should not be used as the sole basis for treatment or other patient management decisions. A negative result may occur with  improper specimen collection/handling, submission of specimen other than nasopharyngeal swab, presence of viral mutation(s) within the areas targeted by this assay, and inadequate number of viral copies(<138 copies/mL). A negative result must be combined with clinical observations, patient history, and epidemiological information. The expected result is Negative.  Fact Sheet for Patients:  EntrepreneurPulse.com.au  Fact Sheet for Healthcare Providers:  IncredibleEmployment.be  This test is no t  yet approved or cleared by the [place] FDA and  has been authorized for detection and/or diagnosis of SARS-CoV-2 by FDA under an Emergency Use Authorization (EUA). This EUA will remain  in effect (meaning this test can be used) for the duration of the COVID-19 declaration under Section 564(b)(1) of the Act, 21 U.S.C.section 360bbb-3(b)(1), unless the authorization is terminated  or revoked sooner.       Influenza A by PCR NEGATIVE NEGATIVE Final   Influenza B by PCR NEGATIVE NEGATIVE Final    Comment: (NOTE) The Xpert Xpress SARS-CoV-2/FLU/RSV plus assay is intended as an aid in the diagnosis of influenza from Nasopharyngeal swab specimens and should not be used as a sole basis for treatment. Nasal washings and aspirates are unacceptable for Xpert Xpress SARS-CoV-2/FLU/RSV testing.  Fact Sheet for Patients: EntrepreneurPulse.com.au  Fact Sheet for Healthcare Providers: IncredibleEmployment.be  This test is not yet approved or cleared by the [place] and has been authorized for detection and/or diagnosis of SARS-CoV-2 by FDA under an Emergency Use Authorization (EUA). This EUA will remain in effect (meaning this test can be used) for the duration of the COVID-19 declaration under Section 564(b)(1) of the Act, 21 U.S.C. section 360bbb-3(b)(1), unless the authorization is terminated or revoked.  Performed at [facility], [address], [place], [place] [address]       Studies: DG Chest 1 View  Result Date: 09/30/2020 CLINICAL DATA:  Post thoracentesis EXAM: CHEST  1 VIEW COMPARISON:  09/29/2020 FINDINGS: Slightly decreased, still large left pleural effusion. Some improvement in left lung aeration primarily at the apex. No pneumothorax. Stable nodule overlying the mid right lung. Unchanged mild right basilar interstitial prominence. Similar cardiomediastinal contours. IMPRESSION: Some improvement of left lung aeration after  thoracentesis. No pneumothorax. Persistent large left pleural effusion with associated atelectasis and possible underlying consolidation. Unchanged right basilar interstitial prominence and right mid lung nodule. Electronically Signed   By: Macy Mis M.D.   On: 09/30/2020 09:48   DG Chest 2 View  Result Date: 09/29/2020 CLINICAL DATA:  Shortness of breath EXAM: CHEST - 2 VIEW COMPARISON:  October 27, 2004 FINDINGS: There is essentially complete consolidation of the left hemithorax with probable combination of pleural effusion and extensive airspace consolidation throughout the left lung. There is a nodular opacity in the periphery of the right mid lung measuring 1.0 x 0.9 cm. There is interstitial prominence on the right which in part may be due to redistribution of blood flow to viable lung segments. Heart size appears grossly normal. Pulmonary vascularity on the right is normal. No adenopathy is evident in areas that can be assessed for potential adenopathy by radiography. Note that opacification on the left precludes assessment for potential adenopathy on the left. No bone lesions. IMPRESSION: Widespread airspace opacity on the left, likely due to combination of consolidation and pleural effusion. Underlying neoplasm on the left cannot be excluded. Given this circumstance, correlation with contrast enhanced chest CT may well be advisable at this time to further evaluate Nodular opacity periphery of right mid lung concerning for potential small neoplastic focus. Interstitial prominence on the right may in large part be due to redistribution of blood flow to viable lung segments. Heart size within normal limits. Electronically Signed   By: Lowella Grip III M.D.   On: 09/29/2020 16:13   CT Chest Wo Contrast  Result Date: 09/29/2020 CLINICAL DATA:  Chest pain and shortness of breath worsening for the past 2 months. Evaluation for malignancy, infection, mass, fluid. EXAM: CT CHEST WITHOUT CONTRAST  TECHNIQUE: Multidetector CT imaging of the chest was performed following the standard protocol without IV contrast. COMPARISON:  Chest x-ray 09/29/2020, chest x-ray 10/27/2004 FINDINGS: Cardiovascular: Normal heart size. Trace pericardial effusion. The aortic root appears to be normal in caliber. Suggestion of possible mild aortic valve leaflet calcifications. The ascending thoracic aorta is enlarged in caliber measuring up to 4.5 cm. The descending thoracic aorta is normal in caliber. Mild atherosclerotic plaque of the thoracic aorta. Possible mild left anterior descending coronary artery calcification. Mediastinum/Nodes: There is a 1.1 cm infra cardiac enlarged round lymph node (2:127). Enlarged mediastinal lymph nodes with as an example a right 1 cm paratracheal lymph node (2: 44) and a enlarged 1.1 cm subcarinal lymph node (2:65). Limited sensitivity for the detection of hilar adenopathy on this noncontrast study. No axillary lymphadenopathy bilaterally. Lungs/Pleura: At  least mild paraseptal and severe centrilobular emphysematous changes. Almost complete collapse of the left upper and lower lobes. There is a 1 cm solid round pulmonary nodule within the right middle lobe (4:83,99). Slightly more inferior, there there are a 0.6 cm right middle lobe pulmonary nodules (4:89). Several pulmonary micronodules at the right apex (4:17, 24, 31). Couple of round pulmonary nodule within the right lower lobe measuring 0.5 and 0.6 cm (4:1 16-117). Ground-glass subpleural nodule measuring 0.7 cm in the right middle lobe (4:96). Peribronchovascular nodular like 3.2 cm ground-glass airspace opacity within the right lower lobe (4:111). Diffuse mild bronchial wall thickening. Moderate to large volume simple fluid left pleural effusion. No right pleural effusion. No pneumothorax. Upper Abdomen: Suggestion of a partially visualized splenule. Otherwise no acute abnormality. Musculoskeletal: Indeterminate 1 cm subcutaneus soft tissue  density within the left anterolateral back at the level of scapula (2:55). Several other subcentimeter soft tissue densities within the left lower back (2:68, 99). Possible seroma findings that is collimated off view within the right anterolateral back (2:52). Sclerotic thoracic vertebra lesions within the T6, T9, T11, T12 vertebral bodies. Possible tiny sclerotic foci within the left aspect of the T7 vertebral body (6:103). Similarly within the left aspect of the T4 and T5 vertebral bodies. Sclerotic lesion within the right transverse process of the T3 vertebral body (4:10). No acute displaced fracture. Multilevel mild degenerative changes of the spine. IMPRESSION: 1. Constellation of findings consistent with metastatic malignancy. 2. Moderate to large volume left pleural effusion with almost complete collapse of the left upper and lower lobes. Associated mediastinal lymphadenopathy and limited evaluation for hilar lymphadenopathy on this noncontrast study. Underlying malignancy suspected (ddx includes metastasis or primary lung). Consider analysis of the pleural fluid for further evaluation. 3. Indeterminate multifocal right lung pulmonary nodules measuring up to 1 cm in the right middle lobe. 4. Indeterminate 3.2 cm peribronchovascular ground-glass airspace opacity within the right lower lobe. 5. Scattered suspicious sclerotic lesions concerning for osseous metastases. 6. Indeterminate pericentimeter subcutaneus soft tissue densities within the bilateral anterolateral back at the level of scapulas. 7. Severe emphysematous changes. 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 9. Aortic Atherosclerosis (ICD10-I70.0) and Emphysema (ICD10-J43.9). Electronically Signed   By: Iven Finn M.D.   On: 09/29/2020 17:19   IR THORACENTESIS ASP PLEURAL SPACE W/IMG GUIDE  Result Date: 09/30/2020 INDICATION: Shortness of breath. Large left pleural effusion. Request for diagnostic therapeutic thoracentesis. EXAM:  ULTRASOUND GUIDED LEFT THORACENTESIS MEDICATIONS: 1% plain lidocaine, 5 mL COMPLICATIONS: None immediate. PROCEDURE: An ultrasound guided thoracentesis was thoroughly discussed with the patient and questions answered. The benefits, risks, alternatives and complications were also discussed. The patient understands and wishes to proceed with the procedure. Written consent was obtained. Ultrasound was performed to localize and mark an adequate pocket of fluid in the left chest. The area was then prepped and draped in the normal sterile fashion. 1% Lidocaine was used for local anesthesia. Under ultrasound guidance a 6 Fr Safe-T-Centesis catheter was introduced. Thoracentesis was performed. The catheter was removed and a dressing applied. FINDINGS: A total of approximately 950 mL of dark, old bloody fluid was removed. Samples were sent to the laboratory as requested by the clinical team. IMPRESSION: Successful ultrasound guided left thoracentesis yielding 950 mL of pleural fluid. Read by: Ascencion Dike PA-C Electronically Signed   By: Jerilynn Mages.  Shick M.D.   On: 09/30/2020 10:13    Scheduled Meds: . aspirin  325 mg Oral Daily  . lidocaine  Continuous Infusions: . heparin 1,500 Units/hr (09/30/20 0800)     LOS: 1 day     Kayleen Memos, MD [facility] Pager [phone]  If 7PM-7AM, please contact night-coverage [email or web address] Password [email or web address] 09/30/2020, 12:04 PM

## 2020-09-30 NOTE — Progress Notes (Signed)
ANTICOAGULATION CONSULT NOTE - Follow Up Consult  Pharmacy Consult for Heparin Indication: elevated troponin and rule out PE  No Known Allergies  Patient Measurements: Height: 5\' 9"  (175.3 cm) Weight: 97.2 kg (214 lb 4.6 oz) IBW/kg (Calculated) : 70.7 Heparin Dosing Weight: 91 kg  Vital Signs: Temp: 98.4 F (36.9 C) (11/29 1103) Temp Source: Oral (11/29 1103) BP: 151/111 (11/29 1103) Pulse Rate: 121 (11/29 1103)  Labs: Recent Labs    09/29/20 1635 09/29/20 2032 09/30/20 0213 09/30/20 1000  HGB 14.1  --  13.2  --   HCT 44.2  --  41.6  --   PLT 306  --  278  --   APTT 28  --   --   --   LABPROT 14.3  --   --   --   INR 1.2  --   --   --   HEPARINUNFRC  --   --  <0.10* 0.16*  CREATININE 1.14  --  1.35*  --   TROPONINIHS 1,087* 1,380* 1,446*  --     Estimated Creatinine Clearance: 68.6 mL/min (A) (by C-G formula based on SCr of 1.35 mg/dL (H)).  Assessment:  59 yr old male admitted 11/28 pm with shortness of breath. Found to have large volume left pleural effusion, concern for metastatic malignancy. Refused CT to rule out PE due to reported hx contrast allergy even with premedication.   Started on IV 11/28 pm.      Initial heparin level undetectable (<0.10) > received 3000 units IV bolus and rate increased from 1100 to 1500 units/hr ~4:30am.  Level this morning remains subtherapeutic (0.16).  S/p thoracentesis this morning.  No interruptions of infusion noted.  No chest pain reported.     -  950 ml dark old blood removed with thoracentesis  Goal of Therapy:  Heparin level 0.3-0.7 units/ml Monitor platelets by anticoagulation protocol: Yes   Plan:   Increase heparin drip to 1750 units/hr.  No bolus with recent thoracentesis.  Heparin level ~6 hrs after rate change.  Daily heparin level and CBC while on heparin.   Discussed prior reaction to contrast. He reports flushing and warmth. No shortness of breath or skin reaction reported.    Arty Baumgartner,  [place] Phone: [phone] 09/30/2020,11:59 AM

## 2020-09-30 NOTE — Progress Notes (Signed)
Patient arrived back from IR vital signs obtained will monitor. Thoracentesis site with band aid.  Jahan Friedlander, Bettina Gavia RN

## 2020-09-30 NOTE — Progress Notes (Signed)
CRITICAL VALUE ALERT  Critical Value: troponin 1446  Date & Time Notied:  1694  09/30/20  Provider Notified: [place]  Orders Received/Actions taken:  No orders given

## 2020-09-30 NOTE — Progress Notes (Signed)
  Echocardiogram 2D Echocardiogram has been performed.  Michiel Cowboy 09/30/2020, 3:27 PM

## 2020-10-01 ENCOUNTER — Inpatient Hospital Stay (HOSPITAL_COMMUNITY): Payer: Self-pay

## 2020-10-01 DIAGNOSIS — J90 Pleural effusion, not elsewhere classified: Secondary | ICD-10-CM

## 2020-10-01 DIAGNOSIS — R609 Edema, unspecified: Secondary | ICD-10-CM

## 2020-10-01 LAB — CBC
HCT: 42.2 % (ref 39.0–52.0)
Hemoglobin: 13 g/dL (ref 13.0–17.0)
MCH: 24.5 pg — ABNORMAL LOW (ref 26.0–34.0)
MCHC: 30.8 g/dL (ref 30.0–36.0)
MCV: 79.5 fL — ABNORMAL LOW (ref 80.0–100.0)
Platelets: 266 10*3/uL (ref 150–400)
RBC: 5.31 MIL/uL (ref 4.22–5.81)
RDW: 17.2 % — ABNORMAL HIGH (ref 11.5–15.5)
WBC: 10.7 10*3/uL — ABNORMAL HIGH (ref 4.0–10.5)
nRBC: 0 % (ref 0.0–0.2)

## 2020-10-01 LAB — HEPARIN LEVEL (UNFRACTIONATED)
Heparin Unfractionated: 0.46 IU/mL (ref 0.30–0.70)
Heparin Unfractionated: 0.42 IU/mL (ref 0.30–0.70)

## 2020-10-01 LAB — BASIC METABOLIC PANEL
Anion gap: 6 (ref 5–15)
BUN: 13 mg/dL (ref 6–20)
CO2: 28 mmol/L (ref 22–32)
Calcium: 8.7 mg/dL — ABNORMAL LOW (ref 8.9–10.3)
Chloride: 99 mmol/L (ref 98–111)
Creatinine, Ser: 1.36 mg/dL — ABNORMAL HIGH (ref 0.61–1.24)
GFR, Estimated: >60 mL/min (ref >60)
Glucose, Bld: 94 mg/dL (ref 70–99)
Potassium: 4.3 mmol/L (ref 3.5–5.1)
Sodium: 133 mmol/L — ABNORMAL LOW (ref 135–145)

## 2020-10-01 LAB — PH, BODY FLUID: pH, Body Fluid: 7.2

## 2020-10-01 LAB — ACID FAST SMEAR (AFB, MYCOBACTERIA): Acid Fast Smear: NEGATIVE

## 2020-10-01 LAB — TROPONIN I (HIGH SENSITIVITY): Troponin I (High Sensitivity): 746 ng/L (ref <18)

## 2020-10-01 LAB — GLUCOSE, CAPILLARY
Glucose-Capillary: 87 mg/dL (ref 70–99)
Glucose-Capillary: 98 mg/dL (ref 70–99)

## 2020-10-01 MED ORDER — METOPROLOL TARTRATE 25 MG PO TABS
25.0000 mg | ORAL_TABLET | Freq: Two times a day (BID) | ORAL | Status: DC
  Administered 2020-10-01 – 2020-10-07 (×12): 25 mg via ORAL
  Filled 2020-10-01 (×12): qty 1

## 2020-10-01 MED ORDER — METOPROLOL TARTRATE 12.5 MG HALF TABLET
12.5000 mg | ORAL_TABLET | Freq: Once | ORAL | Status: AC
  Administered 2020-10-01: 12.5 mg via ORAL
  Filled 2020-10-01: qty 1

## 2020-10-01 MED ORDER — LOPERAMIDE HCL 2 MG PO CAPS: 2.0000 mg | ORAL_CAPSULE | ORAL | Status: DC | PRN

## 2020-10-01 NOTE — Consult Note (Signed)
NAME:  Brett Medina, MRN:  [id number], DOB:  September 12, 1961, LOS: 2 ADMISSION DATE:  09/29/2020, CONSULTATION DATE:  10/01/2020 REFERRING MD:  Dr. Nevada Crane , CHIEF COMPLAINT:  Pleural Effusion  Brief History   59 year old male with hx of tobacco abuse presenting with progressive shortness of breath with CT findings concerning for metastatic lung process with large left pleural effusion.    History of present illness    59 year old male with no significant past medical history other than tobacco abuse with a 68 year pack history (reports smoking for 45 years, 1.5 ppd) in which he quit smoking 2 months ago.  He does not see a physician on a regular basis.  Reports chronic bilateral lower leg swelling, as it runs in his family.    Presents with progressive two month history of progressive dyspnea.  Started out in September as dyspnea on exertion which progressed to dsypnea even at rest.  Reports chronic unchanged productive cough- with clear sputum.  Also reports 15lb weight loss over the last month.  He was seen at urgent care several weeks ago and prescribed prednisone, which did not help.  On admit, he was tachycardic, mildly tachypneic, and room air saturations of 91-97%.  Workup notable for WBC 13.2, troponin 1087->1380-> 1446-> 746, BNP 205, SARS2 negative, moderate Hgb on UA.  CXR showed near complete consolidation of left hemithorax with combination of pleural effusion and consolidation of left lung, with peripheral nodular opacity of right mid lung, all concerning for malignancy.  He was cultured and started on empiric antibiotics.  He was admitted to [facility] for further workup.   Cardiology consulted and are following given elevated troponin's and started on heparin gtt.  TTE showing LVEF 40-45% with global hypokinesis.  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  He underwent a left thoracentesis in IR on 11/29 with 950 ml fluid removed and sent for cytology and pleural studies.  Reports some improvement in SOB since thoracentesis.   Repeat CXR today, shows increased size in left pleural effusion.  Patient reports no worsening of SOB today.  Pulmonary consulted for further management and evaluation of pleural effusion and pulmonary nodules.   Past Medical History  Tobacco abuse   Significant Hospital Events   11/28 Admitted [place] 11/29 left thoracentesis/ cards consult  Consults:  IR Cardiology  Pulmonary  Procedures:  11/29 Left thoracentesis IR-> 950 ml of dark brown fluid  Significant Diagnostic Tests:  11/28 CT chest wo contrast >> 1. Constellation of findings consistent with metastatic malignancy. 2. Moderate to large volume left pleural effusion with almost complete collapse of the left upper and lower lobes. Associated mediastinal lymphadenopathy and limited evaluation for hilar lymphadenopathy on this noncontrast study. Underlying malignancy suspected (ddx includes metastasis or primary lung). Consider analysis of the pleural fluid for further evaluation. 3. Indeterminate multifocal right lung pulmonary nodules measuring up to 1 cm in the right middle lobe. 4. Indeterminate 3.2 cm peribronchovascular ground-glass airspace opacity within the right lower lobe. 5. Scattered suspicious sclerotic lesions concerning for osseous metastases. 6. Indeterminate pericentimeter subcutaneus soft tissue densities within the bilateral anterolateral back at the level of scapulas. 7. Severe emphysematous changes. 8. Ascending aorta aneurysm measuring up  to 4.5 cm. 9. Aortic Atherosclerosis (ICD10-I70.0) and Emphysema  TTE  11/29 >> 1. Left ventricular ejection fraction, by estimation, is 40 to 45%. The  left ventricle has mildly decreased function. The left ventricle  demonstrates global hypokinesis. Left ventricular diastolic parameters are  indeterminate.  2. Right ventricular systolic function is normal. The right ventricular  size is normal. Tricuspid regurgitation signal is inadequate for assessing  PA pressure.  3. The mitral valve is normal in structure. No evidence of mitral valve  regurgitation. No evidence of mitral stenosis.  4. The aortic valve was not well visualized. Aortic valve regurgitation  is trivial. No aortic stenosis is present.  5. Aortic dilatation noted. 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  6. The inferior vena cava is dilated in size with <50% respiratory  variability, suggesting right atrial pressure of 15 mmHg.   Micro Data:  11/28 SARS 2/ flu >>neg 11/28 BCx2 >> ngtd 11/28 UC >> 11/29 left pleural studies: GS >>          AFB >>                      Culture >>                                Cytology >>           Antimicrobials:  11/28 azithro >> 11/28 ceftriaxone >>  Interim history/subjective:   Objective   Blood pressure (!) 136/99, pulse (!) 109, temperature 97.9 F (36.6 C), temperature source Oral, resp. rate 20, height 5\' 9"  (1.753 m), weight 91.2 kg, SpO2 96 %.        Intake/Output Summary (Last 24 hours) at 10/01/2020 1603 Last data filed at 10/01/2020 0300 Gross per 24 hour  Intake 295.08 ml  Output 875 ml  Net -579.92 ml   Filed Weights   09/29/20 1522 09/29/20 2220 10/01/20 0441  Weight: 99.8 kg 97.2 kg 91.2 kg   Examination: General:  Pleasant older adult male sitting in bed in NAD HEENT: MM pink/moist Neuro: AOx4, MAE CV: rr, ST~111, no murmur PULM:  Non labored, clear on right, minimal breath sounds on left apex otherwise diminished, currently on 2L [place] GI: round, soft, bs active  Extremities: warm/dry,  trace LE edema  Skin: no rashes, irregular skin lesion to left mid upper thoracic spine  Resolved Hospital Problem list    Assessment & Plan:   Hypoxic respiratory failure  Large left pleural effusion with associated left lung compressive atelectasis mediastinal lymphadenopathy and right pulmonary nodules with suspicious sclerotic lesions  Emphysematous changes on CT - Lower extremity doppler negative.  RV reassuring on TTE, doubt acute PE; given contrast dye allergy, CTA not performed - continue supplemental O2 for sat goal > 88% - unable to determine pleural fluid by Lights criteria, but primarily exudative by pleural studies, LDH 4038, protein 3.4 - Cytology pending - Possibly going for repeat left thoracentesis today by IR.  Please send repeat cytology, as malignant cells may not be present on initial thoracentesis.  If unable to have procedure today, will need tomorrow, either by IR or by our team - Eventually, will likely need a bronchoscopy and biopsy of lung nodule  Systolic HF Elevated troponin Tachycardia - per Cardiology- ASA, statin, BB; pending further workup for ischemic evaluation given risk factors - ongoing heparin gtt for now  - TTE  w/ LVEF 40-45% with global hypokinesis   Remainder per primary team.  Pulmonary will follow along.   Best practice   Diet: heart healthy Pain/Anxiety/Delirium protocol (if indicated): n/a VAP protocol (if indicated): n/a DVT prophylaxis: heparin gtt GI prophylaxis: n/a Glucose control: n/a Mobility: as tolerated Code Status: full  Disposition: telemetry  Labs   CBC: Recent Labs  Lab 09/29/20 1635 09/30/20 0213 10/01/20 0154  WBC 13.2* 10.8* 10.7*  NEUTROABS 10.7*  --   --   HGB 14.1 13.2 13.0  HCT 44.2 41.6 42.2  MCV 78.6* 78.3* 79.5*  PLT 306 278 465    Basic Metabolic Panel: Recent Labs  Lab 09/29/20 1635 09/30/20 0213 10/01/20 0130  NA 132* 136 133*  K 3.7 4.4 4.3  CL 100 100 99  CO2 22 26 28   GLUCOSE  113* 110* 94  BUN 13 12 13   CREATININE 1.14 1.35* 1.36*  CALCIUM 9.0 9.0 8.7*   GFR: Estimated Creatinine Clearance: 66.1 mL/min (A) (by C-G formula based on SCr of 1.36 mg/dL (H)). Recent Labs  Lab 09/29/20 1635 09/30/20 0213 10/01/20 0154  WBC 13.2* 10.8* 10.7*  LATICACIDVEN 1.3  --   --     Liver Function Tests: Recent Labs  Lab 09/29/20 1635  AST 33  ALT 29  ALKPHOS 90  BILITOT 0.6  PROT 6.3*  ALBUMIN 2.7*   No results for input(s): LIPASE, AMYLASE in the last 168 hours. No results for input(s): AMMONIA in the last 168 hours.  ABG No results found for: PHART, PCO2ART, PO2ART, HCO3, TCO2, ACIDBASEDEF, O2SAT   Coagulation Profile: Recent Labs  Lab 09/29/20 1635  INR 1.2    Cardiac Enzymes: No results for input(s): CKTOTAL, CKMB, CKMBINDEX, TROPONINI in the last 168 hours.  HbA1C: Hgb A1c MFr Bld  Date/Time Value Ref Range Status  09/30/2020 02:13 AM 5.7 (H) 4.8 - 5.6 % Final    Comment:    (NOTE) Pre diabetes:          5.7%-6.4%  Diabetes:              >6.4%  Glycemic control for   <7.0% adults with diabetes     CBG: Recent Labs  Lab 10/01/20 0118 10/01/20 0638  GLUCAP 87 98    Review of Systems:   Review of Systems  Constitutional: Positive for weight loss. Negative for chills and fever.  Respiratory: Positive for cough, sputum production and shortness of breath. Negative for hemoptysis and wheezing.   Cardiovascular: Positive for orthopnea and leg swelling. Negative for chest pain, palpitations and claudication.  Gastrointestinal: Negative for abdominal pain, blood in stool, nausea and vomiting.  Neurological: Negative for dizziness, focal weakness, loss of consciousness and headaches.   Past Medical History  He,  has no past medical history on file.   Surgical History    Past Surgical History:  Procedure Laterality Date  . IR THORACENTESIS ASP PLEURAL SPACE W/IMG GUIDE  09/30/2020     Social History   reports that he quit  smoking about 2 months ago. His smoking use included cigarettes. He has never used smokeless tobacco. He reports current alcohol use. He reports previous drug use. Drug: Marijuana.   Family History   His family history is not on file.   Allergies Allergies  Allergen Reactions  . Contrast Media [Iodinated Diagnostic Agents]     Flushing, warmth, even with pre-medication     Home Medications  Prior to Admission medications   Medication Sig Start Date End  Date Taking? Authorizing Provider  albuterol (VENTOLIN HFA) 108 (90 Base) MCG/ACT inhaler Inhale 2 puffs into the lungs every 6 (six) hours as needed. 09/14/20 09/14/21 Yes [provider]  benzonatate (TESSALON) 100 MG capsule Take 1 capsule by mouth every 6 (six) hours as needed for cough. 09/21/20 09/21/21 Yes [provider]  doxycycline (VIBRA-TABS) 100 MG tablet Take 100 mg by mouth 2 (two) times daily. 09/21/20  Yes [provider]      Kennieth Rad, ACNP [facility] 10/01/2020, 6:09 PM  See Shea Evans for personal pager PCCM on call pager [phone]

## 2020-10-01 NOTE — Progress Notes (Addendum)
Progress Note  Patient Name: Brett Medina Date of Encounter: 10/01/2020  [facility] Cardiologist: Elouise Munroe, MD   Subjective   Breathing improving but not at baseline yet.   Inpatient Medications    Scheduled Meds: . aspirin EC  81 mg Oral Daily  . atorvastatin  20 mg Oral Daily  . metoprolol tartrate  12.5 mg Oral Once  . metoprolol tartrate  25 mg Oral BID   Continuous Infusions: . heparin 1,950 Units/hr (09/30/20 2132)   PRN Meds: acetaminophen **OR** acetaminophen, labetalol, lidocaine, ondansetron **OR** ondansetron (ZOFRAN) IV, polyethylene glycol   Vital Signs    Vitals:   10/01/20 0020 10/01/20 0441 10/01/20 0838 10/01/20 0902  BP: (!) 152/114 (!) 139/113 (!) 137/105   Pulse: (!) 105 (!) 109 (!) 118 (!) 104  Resp: 18 17 16    Temp: 99.1 F (37.3 C) 99.3 F (37.4 C) 98 F (36.7 C)   TempSrc: Oral Oral Oral   SpO2: 99% 98% 96%   Weight:  91.2 kg    Height:        Intake/Output Summary (Last 24 hours) at 10/01/2020 0944 Last data filed at 10/01/2020 0300 Gross per 24 hour  Intake 406.54 ml  Output 875 ml  Net -468.46 ml   Last 3 Weights 10/01/2020 09/29/2020 09/29/2020  Weight (lbs) 201 lb 1 oz 214 lb 4.6 oz 220 lb  Weight (kg) 91.2 kg 97.2 kg 99.791 kg      Telemetry    Sinus tachycardia at 100s - Personally Reviewed  ECG  No new tracing   Physical Exam   GEN: No acute distress.   Neck: No JVD Cardiac: regular tachycardic, no murmurs, rubs, or gallops.  Respiratory: diminished breath sound on left GI: Soft, nontender, non-distended  MS: No edema; No deformity. Neuro:  Nonfocal  Psych: Normal affect   Labs    High Sensitivity Troponin:   Recent Labs  Lab 09/29/20 1635 09/29/20 2032 09/30/20 0213  TROPONINIHS 1,087* 1,380* 1,446*      Chemistry Recent Labs  Lab 09/29/20 1635 09/30/20 0213 10/01/20 0130  NA 132* 136 133*  K 3.7 4.4 4.3  CL 100 100 99  CO2 22 26 28   GLUCOSE 113* 110* 94  BUN 13 12 13    CREATININE 1.14 1.35* 1.36*  CALCIUM 9.0 9.0 8.7*  PROT 6.3*  --   --   ALBUMIN 2.7*  --   --   AST 33  --   --   ALT 29  --   --   ALKPHOS 90  --   --   BILITOT 0.6  --   --   GFRNONAA >60 >60 >60  ANIONGAP 10 10 6      Hematology Recent Labs  Lab 09/29/20 1635 09/30/20 0213 10/01/20 0154  WBC 13.2* 10.8* 10.7*  RBC 5.62 5.31 5.31  HGB 14.1 13.2 13.0  HCT 44.2 41.6 42.2  MCV 78.6* 78.3* 79.5*  MCH 25.1* 24.9* 24.5*  MCHC 31.9 31.7 30.8  RDW 17.8* 17.1* 17.2*  PLT 306 278 266    BNP Recent Labs  Lab 09/29/20 1635  BNP 205.0*     Radiology    DG Chest 1 View  Result Date: 09/30/2020 CLINICAL DATA:  Post thoracentesis EXAM: CHEST  1 VIEW COMPARISON:  09/29/2020 FINDINGS: Slightly decreased, still large left pleural effusion. Some improvement in left lung aeration primarily at the apex. No pneumothorax. Stable nodule overlying the mid right lung. Unchanged mild right basilar interstitial prominence. Similar  cardiomediastinal contours. IMPRESSION: Some improvement of left lung aeration after thoracentesis. No pneumothorax. Persistent large left pleural effusion with associated atelectasis and possible underlying consolidation. Unchanged right basilar interstitial prominence and right mid lung nodule. Electronically Signed   By: Macy Mis M.D.   On: 09/30/2020 09:48   DG Chest 2 View  Result Date: 09/29/2020 CLINICAL DATA:  Shortness of breath EXAM: CHEST - 2 VIEW COMPARISON:  October 27, 2004 FINDINGS: There is essentially complete consolidation of the left hemithorax with probable combination of pleural effusion and extensive airspace consolidation throughout the left lung. There is a nodular opacity in the periphery of the right mid lung measuring 1.0 x 0.9 cm. There is interstitial prominence on the right which in part may be due to redistribution of blood flow to viable lung segments. Heart size appears grossly normal. Pulmonary vascularity on the right is normal.  No adenopathy is evident in areas that can be assessed for potential adenopathy by radiography. Note that opacification on the left precludes assessment for potential adenopathy on the left. No bone lesions. IMPRESSION: Widespread airspace opacity on the left, likely due to combination of consolidation and pleural effusion. Underlying neoplasm on the left cannot be excluded. Given this circumstance, correlation with contrast enhanced chest CT may well be advisable at this time to further evaluate Nodular opacity periphery of right mid lung concerning for potential small neoplastic focus. Interstitial prominence on the right may in large part be due to redistribution of blood flow to viable lung segments. Heart size within normal limits. Electronically Signed   By: Lowella Grip III M.D.   On: 09/29/2020 16:13   CT Chest Wo Contrast  Result Date: 09/29/2020 CLINICAL DATA:  Chest pain and shortness of breath worsening for the past 2 months. Evaluation for malignancy, infection, mass, fluid. EXAM: CT CHEST WITHOUT CONTRAST TECHNIQUE: Multidetector CT imaging of the chest was performed following the standard protocol without IV contrast. COMPARISON:  Chest x-ray 09/29/2020, chest x-ray 10/27/2004 FINDINGS: Cardiovascular: Normal heart size. Trace pericardial effusion. The aortic root appears to be normal in caliber. Suggestion of possible mild aortic valve leaflet calcifications. The ascending thoracic aorta is enlarged in caliber measuring up to 4.5 cm. The descending thoracic aorta is normal in caliber. Mild atherosclerotic plaque of the thoracic aorta. Possible mild left anterior descending coronary artery calcification. Mediastinum/Nodes: There is a 1.1 cm infra cardiac enlarged round lymph node (2:127). Enlarged mediastinal lymph nodes with as an example a right 1 cm paratracheal lymph node (2: 44) and a enlarged 1.1 cm subcarinal lymph node (2:65). Limited sensitivity for the detection of hilar adenopathy  on this noncontrast study. No axillary lymphadenopathy bilaterally. Lungs/Pleura: At least mild paraseptal and severe centrilobular emphysematous changes. Almost complete collapse of the left upper and lower lobes. There is a 1 cm solid round pulmonary nodule within the right middle lobe (4:83,99). Slightly more inferior, there there are a 0.6 cm right middle lobe pulmonary nodules (4:89). Several pulmonary micronodules at the right apex (4:17, 24, 31). Couple of round pulmonary nodule within the right lower lobe measuring 0.5 and 0.6 cm (4:1 16-117). Ground-glass subpleural nodule measuring 0.7 cm in the right middle lobe (4:96). Peribronchovascular nodular like 3.2 cm ground-glass airspace opacity within the right lower lobe (4:111). Diffuse mild bronchial wall thickening. Moderate to large volume simple fluid left pleural effusion. No right pleural effusion. No pneumothorax. Upper Abdomen: Suggestion of a partially visualized splenule. Otherwise no acute abnormality. Musculoskeletal: Indeterminate 1 cm subcutaneus soft tissue density  within the left anterolateral back at the level of scapula (2:55). Several other subcentimeter soft tissue densities within the left lower back (2:68, 99). Possible seroma findings that is collimated off view within the right anterolateral back (2:52). Sclerotic thoracic vertebra lesions within the T6, T9, T11, T12 vertebral bodies. Possible tiny sclerotic foci within the left aspect of the T7 vertebral body (6:103). Similarly within the left aspect of the T4 and T5 vertebral bodies. Sclerotic lesion within the right transverse process of the T3 vertebral body (4:10). No acute displaced fracture. Multilevel mild degenerative changes of the spine. IMPRESSION: 1. Constellation of findings consistent with metastatic malignancy. 2. Moderate to large volume left pleural effusion with almost complete collapse of the left upper and lower lobes. Associated mediastinal lymphadenopathy and  limited evaluation for hilar lymphadenopathy on this noncontrast study. Underlying malignancy suspected (ddx includes metastasis or primary lung). Consider analysis of the pleural fluid for further evaluation. 3. Indeterminate multifocal right lung pulmonary nodules measuring up to 1 cm in the right middle lobe. 4. Indeterminate 3.2 cm peribronchovascular ground-glass airspace opacity within the right lower lobe. 5. Scattered suspicious sclerotic lesions concerning for osseous metastases. 6. Indeterminate pericentimeter subcutaneus soft tissue densities within the bilateral anterolateral back at the level of scapulas. 7. Severe emphysematous changes. 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 9. Aortic Atherosclerosis (ICD10-I70.0) and Emphysema (ICD10-J43.9). Electronically Signed   By: Iven Finn M.D.   On: 09/29/2020 17:19   ECHOCARDIOGRAM COMPLETE  Result Date: 09/30/2020    ECHOCARDIOGRAM REPORT   Patient Name:   Brett Medina Date of Exam: 09/30/2020 Medical Rec #:  [id number]     Height:       69.0 in Accession #:    [id number]    Weight:       214.3 lb Date of Birth:  [birth year]      BSA:          2.127 m Patient Age:    59 years      BP:           151/111 mmHg Patient Gender: M             HR:           111 bpm. Exam Location:  Inpatient Procedure: 2D Echo, Cardiac Doppler and Color Doppler Indications:    Elevated Troponin  History:        Patient has no prior history of Echocardiogram examinations.                 Risk Factors:Former Smoker.  Sonographer:    Vickie Epley RDCS Referring Phys: [id number] [place]  1. Left ventricular ejection fraction, by estimation, is 40 to 45%. The left ventricle has mildly decreased function. The left ventricle demonstrates global hypokinesis. Left ventricular diastolic parameters are indeterminate.  2. Right ventricular systolic function is normal. The right ventricular size is normal. Tricuspid regurgitation signal is inadequate for  assessing PA pressure.  3. The mitral valve is normal in structure. No evidence of mitral valve regurgitation. No evidence of mitral stenosis.  4. The aortic valve was not well visualized. Aortic valve regurgitation is trivial. No aortic stenosis is present.  5. Aortic dilatation noted. There is mild dilatation of the ascending aorta, measuring 37 mm.  6. The inferior vena cava is dilated in size with <50% respiratory variability, suggesting right atrial pressure of 15 mmHg. FINDINGS  Left Ventricle: Left ventricular ejection fraction, by estimation, is 40 to  45%. The left ventricle has mildly decreased function. The left ventricle demonstrates global hypokinesis. Definity contrast agent was given IV to delineate the left ventricular  endocardial borders. The left ventricular internal cavity size was normal in size. There is no left ventricular hypertrophy. Left ventricular diastolic parameters are indeterminate. Right Ventricle: The right ventricular size is normal. No increase in right ventricular wall thickness. Right ventricular systolic function is normal. Tricuspid regurgitation signal is inadequate for assessing PA pressure. Left Atrium: Left atrial size was normal in size. Right Atrium: Right atrial size was not well visualized. Pericardium: There is no evidence of pericardial effusion. Mitral Valve: The mitral valve is normal in structure. No evidence of mitral valve regurgitation. No evidence of mitral valve stenosis. Tricuspid Valve: The tricuspid valve is normal in structure. Tricuspid valve regurgitation is trivial. Aortic Valve: The aortic valve was not well visualized. Aortic valve regurgitation is trivial. No aortic stenosis is present. Pulmonic Valve: The pulmonic valve was not well visualized. Pulmonic valve regurgitation is not visualized. Aorta: The aortic root is normal in size and structure and aortic dilatation noted. There is mild dilatation of the ascending aorta, measuring 37 mm. Venous:  The inferior vena cava is dilated in size with less than 50% respiratory variability, suggesting right atrial pressure of 15 mmHg. IAS/Shunts: The interatrial septum was not well visualized.  LEFT VENTRICLE PLAX 2D LVIDd:         5.10 cm      Diastology LVIDs:         4.40 cm      LV e' medial:    4.46 cm/s LV PW:         0.90 cm      LV E/e' medial:  14.5 LV IVS:        0.90 cm      LV e' lateral:   7.07 cm/s LVOT diam:     2.10 cm      LV E/e' lateral: 9.1 LV SV:         50 LV SV Index:   23 LVOT Area:     3.46 cm  LV Volumes (MOD) LV vol d, MOD A2C: 102.0 ml LV vol d, MOD A4C: 131.0 ml LV vol s, MOD A2C: 68.2 ml LV vol s, MOD A4C: 81.5 ml LV SV MOD A2C:     33.8 ml LV SV MOD A4C:     131.0 ml LV SV MOD BP:      41.2 ml RIGHT VENTRICLE RV S prime:     19.40 cm/s TAPSE (M-mode): 2.2 cm LEFT ATRIUM             Index       RIGHT ATRIUM           Index LA diam:        2.30 cm 1.08 cm/m  RA Area:     11.50 cm LA Vol (A2C):   36.2 ml 17.02 ml/m RA Volume:   28.90 ml  13.58 ml/m LA Vol (A4C):   16.7 ml 7.85 ml/m LA Biplane Vol: 27.1 ml 12.74 ml/m  AORTIC VALVE LVOT Vmax:   99.20 cm/s LVOT Vmean:  73.000 cm/s LVOT VTI:    0.143 m  AORTA Ao Root diam: 3.90 cm Ao Asc diam:  3.70 cm Ao Desc diam: 2.60 cm MITRAL VALVE MV Area (PHT): 9.85 cm     SHUNTS MV Decel Time: 77 msec      Systemic VTI:  0.14 m MV E  velocity: 64.50 cm/s   Systemic Diam: 2.10 cm MV A velocity: 101.00 cm/s MV E/A ratio:  0.64 Oswaldo Milian MD Electronically signed by Oswaldo Milian MD Signature Date/Time: 09/30/2020/9:27:23 PM    Final    IR THORACENTESIS ASP PLEURAL SPACE W/IMG GUIDE  Result Date: 09/30/2020 INDICATION: Shortness of breath. Large left pleural effusion. Request for diagnostic therapeutic thoracentesis. EXAM: ULTRASOUND GUIDED LEFT THORACENTESIS MEDICATIONS: 1% plain lidocaine, 5 mL COMPLICATIONS: None immediate. PROCEDURE: An ultrasound guided thoracentesis was thoroughly discussed with the patient and questions  answered. The benefits, risks, alternatives and complications were also discussed. The patient understands and wishes to proceed with the procedure. Written consent was obtained. Ultrasound was performed to localize and mark an adequate pocket of fluid in the left chest. The area was then prepped and draped in the normal sterile fashion. 1% Lidocaine was used for local anesthesia. Under ultrasound guidance a 6 Fr Safe-T-Centesis catheter was introduced. Thoracentesis was performed. The catheter was removed and a dressing applied. FINDINGS: A total of approximately 950 mL of dark, old bloody fluid was removed. Samples were sent to the laboratory as requested by the clinical team. IMPRESSION: Successful ultrasound guided left thoracentesis yielding 950 mL of pleural fluid. Read by: Ascencion Dike PA-C Electronically Signed   By: Jerilynn Mages.  Shick M.D.   On: 09/30/2020 10:13    Cardiac Studies   Echo 09/30/2020 1. Left ventricular ejection fraction, by estimation, is 40 to 45%. The  left ventricle has mildly decreased function. The left ventricle  demonstrates global hypokinesis. Left ventricular diastolic parameters are  indeterminate.  2. Right ventricular systolic function is normal. The right ventricular  size is normal. Tricuspid regurgitation signal is inadequate for assessing  PA pressure.  3. The mitral valve is normal in structure. No evidence of mitral valve  regurgitation. No evidence of mitral stenosis.  4. The aortic valve was not well visualized. Aortic valve regurgitation  is trivial. No aortic stenosis is present.  5. Aortic dilatation noted. 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  6. The inferior vena cava is dilated in size with <50% respiratory  variability, suggesting right atrial pressure of 15 mmHg.   Patient Profile     59 y.o. male with no significant past medical history except for tobacco smoking and no regular primary care follow-up who  presented to emergency room  with 30-months history of progressive worsening dyspnea on exertion, orthopnea, PND and lower extremity edema. [place] to have acute hypoxic respiratory failure and large L pleural effusion. Cardiology is asked for elevated troponin.   Assessment & Plan    1. Elevated troponin - Suspeced demand ischemia 2nd to respiratory issue - No chest pain or pressure with and without exertion  - EKG without ischemic changes -Hs-troponin 1087>>[id number]>>1446>> recheck this AM to see the trend  - He does have cardiac risk factors for underlying CAD and coronary calcification on non contrast CT - Echo showed reduced LVEF at 40-45% and global hypokinesis - Continue ASA, statin, BB (increase dose today) and Iv heparin  - Discussed non-invasive (stress test vs coronary CT) vs invasive (cath) evaluation and due usage with the patient in details. Final Plan after discussion with MD. ? Tachycardia induced.   2. Acute hypoxic respiratory failure 3. Large left pleural effusion - S/p left thoracentesis yielding 950 cc of fluid removal by IR on 11/29 -Chest x-ray today yesterday  persistent large pleural effusion a -Non contrast CT of chest (patient reported dye allergy  despite premedication) concerning for malignancy  -Pending cytology  4.  Sinus tachycardia -Patient denies chest pain  -His blood pressure has been stable -Given concern for pulmonary embolism and elevated troponin >>>Continue on IV heparin  -Thankfully normal RV function and size  - HR improving, increase metoprolol to 25mg  BID.   5.  Bilateral lower extremity edema -BNP minimally elevated at 250 -Pending lower extremity doppler to rule out DVT  6. Chronic systolic CHF - Net I & O negative 23cc  - Echo showed reduced LVEF at 40-45% and global hypokinesis - Consolidate metoprolol to long acting and add ACE/ARB prior to discharge  7. Hx of at least 45 pack yr tobacco smoking - Says quit 2 months ago when his  symptoms started   For questions or updates, please contact [place] Please consult [email or web address] for contact info under        SignedLeanor Kail, PA  10/01/2020, 9:44 AM    Patient seen and examined with Vin Bhagat.  Agree as above, with the following exceptions and changes as noted below.  The patient was out of his room this morning so I came back in the afternoon and discussed his care with him and with his wife who is at the bedside.  We discussed results of the echocardiogram which show mildly reduced ejection fraction of 40 to 45% without clear regional wall motion abnormalities.  He had a troponin elevation with a peak of 1400.  This may all be from demand ischemia and his low EF may be tachycardia mediated.  However he has risk factors including significant smoking history and probable coronary artery calcifications on the noncontrast CT performed at his admission.  At this point an ischemic evaluation is warranted.  Gen: NAD, CV: Regular rhythm and tachycardic, no murmurs, Lungs: Decreased breath sounds on the left, Abd: soft, Extrem: Warm, well perfused, mild diffuse bilateral edema, Neuro/Psych: alert and oriented x 3, normal mood and affect. All available labs, radiology testing, previous records reviewed.   I have reviewed the options for an ischemic evaluation with the patient and his wife.  We reviewed that while in hospital we could perform a coronary angiogram, CT coronary angiogram, nuclear stress test.  A CT coronary angiogram will likely be nondiagnostic in the setting of sinus tachycardia.  I reviewed the options of invasive coronary angiogram and nuclear stress test with the patient and his wife and they agree that coronary angiography would be a more definitive test.  We reviewed his reported contrast allergy.  He had flushing with contrast many years ago.  We discussed that contrast agents have improved over the years, and we will provide premedication prior to  contrast administration.  I believe a nuclear stress test would be a suboptimal study given potential for artifact and in the setting of elevated troponin and reduced EF, may not inform us any further.  Barriers to coronary angiography are patient cannot yet lie flat.  Primary service is planning repeat thoracentesis for symptom management.  We are still awaiting cytology on his 1st thoracentesis fluid.  2nd barrier is procedural planning.  If he requires PCI, he would be committed to DAPT.  This may be premature prior to having a definitive plan for probable lung malignancy.  We will reassess daily and tentatively plan for later this week or early next week for ischemic evaluation.  I have consented the patient for coronary angiography as noted below whenever it is able to be scheduled.  Consider adding  losartan today for heart failure therapy and blood pressure management.  INFORMED CONSENT: I have reviewed the risks, indications, and alternatives to cardiac catheterization, possible angioplasty, and stenting with the patient. Risks include but are not limited to bleeding, infection, vascular injury, stroke, myocardial infection, arrhythmia, kidney injury, radiation-related injury in the case of prolonged fluoroscopy use, emergency cardiac surgery, and death. The patient understands the risks of serious complication is 1-2 in 9290 with diagnostic cardiac cath and 1-2% or less with angioplasty/stenting.    Elouise Munroe, MD 10/01/20 4:56 PM

## 2020-10-01 NOTE — Progress Notes (Signed)
ANTICOAGULATION CONSULT NOTE - Follow Up Consult  Pharmacy Consult for Heparin Indication: elevated troponin and rule out PE  Allergies  Allergen Reactions  . Contrast Media [Iodinated Diagnostic Agents]     Flushing, warmth, even with pre-medication    Patient Measurements: Height: 5\' 9"  (175.3 cm) Weight: 91.2 kg (201 lb 1 oz) IBW/kg (Calculated) : 70.7 Heparin Dosing Weight: 91 kg  Vital Signs: Temp: 98 F (36.7 C) (11/30 0838) Temp Source: Oral (11/30 0838) BP: 137/105 (11/30 0838) Pulse Rate: 104 (11/30 0902)  Labs: Recent Labs    09/29/20 1635 09/29/20 1635 09/29/20 2032 09/30/20 0213 09/30/20 1000 09/30/20 1736 10/01/20 0130 10/01/20 0154 10/01/20 0907 10/01/20 0917  HGB 14.1   < >  --  13.2  --   --   --  13.0  --   --   HCT 44.2  --   --  41.6  --   --   --  42.2  --   --   PLT 306  --   --  278  --   --   --  266  --   --   APTT 28  --   --   --   --   --   --   --   --   --   LABPROT 14.3  --   --   --   --   --   --   --   --   --   INR 1.2  --   --   --   --   --   --   --   --   --   HEPARINUNFRC  --   --   --  <0.10*   < > 0.21* 0.46  --  0.42  --   CREATININE 1.14  --   --  1.35*  --   --  1.36*  --   --   --   TROPONINIHS 1,087*   < > 1,380* 1,446*  --   --   --   --   --  746*   < > = values in this interval not displayed.    Estimated Creatinine Clearance: 66.1 mL/min (A) (by C-G formula based on SCr of 1.36 mg/dL (H)).  Assessment:  59 yr old male admitted 11/28 pm with shortness of breath. Found to have large volume left pleural effusion, concern for metastatic malignancy. Refused CT to rule out PE due to reported hx contrast allergy even with premedication >flushing and warmth reported. No shortness of breath of skin reaction.  IV heparin begun 11/28 pm   S/p thoracentesis 11/29 (950 ml dark old blood removed).   Heparin level remains therapeutic (0.42) on 1950 units/hr. CBC stable.  Duplex negative for DVT.    Goal of Therapy:   Heparin level 0.3-0.7 units/ml Monitor platelets by anticoagulation protocol: Yes   Plan:   Continue heparin drip at 1950 units/hr.  Daily heparin level and CBC while on heparin.  Cardiology decision on non-invasive vs invasive evaluation pending.  Will follow up plans, studies.  Arty Baumgartner, [place] Phone: [phone] 10/01/2020,11:10 AM

## 2020-10-01 NOTE — Plan of Care (Signed)
  Problem: Health Behavior/Discharge Planning: Goal: Ability to manage health-related needs will improve Outcome: Not Progressing   

## 2020-10-01 NOTE — Progress Notes (Signed)
PROGRESS NOTE  Brett Medina UXN:[id number] DOB: 11/25/1960 DOA: 09/29/2020 PCP: Patient, No Pcp Per  HPI/Recap of past 24 hours: DREZDEN SEITZINGER is a 59 y.o. male with medical history significant for tobacco abuse.  Presented to the ED with complaints of difficulty breathing ongoing over the past 2 months at least.  Reports he was seen at urgent care several x40 was given prednisone.  Patient quit smoking cigarettes about 2 months ago. He denies chest pain.  Has chronic unchanged bilateral lower extremity swelling.  Reports a productive cough over the past few weeks. No family history of heart attacks.  ED Course: O2 sats 91 to 97% on room air.  Tachycardic to 135, mild tachypnea to 22.  Lactic acid 1.3.  WBC 13.2.  Troponin 1087.  UA with moderate hemoglobin.  Chest CT-Constellation of findings consistent with metastatic malignancy, moderate to large volume left pleural effusion with almost complete collapse of left upper and lower lobes (see detailed report).  Patient reports contrast allergy, he refused CT with contrast to rule out PE as he was tachycardic.  Ceftriaxone and doxycycline given for possible pneumonia, then discontinued.  Heparin drip started.  Hospitalist admit for large left pleural effusion.  Cardiology was consulted for elevated troponin, following.  Post left thoracentesis on 09/30/20 by IR 950 cc of dark bloody fluid removed.    2D echo 11/29 showing reduced LVEF 40-45% with global left ventricle hypokinesis.  B/L LE duplex US negative for DVT.  10/01/20: Seen and examined with his wife at bedside.  Noted persistent non productive cough on exam.  CXR independently reviewed showing recurrent large left pleural effusion.  IR consult for repeat left thoracentesis.  Pulmonary consulted to assist with the management.     Assessment/Plan: Principal Problem:   Large pleural effusion Active Problems:   Elevated troponin  Acute hypoxic respiratory failure secondary to large  left pleural effusion, unclear etiology, post left thoracentesis, 950 cc of fluid removed by IR on 09/30/2020 Not on oxygen supplementation at baseline Fluid sent for analysis, follow results and cytlogy Currently requiring 3 L to maintain O2 saturation greater than 92%. Personally reviewed chest x-ray done post left thoracentesis which showed residual moderate to large left pleural effusions, possible superimposed atelectasis.  Will likely need to repeat thoracentesis. Repeated CXR 11/30 independently reviewed showing recurrent large left pleural effusion.  IR consult for repeat left thoracentesis.  Pulmonary consulted to assist with the management.   Elevated troponin, suspect demand ischemia in the setting of acute hypoxic respiratory failure Patient denies any chest pain or any anginal symptoms. No evidence of acute ischemia on twelve-lead EKG Troponin peaked at 1400, downtrending Received a dose of aspirin 325 mg once Currently on heparin drip-patient had declined CTA chest to rule out PE due to history of allergy to IV contrast even with pretreatment. LDL 85, started low-dose statin Cardiology following  Acute systolic CHF Presented with elevated BNP, chronic B/L LE edema LVEF 40-45% on 2D echo 11/29 Start strict I&O and daily weight Currently on asa 81, lipitor 20 and lopressor 25 bid Management per cards  Persistent sinus tachycardia TSH nornal -Continue IV heparin for now, his large pleural effusion likely driving his tachycardia, but if persistent tachycardia may need to rule out PE. (Currently refusing CT with contrast due to allergy reaction, even with premedication). B/L LE duplex US negative for DVT  Large left pleural effusion, unclear etiology. Follow cytology and fluid analysis  Leukocytosis, downtrending, likely reactive in the setting  of acute hypoxic respiratory failure with large left pleural effusion. Presented with WBC 13.2K, downtrending to 10.8K Currently not  on antibiotics Continue to monitor fever curve and WBC  Chronic bilateral lower extremity edema Elevate lower extremities    Code Status: Full code  Family Communication: His wife at bedside, updated on 10/01/20  Disposition Plan: Likely will DC to home when cardiology and pulmonary sign off.   Consultants:  Cardiology  Pulmonary  Procedures:  Left thoracentesis by IR on 09/30/2020  Antimicrobials:  None  DVT prophylaxis: Heparin drip  Status is: Inpatient    Dispo:  Patient From: Home  Planned Disposition: Home  Expected discharge date: 10/03/20  Medically stable for discharge: No, ongoing management of acute hypoxic respiratory failure, elevated troponin with heparin drip, recurrent large left pleural effusion.         Objective: Vitals:   10/01/20 0838 10/01/20 0839 10/01/20 0902 10/01/20 1256  BP: (!) 137/105 (!) 137/106  (!) 136/99  Pulse: (!) 118 (!) 104 (!) 104 (!) 109  Resp: 16 18  20   Temp: 98 F (36.7 C) 98 F (36.7 C)  97.9 F (36.6 C)  TempSrc: Oral   Oral  SpO2: 96%   96%  Weight:      Height:        Intake/Output Summary (Last 24 hours) at 10/01/2020 1534 Last data filed at 10/01/2020 0300 Gross per 24 hour  Intake 295.08 ml  Output 875 ml  Net -579.92 ml   Filed Weights   09/29/20 1522 09/29/20 2220 10/01/20 0441  Weight: 99.8 kg 97.2 kg 91.2 kg    Exam:  . General: 59 y.o. year-old male Pleasant well developped well nourished in NAD A&O x 3 . Cardiovascular:RRR no rubs or gallops . Respiratory: Diminished breath sounds on the left.  Persistent dry cough on exam. . Abdomen: Soft NT NBS  . Musculoskeletal: Trace LE edema bilaterally   . Psychiatry: Mood is appropriate for condition and setting.   Data Reviewed: CBC: Recent Labs  Lab 09/29/20 1635 09/30/20 0213 10/01/20 0154  WBC 13.2* 10.8* 10.7*  NEUTROABS 10.7*  --   --   HGB 14.1 13.2 13.0  HCT 44.2 41.6 42.2  MCV 78.6* 78.3* 79.5*  PLT 306 278 355    Basic Metabolic Panel: Recent Labs  Lab 09/29/20 1635 09/30/20 0213 10/01/20 0130  NA 132* 136 133*  K 3.7 4.4 4.3  CL 100 100 99  CO2 22 26 28   GLUCOSE 113* 110* 94  BUN 13 12 13   CREATININE 1.14 1.35* 1.36*  CALCIUM 9.0 9.0 8.7*   GFR: Estimated Creatinine Clearance: 66.1 mL/min (A) (by C-G formula based on SCr of 1.36 mg/dL (H)). Liver Function Tests: Recent Labs  Lab 09/29/20 1635  AST 33  ALT 29  ALKPHOS 90  BILITOT 0.6  PROT 6.3*  ALBUMIN 2.7*   No results for input(s): LIPASE, AMYLASE in the last 168 hours. No results for input(s): AMMONIA in the last 168 hours. Coagulation Profile: Recent Labs  Lab 09/29/20 1635  INR 1.2   Cardiac Enzymes: No results for input(s): CKTOTAL, CKMB, CKMBINDEX, TROPONINI in the last 168 hours. BNP (last 3 results) No results for input(s): PROBNP in the last 8760 hours. HbA1C: Recent Labs    09/30/20 0213  HGBA1C 5.7*   CBG: Recent Labs  Lab 10/01/20 0118 10/01/20 0638  GLUCAP 87 98   Lipid Profile: Recent Labs    09/30/20 0213  CHOL 141  HDL 37*  LDLCALC  85  TRIG 94  CHOLHDL 3.8   Thyroid Function Tests: Recent Labs    09/30/20 0213  TSH 1.901   Anemia Panel: No results for input(s): VITAMINB12, FOLATE, FERRITIN, TIBC, IRON, RETICCTPCT in the last 72 hours. Urine analysis:    Component Value Date/Time   COLORURINE YELLOW 09/29/2020 [place] 09/29/2020 1751   LABSPEC 1.005 09/29/2020 1751   PHURINE 6.0 09/29/2020 1751   GLUCOSEU NEGATIVE 09/29/2020 1751   HGBUR MODERATE (A) 09/29/2020 1751   BILIRUBINUR NEGATIVE 09/29/2020 1751   KETONESUR NEGATIVE 09/29/2020 1751   PROTEINUR NEGATIVE 09/29/2020 1751   NITRITE NEGATIVE 09/29/2020 1751   LEUKOCYTESUR NEGATIVE 09/29/2020 1751   Sepsis Labs: @LABRCNTIP (procalcitonin:4,lacticidven:4)  ) Recent Results (from the past 240 hour(s))  Blood Culture (routine x 2)     Status: None (Preliminary result)   Collection Time: 09/29/20   4:35 PM   Specimen: BLOOD LEFT HAND  Result Value Ref Range Status   Specimen Description BLOOD LEFT HAND  Final   Special Requests   Final    BOTTLES DRAWN AEROBIC AND ANAEROBIC Blood Culture adequate volume   Culture   Final    NO GROWTH 2 DAYS Performed at [facility], [address] [place], [place] [address]    Report Status PENDING  Incomplete  Blood Culture (routine x 2)     Status: None (Preliminary result)   Collection Time: 09/29/20  4:45 PM   Specimen: BLOOD RIGHT HAND  Result Value Ref Range Status   Specimen Description BLOOD RIGHT HAND  Final   Special Requests   Final    BOTTLES DRAWN AEROBIC AND ANAEROBIC Blood Culture results may not be optimal due to an inadequate volume of blood received in culture bottles   Culture   Final    NO GROWTH 2 DAYS Performed at [facility], [address] [place], [place] [address]    Report Status PENDING  Incomplete  Resp Panel by RT-PCR (Flu A&B, Covid) Nasopharyngeal Swab     Status: None   Collection Time: 09/29/20  5:07 PM   Specimen: Nasopharyngeal Swab; Nasopharyngeal(NP) swabs in vial transport medium  Result Value Ref Range Status   SARS Coronavirus 2 by RT PCR NEGATIVE NEGATIVE Final    Comment: (NOTE) SARS-CoV-2 target nucleic acids are NOT DETECTED.  The SARS-CoV-2 RNA is generally detectable in upper respiratory specimens during the acute phase of infection. The lowest concentration of SARS-CoV-2 viral copies this assay can detect is 138 copies/mL. A negative result does not preclude SARS-Cov-2 infection and should not be used as the sole basis for treatment or other patient management decisions. A negative result may occur with  improper specimen collection/handling, submission of specimen other than nasopharyngeal swab, presence of viral mutation(s) within the areas targeted by this assay, and inadequate number of viral copies(<138 copies/mL). A negative result must be combined with clinical observations,  patient history, and epidemiological information. The expected result is Negative.  Fact Sheet for Patients:  EntrepreneurPulse.com.au  Fact Sheet for Healthcare Providers:  IncredibleEmployment.be  This test is no t yet approved or cleared by the [place] FDA and  has been authorized for detection and/or diagnosis of SARS-CoV-2 by FDA under an Emergency Use Authorization (EUA). This EUA will remain  in effect (meaning this test can be used) for the duration of the COVID-19 declaration under Section 564(b)(1) of the Act, 21 U.S.C.section 360bbb-3(b)(1), unless the authorization is terminated  or revoked sooner.  Influenza A by PCR NEGATIVE NEGATIVE Final   Influenza B by PCR NEGATIVE NEGATIVE Final    Comment: (NOTE) The Xpert Xpress SARS-CoV-2/FLU/RSV plus assay is intended as an aid in the diagnosis of influenza from Nasopharyngeal swab specimens and should not be used as a sole basis for treatment. Nasal washings and aspirates are unacceptable for Xpert Xpress SARS-CoV-2/FLU/RSV testing.  Fact Sheet for Patients: EntrepreneurPulse.com.au  Fact Sheet for Healthcare Providers: IncredibleEmployment.be  This test is not yet approved or cleared by the [place] FDA and has been authorized for detection and/or diagnosis of SARS-CoV-2 by FDA under an Emergency Use Authorization (EUA). This EUA will remain in effect (meaning this test can be used) for the duration of the COVID-19 declaration under Section 564(b)(1) of the Act, 21 U.S.C. section 360bbb-3(b)(1), unless the authorization is terminated or revoked.  Performed at [facility], [address] [place], [place] [address]   Urine culture     Status: None (Preliminary result)   Collection Time: 09/29/20  5:51 PM   Specimen: In/Out Cath Urine  Result Value Ref Range Status   Specimen Description   Final    IN/OUT CATH URINE Performed  at [facility], [address] [place], [place] [address]    Special Requests   Final    NONE Performed at [facility], [address] [place], [place] [address]    Culture   Final    CULTURE REINCUBATED FOR BETTER GROWTH Performed at [facility], [place] [address] [place], [place] [address]    Report Status PENDING  Incomplete  Body fluid culture     Status: None (Preliminary result)   Collection Time: 09/30/20  9:38 AM   Specimen: Lung, Left; Pleural Fluid  Result Value Ref Range Status   Specimen Description PLEURAL FLUID  Final   Special Requests LEFT LUNG  Final   Gram Stain   Final    ABUNDANT WBC PRESENT,BOTH PMN AND MONONUCLEAR NO ORGANISMS SEEN    Culture   Final    NO GROWTH < 24 HOURS Performed at [facility], [place] [address], [place], [place] [address]    Report Status PENDING  Incomplete  Acid Fast Smear (AFB)     Status: None   Collection Time: 09/30/20  9:38 AM   Specimen: Lung, Left; Pleural Fluid  Result Value Ref Range Status   AFB Specimen Processing Concentration  Final   Acid Fast Smear Negative  Final    Comment: (NOTE) Performed At: [facility] [place], [place] [id number] Rush Farmer MD TK:[id number]    Source (AFB) PLEURAL  Final    Comment: FLUID LEFT LUNG Performed at [facility], [place] [address] [place], [place] [address]       Studies: DG CHEST PORT 1 VIEW  Result Date: 10/01/2020 CLINICAL DATA:  Cough, shortness of breath EXAM: PORTABLE CHEST 1 VIEW COMPARISON:  09/30/2020 chest radiograph and prior. FINDINGS: Increased size of large left pleural effusion with decreased left upper lung aeration. Right mid lung nodular opacity is unchanged. No pneumothorax. Right perihilar and basilar opacities, unchanged. Partial obscured cardiomediastinal silhouette. IMPRESSION: 1. Increased size of large left pleural effusion with decreased left upper lung aeration. 2. Right perihilar/basilar opacities are  grossly unchanged. Electronically Signed   By: Primitivo Gauze M.D.   On: 10/01/2020 10:42   VAS [place] LOWER EXTREMITY VENOUS (DVT)  Result Date: 10/01/2020  Lower Venous DVT Study Indications: Edema.  Comparison Study: no  prior Performing Technologist: Abram Sander RVS  Examination Guidelines: A complete evaluation includes B-mode imaging, spectral Doppler, color Doppler, and power Doppler as needed of all accessible portions of each vessel. Bilateral testing is considered an integral part of a complete examination. Limited examinations for reoccurring indications may be performed as noted. The reflux portion of the exam is performed with the patient in reverse Trendelenburg.  +---------+---------------+---------+-----------+----------+--------------+ RIGHT    CompressibilityPhasicitySpontaneityPropertiesThrombus Aging +---------+---------------+---------+-----------+----------+--------------+ CFV      Full           Yes      Yes                                 +---------+---------------+---------+-----------+----------+--------------+ SFJ      Full                                                        +---------+---------------+---------+-----------+----------+--------------+ FV Prox  Full                                                        +---------+---------------+---------+-----------+----------+--------------+ FV Mid   Full                                                        +---------+---------------+---------+-----------+----------+--------------+ FV DistalFull                                                        +---------+---------------+---------+-----------+----------+--------------+ PFV      Full                                                        +---------+---------------+---------+-----------+----------+--------------+ POP      Full           Yes      Yes                                  +---------+---------------+---------+-----------+----------+--------------+ PTV      Full                                                        +---------+---------------+---------+-----------+----------+--------------+ PERO     Full                                                        +---------+---------------+---------+-----------+----------+--------------+   +---------+---------------+---------+-----------+----------+--------------+  LEFT     CompressibilityPhasicitySpontaneityPropertiesThrombus Aging +---------+---------------+---------+-----------+----------+--------------+ CFV      Full           Yes      Yes                                 +---------+---------------+---------+-----------+----------+--------------+ SFJ      Full                                                        +---------+---------------+---------+-----------+----------+--------------+ FV Prox  Full                                                        +---------+---------------+---------+-----------+----------+--------------+ FV Mid   Full                                                        +---------+---------------+---------+-----------+----------+--------------+ FV DistalFull                                                        +---------+---------------+---------+-----------+----------+--------------+ PFV      Full                                                        +---------+---------------+---------+-----------+----------+--------------+ POP      Full           Yes      Yes                                 +---------+---------------+---------+-----------+----------+--------------+ PTV      Full                                                        +---------+---------------+---------+-----------+----------+--------------+ PERO     Full                                                         +---------+---------------+---------+-----------+----------+--------------+     Summary: BILATERAL: - No evidence of deep vein thrombosis seen in the lower extremities, bilaterally. - No evidence of superficial venous thrombosis in the lower extremities, bilaterally. -No evidence of popliteal cyst, bilaterally.   *See table(s) above for measurements and observations. Electronically  signed by Deitra Mayo MD on 10/01/2020 at 2:08:23 PM.    Final     Scheduled Meds: . aspirin EC  81 mg Oral Daily  . atorvastatin  20 mg Oral Daily  . metoprolol tartrate  25 mg Oral BID    Continuous Infusions: . heparin 1,950 Units/hr (10/01/20 1125)     LOS: 2 days     Kayleen Memos, MD [facility] Pager [phone]  If 7PM-7AM, please contact night-coverage [email or web address] Password [facility] 10/01/2020, 3:34 PM

## 2020-10-01 NOTE — Progress Notes (Signed)
ANTICOAGULATION CONSULT NOTE - Follow Up Consult  Pharmacy Consult for Heparin Indication: elevated troponin and rule out PE  Allergies  Allergen Reactions  . Contrast Media [Iodinated Diagnostic Agents]     Flushing, warmth, even with pre-medication    Patient Measurements: Height: 5\' 9"  (175.3 cm) Weight: 97.2 kg (214 lb 4.6 oz) IBW/kg (Calculated) : 70.7 Heparin Dosing Weight: 91 kg  Vital Signs: Temp: 99.1 F (37.3 C) (11/30 0020) Temp Source: Oral (11/30 0020) BP: 152/114 (11/30 0020) Pulse Rate: 105 (11/30 0020)  Labs: Recent Labs    09/29/20 1635 09/29/20 1635 09/29/20 2032 09/30/20 0213 09/30/20 0213 09/30/20 1000 09/30/20 1736 10/01/20 0130 10/01/20 0154  HGB 14.1   < >  --  13.2  --   --   --   --  13.0  HCT 44.2  --   --  41.6  --   --   --   --  42.2  PLT 306  --   --  278  --   --   --   --  266  APTT 28  --   --   --   --   --   --   --   --   LABPROT 14.3  --   --   --   --   --   --   --   --   INR 1.2  --   --   --   --   --   --   --   --   HEPARINUNFRC  --   --   --  <0.10*   < > 0.16* 0.21* 0.46  --   CREATININE 1.14  --   --  1.35*  --   --   --   --   --   TROPONINIHS 1,087*  --  1,380* 1,446*  --   --   --   --   --    < > = values in this interval not displayed.    Estimated Creatinine Clearance: 68.6 mL/min (A) (by C-G formula based on SCr of 1.35 mg/dL (H)).  Assessment:  59 yr old male admitted 11/28 pm with shortness of breath. Found to have large volume left pleural effusion, concern for metastatic malignancy. Refused CT to rule out PE due to reported hx contrast allergy even with premedication. Started on IV 11/28 pm.   S/p thoracentesis 11/29 (950 ml dark old blood removed)  Heparin level therapeutic (0.46) on gtt at 1950 units/hr. Hgb remains stable.   Goal of Therapy:  Heparin level 0.3-0.7 units/ml Monitor platelets by anticoagulation protocol: Yes   Plan:  Continue heparin drip to 1950 units/hr. Will f/u 6 hr  confirmatory heparin level  Sherlon Handing, PharmD, BCPS Please see amion for complete clinical pharmacist phone list 10/01/2020,2:53 AM

## 2020-10-01 NOTE — Progress Notes (Signed)
Lower extremity venous has been completed.   Preliminary results in CV Proc.   Abram Sander 10/01/2020 10:53 AM

## 2020-10-02 ENCOUNTER — Inpatient Hospital Stay (HOSPITAL_COMMUNITY): Payer: Self-pay

## 2020-10-02 DIAGNOSIS — Z9889 Other specified postprocedural states: Secondary | ICD-10-CM

## 2020-10-02 DIAGNOSIS — C799 Secondary malignant neoplasm of unspecified site: Secondary | ICD-10-CM

## 2020-10-02 DIAGNOSIS — I214 Non-ST elevation (NSTEMI) myocardial infarction: Secondary | ICD-10-CM

## 2020-10-02 HISTORY — PX: IR THORACENTESIS ASP PLEURAL SPACE W/IMG GUIDE: IMG5380

## 2020-10-02 LAB — CBC
HCT: 39.9 % (ref 39.0–52.0)
Hemoglobin: 12.4 g/dL — ABNORMAL LOW (ref 13.0–17.0)
MCH: 24.8 pg — ABNORMAL LOW (ref 26.0–34.0)
MCHC: 31.1 g/dL (ref 30.0–36.0)
MCV: 79.8 fL — ABNORMAL LOW (ref 80.0–100.0)
Platelets: 276 10*3/uL (ref 150–400)
RBC: 5 MIL/uL (ref 4.22–5.81)
RDW: 17 % — ABNORMAL HIGH (ref 11.5–15.5)
WBC: 11.5 10*3/uL — ABNORMAL HIGH (ref 4.0–10.5)
nRBC: 0 % (ref 0.0–0.2)

## 2020-10-02 LAB — BASIC METABOLIC PANEL
Anion gap: 11 (ref 5–15)
BUN: 14 mg/dL (ref 6–20)
CO2: 26 mmol/L (ref 22–32)
Calcium: 9 mg/dL (ref 8.9–10.3)
Chloride: 98 mmol/L (ref 98–111)
Creatinine, Ser: 1.32 mg/dL — ABNORMAL HIGH (ref 0.61–1.24)
GFR, Estimated: >60 mL/min (ref >60)
Glucose, Bld: 102 mg/dL — ABNORMAL HIGH (ref 70–99)
Potassium: 4.5 mmol/L (ref 3.5–5.1)
Sodium: 135 mmol/L (ref 135–145)

## 2020-10-02 LAB — HEPARIN LEVEL (UNFRACTIONATED): Heparin Unfractionated: 0.35 IU/mL (ref 0.30–0.70)

## 2020-10-02 LAB — MAGNESIUM: Magnesium: 1.8 mg/dL (ref 1.7–2.4)

## 2020-10-02 LAB — PROCALCITONIN: Procalcitonin: 0.25 ng/mL

## 2020-10-02 MED ORDER — LORAZEPAM 2 MG/ML IJ SOLN
2.0000 mg | Freq: Once | INTRAMUSCULAR | Status: AC | PRN
  Administered 2020-10-02: 2 mg via INTRAVENOUS
  Filled 2020-10-02: qty 1

## 2020-10-02 MED ORDER — LIDOCAINE HCL (PF) 1 % IJ SOLN
INTRAMUSCULAR | Status: DC | PRN
  Administered 2020-10-02: 10 mL

## 2020-10-02 MED ORDER — LIDOCAINE HCL 1 % IJ SOLN
INTRAMUSCULAR | Status: AC
  Filled 2020-10-02: qty 20

## 2020-10-02 MED ORDER — GUAIFENESIN-CODEINE 100-10 MG/5ML PO SOLN
10.0000 mL | Freq: Four times a day (QID) | ORAL | Status: DC | PRN
  Administered 2020-10-03 – 2020-10-16 (×15): 10 mL via ORAL
  Filled 2020-10-02 (×16): qty 10

## 2020-10-02 MED ORDER — LORAZEPAM 2 MG/ML IJ SOLN
2.0000 mg | INTRAMUSCULAR | Status: DC
Start: 2020-10-02 — End: 2020-10-02

## 2020-10-02 NOTE — Progress Notes (Signed)
PCCM Interval Note  Patient going downstairs now for repeat thoracentesis  Vitals:   10/02/20 0025 10/02/20 0500 10/02/20 0737 10/02/20 0811  BP: (!) 130/109 (!) 140/112 (!) 146/105 (!) 146/105  Pulse: (!) 106 (!) 101 (!) 109   Resp: 18 14 19    Temp: 99 F (37.2 C) 98.9 F (37.2 C) 99 F (37.2 C)   TempSrc: Oral Oral Oral   SpO2: 98% 99% 97%   Weight:      Height:       Comfortable in bed, NAD, no L BS, R clear. Exam stable.    Left pleural fluid cytology from 11/29 pending   Plans: We will plan to follow cytology results from 11/29 also from 12/1 for evidence of malignancy. Consider repeat CT chest to better evaluate left parenchyma once left effusion has been largely drained We will get a tissue diagnosis from the pleural fluid then would recommend bronchoscopy with EBUS.    Baltazar Apo, MD, PhD 10/02/2020, 8:48 AM [facility] [phone] or if no answer [phone]

## 2020-10-02 NOTE — Progress Notes (Signed)
PROGRESS NOTE    Brett Medina  EXH:[id number] DOB: September 28, 1961 DOA: 09/29/2020 PCP: Patient, No Pcp Per     Brief Narrative:  Brett Green [name] a 59 y.o.WM PMHx Tobacco abuse.   Presented to the ED with complaints of difficulty breathing ongoing over the past 1months at least. Reports he was seen at urgent care several x40 was given prednisone. Patient quit smoking cigarettes about 2 months ago. He denies chest pain. Has chronic unchanged bilateral lower extremity swelling. Reports a productive cough over the past few weeks. No family history of heart attacks.  ED Course:O2 sats 91 to 97% on room air. Tachycardic to 135, mild tachypnea to 22.Lactic acid 1.3. WBC 13.2. Troponin 1087. UA with moderate hemoglobin. Chest CT-Constellation of findings consistent with metastatic malignancy, moderate to large volume left pleural effusion with almost complete collapse of left upper and lower lobes(see detailed report).Patient reports contrast allergy, he refused CT with contrast to rule out PE as he wastachycardic. Ceftriaxone and doxycycline given for possible pneumonia, then discontinued. Heparin drip started.Hospitalist admit for large left pleural effusion.  Cardiology was consulted for elevated troponin, following.  Post left thoracentesis on 09/30/20 by IR 950 cc of dark bloody fluid removed.    2D echo 11/29 showing reduced LVEF 40-45% with global left ventricle hypokinesis.  B/L LE duplex US negative for DVT.  10/01/20: Seen and examined with his wife at bedside.  Noted persistent non productive cough on exam.  CXR independently reviewed showing recurrent large left pleural effusion.  IR consult for repeat left thoracentesis.  Pulmonary consulted to assist with the management.    Subjective: Afebrile overnight A/O x4, positive S OB.  Not on home O2 prior to admission.   Assessment & Plan: Covid vaccination;   Principal Problem:   Large pleural  effusion Active Problems:   Elevated troponin  Acute respiratory failure with hypoxia -Multifactorial acute CHF, neoplasm?,  AKI -11/29 s/p LEFT thoracentesis aspirated 950 cc fluid.  Awaiting cytology -PCXR 11/30 showed continue LEFT pleural effusion see results below -Titrate O2 to maintain SPO2> 92%, secondary to acute CHF -Incentive spirometry -Flutter valve -Xopenex QID   Elevated troponin, suspect demand ischemia in the setting of acute hypoxic respiratory failure Results for Brett Medina (MRN [id number]) as of 10/03/2020 06:45  Ref. Range 09/29/2020 16:35 09/29/2020 20:32 09/30/2020 02:13 10/01/2020 09:17  Troponin I (High Sensitivity) Latest Ref Range: <18 ng/L 1,087 (HH) 1,380 (HH) 1,446 (HH) 746 (HH)   -Patient denies any chest pain or any anginal symptoms. -No evidence of acute ischemia on twelve-lead EKG -ASA 81 mg daily  -Heparin drip; had declined CTA chest to rule out PE due to history of allergy to IV contrast even with pretreatment. -LDL 85, started  Atorvastatin 20 mg daily  -Cardiology following  Acute systolic CHF -Presented with elevated BNP, chronic B/L LE edema -LVEF 40-45% on 2D echo 11/29 -Start strict I&O and daily weight -Labetalol PRN -Metoprolol 25 mg BID  Persistent sinus tachycardia -TSH nornal -Continue IV heparin for now,his large pleural effusion likely driving his tachycardia,but if persistent tachycardia may need to rule out PE.(Currently refusing CT with contrast due to allergy reaction,even with premedication). -B/L LE duplex US negative for DVT  Large left pleural effusion, unclear etiology. -Most likely secondary to malignancy -12/1 repeat CT chest -12/1 CT abdomen and pelvis; cancer work-up -12/1 CT head; cancer work-up -12/1 prior to transport to radiology administer Ativan -12/1 guaifenesin+ codeine for refractory cough -After results of cytology come in from  thoracentesis PCCM most likely will proceed with EBUS to obtain  tissue sample.   Leukocytosis, downtrending, likely reactive in the setting of acute hypoxic respiratory failure with large left pleural effusion. Presented with WBC 13.2K, downtrending to 10.8K Currently not on antibiotics Continue to monitor fever curve and WBC  Chronic bilateral lower extremity edema -Elevate lower extremities  AKI Lab Results  Component Value Date   CREATININE 1.16 10/03/2020   CREATININE 1.32 (H) 10/02/2020   CREATININE 1.36 (H) 10/01/2020   CREATININE 1.35 (H) 09/30/2020   CREATININE 1.14 09/29/2020  -Resolved  DVT prophylaxis: Heparin drip Code Status: Full Family Communication: 12/1 wife at bedside for discussion of plan of care answered all questions Status is: Inpatient    Dispo: The patient is from: Home              Anticipated d/c is to: Home              Anticipated d/c date is:??              Patient currently unstable      Consultants:  PCCM Cardiology IR    Procedures/Significant Events:  11/30  LEFT thoracentesis aspirated 950 cc fluid 11/30 PCXR;Increased size of large left pleural effusion with decreased left upper lung aeration. -Right perihilar/basilar opacities are grossly unchanged 12/1 LEFT thoracentesis; aspirated 673ml maroon-colored fluid   I have personally reviewed and interpreted all radiology studies and my findings are as above.  VENTILATOR SETTINGS: Nasal cannula 12/1 Flow; 4 L/min SPO2; 97%   Cultures 11/30 LEFT thoracentesis fluid pending 12/1 LEFT thoracentesis fluid pending    Antimicrobials: Anti-infectives (From admission, onward)   Start     Ordered Stop   09/29/20 1630  cefTRIAXone (ROCEPHIN) 2 g in sodium chloride 0.9 % 100 mL IVPB  Status:  Discontinued        09/29/20 1624 09/29/20 2226   09/29/20 1630  azithromycin (ZITHROMAX) 500 mg in sodium chloride 0.9 % 250 mL IVPB  Status:  Discontinued        09/29/20 1624 09/29/20 2226       Devices    LINES / TUBES:       Continuous Infusions: . heparin 1,950 Units/hr (10/02/20 0158)     Objective: Vitals:   10/02/20 0737 10/02/20 0811 10/02/20 0916 10/02/20 1134  BP: (!) 146/105 (!) 146/105 (!) 134/112 (!) 136/97  Pulse: (!) 109  (!) 119 (!) 104  Resp: 19  20 20   Temp: 99 F (37.2 C)  99 F (37.2 C) 98.1 F (36.7 C)  TempSrc: Oral   Oral  SpO2: 97%  97% 97%  Weight:      Height:        Intake/Output Summary (Last 24 hours) at 10/02/2020 1159 Last data filed at 10/02/2020 7096 Gross per 24 hour  Intake 720 ml  Output 1100 ml  Net -380 ml   Filed Weights   09/29/20 1522 09/29/20 2220 10/01/20 0441  Weight: 99.8 kg 97.2 kg 91.2 kg    Examination:  General: A/O x4, positive acute respiratory distress Eyes: negative scleral hemorrhage, negative anisocoria, negative icterus ENT: Negative Runny nose, negative gingival bleeding, Neck:  Negative scars, masses, torticollis, lymphadenopathy, JVD Lungs: diffuse decreased breath sounds LEFT >>> RIGHT.  RLL positive expiratory wheeze.  LLL positive rasping sounds with inspiration/expiration  Cardiovascular: Regular rate and rhythm without murmur gallop or rub normal S1 and S2 Abdomen: negative abdominal pain, nondistended, positive soft, bowel sounds, no rebound, no ascites, no  appreciable mass Extremities: No significant cyanosis, clubbing, or edema bilateral lower extremities Skin: Negative rashes, lesions, ulcers Psychiatric:  Negative depression, negative anxiety, negative fatigue, negative mania  Central nervous system:  Cranial nerves II through XII intact, tongue/uvula midline, all extremities muscle strength 5/5, sensation intact throughout, negative dysarthria, negative expressive aphasia, negative receptive aphasia.  .     Data Reviewed: Care during the described time interval was provided by me .  I have reviewed this patient's available data, including medical history, events of note, physical examination, and all test results  as part of my evaluation.  CBC: Recent Labs  Lab 09/29/20 1635 09/30/20 0213 10/01/20 0154 10/02/20 0135  WBC 13.2* 10.8* 10.7* 11.5*  NEUTROABS 10.7*  --   --   --   HGB 14.1 13.2 13.0 12.4*  HCT 44.2 41.6 42.2 39.9  MCV 78.6* 78.3* 79.5* 79.8*  PLT 306 278 266 294   Basic Metabolic Panel: Recent Labs  Lab 09/29/20 1635 09/30/20 0213 10/01/20 0130 10/02/20 0135  NA 132* 136 133* 135  K 3.7 4.4 4.3 4.5  CL 100 100 99 98  CO2 22 26 28 26   GLUCOSE 113* 110* 94 102*  BUN 13 12 13 14   CREATININE 1.14 1.35* 1.36* 1.32*  CALCIUM 9.0 9.0 8.7* 9.0  MG  --   --   --  1.8   GFR: Estimated Creatinine Clearance: 68.1 mL/min (A) (by C-G formula based on SCr of 1.32 mg/dL (H)). Liver Function Tests: Recent Labs  Lab 09/29/20 1635  AST 33  ALT 29  ALKPHOS 90  BILITOT 0.6  PROT 6.3*  ALBUMIN 2.7*   No results for input(s): LIPASE, AMYLASE in the last 168 hours. No results for input(s): AMMONIA in the last 168 hours. Coagulation Profile: Recent Labs  Lab 09/29/20 1635  INR 1.2   Cardiac Enzymes: No results for input(s): CKTOTAL, CKMB, CKMBINDEX, TROPONINI in the last 168 hours. BNP (last 3 results) No results for input(s): PROBNP in the last 8760 hours. HbA1C: Recent Labs    09/30/20 0213  HGBA1C 5.7*   CBG: Recent Labs  Lab 10/01/20 0118 10/01/20 0638  GLUCAP 87 98   Lipid Profile: Recent Labs    09/30/20 0213  CHOL 141  HDL 37*  LDLCALC 85  TRIG 94  CHOLHDL 3.8   Thyroid Function Tests: Recent Labs    09/30/20 0213  TSH 1.901   Anemia Panel: No results for input(s): VITAMINB12, FOLATE, FERRITIN, TIBC, IRON, RETICCTPCT in the last 72 hours. Sepsis Labs: Recent Labs  Lab 09/29/20 1635 10/02/20 0135  PROCALCITON  --  0.25  LATICACIDVEN 1.3  --     Recent Results (from the past 240 hour(s))  Blood Culture (routine x 2)     Status: None (Preliminary result)   Collection Time: 09/29/20  4:35 PM   Specimen: BLOOD LEFT HAND  Result Value  Ref Range Status   Specimen Description BLOOD LEFT HAND  Final   Special Requests   Final    BOTTLES DRAWN AEROBIC AND ANAEROBIC Blood Culture adequate volume   Culture   Final    NO GROWTH 2 DAYS Performed at [facility], [address], [place], [place] [address]    Report Status PENDING  Incomplete  Blood Culture (routine x 2)     Status: None (Preliminary result)   Collection Time: 09/29/20  4:45 PM   Specimen: BLOOD RIGHT HAND  Result Value Ref Range Status   Specimen Description BLOOD RIGHT HAND  Final   Special Requests   Final    BOTTLES DRAWN AEROBIC AND ANAEROBIC Blood Culture results may not be optimal due to an inadequate volume of blood received in culture bottles   Culture   Final    NO GROWTH 2 DAYS Performed at [facility], [address], [place], [place] [address]    Report Status PENDING  Incomplete  Resp Panel by RT-PCR (Flu A&B, Covid) Nasopharyngeal Swab     Status: None   Collection Time: 09/29/20  5:07 PM   Specimen: Nasopharyngeal Swab; Nasopharyngeal(NP) swabs in vial transport medium  Result Value Ref Range Status   SARS Coronavirus 2 by RT PCR NEGATIVE NEGATIVE Final    Comment: (NOTE) SARS-CoV-2 target nucleic acids are NOT DETECTED.  The SARS-CoV-2 RNA is generally detectable in upper respiratory specimens during the acute phase of infection. The lowest concentration of SARS-CoV-2 viral copies this assay can detect is 138 copies/mL. A negative result does not preclude SARS-Cov-2 infection and should not be used as the sole basis for treatment or other patient management decisions. A negative result may occur with  improper specimen collection/handling, submission of specimen other than nasopharyngeal swab, presence of viral mutation(s) within the areas targeted by this assay, and inadequate number of viral copies(<138 copies/mL). A negative result must be combined with clinical observations, patient history, and epidemiological information. The  expected result is Negative.  Fact Sheet for Patients:  EntrepreneurPulse.com.au  Fact Sheet for Healthcare Providers:  IncredibleEmployment.be  This test is no t yet approved or cleared by the [place] FDA and  has been authorized for detection and/or diagnosis of SARS-CoV-2 by FDA under an Emergency Use Authorization (EUA). This EUA will remain  in effect (meaning this test can be used) for the duration of the COVID-19 declaration under Section 564(b)(1) of the Act, 21 U.S.C.section 360bbb-3(b)(1), unless the authorization is terminated  or revoked sooner.       Influenza A by PCR NEGATIVE NEGATIVE Final   Influenza B by PCR NEGATIVE NEGATIVE Final    Comment: (NOTE) The Xpert Xpress SARS-CoV-2/FLU/RSV plus assay is intended as an aid in the diagnosis of influenza from Nasopharyngeal swab specimens and should not be used as a sole basis for treatment. Nasal washings and aspirates are unacceptable for Xpert Xpress SARS-CoV-2/FLU/RSV testing.  Fact Sheet for Patients: EntrepreneurPulse.com.au  Fact Sheet for Healthcare Providers: IncredibleEmployment.be  This test is not yet approved or cleared by the [place] FDA and has been authorized for detection and/or diagnosis of SARS-CoV-2 by FDA under an Emergency Use Authorization (EUA). This EUA will remain in effect (meaning this test can be used) for the duration of the COVID-19 declaration under Section 564(b)(1) of the Act, 21 U.S.C. section 360bbb-3(b)(1), unless the authorization is terminated or revoked.  Performed at [facility], [address], [place], [place] [address]   Urine culture     Status: None (Preliminary result)   Collection Time: 09/29/20  5:51 PM   Specimen: In/Out Cath Urine  Result Value Ref Range Status   Specimen Description   Final    IN/OUT CATH URINE Performed at [facility], [address] [place], [place]  [address]    Special Requests   Final    NONE Performed at [facility], [address], [place], [place] [address]    Culture   Final    CULTURE REINCUBATED FOR BETTER GROWTH Performed at [facility], [place] [address], [place], [place] [address]  Report Status PENDING  Incomplete  Body fluid culture     Status: None (Preliminary result)   Collection Time: 09/30/20  9:38 AM   Specimen: Lung, Left; Pleural Fluid  Result Value Ref Range Status   Specimen Description PLEURAL FLUID  Final   Special Requests LEFT LUNG  Final   Gram Stain   Final    ABUNDANT WBC PRESENT,BOTH PMN AND MONONUCLEAR NO ORGANISMS SEEN    Culture   Final    NO GROWTH 2 DAYS Performed at [facility], [address] [place], [place] [address]    Report Status PENDING  Incomplete  Acid Fast Smear (AFB)     Status: None   Collection Time: 09/30/20  9:38 AM   Specimen: Lung, Left; Pleural Fluid  Result Value Ref Range Status   AFB Specimen Processing Concentration  Final   Acid Fast Smear Negative  Final    Comment: (NOTE) Performed At: [facility] [place], [place] [id number] Rush Farmer MD YN:[id number]    Source (AFB) PLEURAL  Final    Comment: FLUID LEFT LUNG Performed at [facility], [place] [address] [place],  [address]          Radiology Studies: DG Chest 1 View  Result Date: 10/02/2020 CLINICAL DATA:  Status post thoracentesis EXAM: CHEST  1 VIEW COMPARISON:  October 01, 2020 FINDINGS: No pneumothorax. There remains extensive opacity throughout the left lung, largely due to airspace consolidation but with questionable residual pleural effusion. Ill-defined opacity right base is stable. No new opacity evident. Stable cardiac prominence. Pulmonary vascular on the right appears unremarkable. Pulmonary vascularity on the left is obscured. No bone lesions. IMPRESSION: No pneumothorax. Opacification of most of the left hemithorax remains, likely due  to consolidation with questionable residual pleural effusion. Suspect patchy pneumonia right base, stable. No new opacity evident. Cardiac silhouette appears stable. Electronically Signed   By: Lowella Grip III M.D.   On: 10/02/2020 08:50   DG CHEST PORT 1 VIEW  Result Date: 10/01/2020 CLINICAL DATA:  Cough, shortness of breath EXAM: PORTABLE CHEST 1 VIEW COMPARISON:  09/30/2020 chest radiograph and prior. FINDINGS: Increased size of large left pleural effusion with decreased left upper lung aeration. Right mid lung nodular opacity is unchanged. No pneumothorax. Right perihilar and basilar opacities, unchanged. Partial obscured cardiomediastinal silhouette. IMPRESSION: 1. Increased size of large left pleural effusion with decreased left upper lung aeration. 2. Right perihilar/basilar opacities are grossly unchanged. Electronically Signed   By: Primitivo Gauze M.D.   On: 10/01/2020 10:42   ECHOCARDIOGRAM COMPLETE  Result Date: 09/30/2020    ECHOCARDIOGRAM REPORT   Patient Name:   Jaxston DARVELL MONTEFORTE Date of Exam: 09/30/2020 Medical Rec #:  [id number]     Height:       69.0 in Accession #:    [id number]    Weight:       214.3 lb Date of Birth:  10/12/1961      BSA:          2.127 m Patient Age:    26 years      BP:           151/111 mmHg Patient Gender: M             HR:           111 bpm. Exam Location:  Inpatient Procedure: 2D Echo, Cardiac Doppler and Color Doppler Indications:    Elevated Troponin  History:  Patient has no prior history of Echocardiogram examinations.                 Risk Factors:Former Smoker.  Sonographer:    Vickie Epley RDCS Referring Phys: [id number]  1. Left ventricular ejection fraction, by estimation, is 40 to 45%. The left ventricle has mildly decreased function. The left ventricle demonstrates global hypokinesis. Left ventricular diastolic parameters are indeterminate.  2. Right ventricular systolic function is normal. The right ventricular size  is normal. Tricuspid regurgitation signal is inadequate for assessing PA pressure.  3. The mitral valve is normal in structure. No evidence of mitral valve regurgitation. No evidence of mitral stenosis.  4. The aortic valve was not well visualized. Aortic valve regurgitation is trivial. No aortic stenosis is present.  5. Aortic dilatation noted. There is mild dilatation of the ascending aorta, measuring 37 mm.  6. The inferior vena cava is dilated in size with <50% respiratory variability, suggesting right atrial pressure of 15 mmHg. FINDINGS  Left Ventricle: Left ventricular ejection fraction, by estimation, is 40 to 45%. The left ventricle has mildly decreased function. The left ventricle demonstrates global hypokinesis. Definity contrast agent was given IV to delineate the left ventricular  endocardial borders. The left ventricular internal cavity size was normal in size. There is no left ventricular hypertrophy. Left ventricular diastolic parameters are indeterminate. Right Ventricle: The right ventricular size is normal. No increase in right ventricular wall thickness. Right ventricular systolic function is normal. Tricuspid regurgitation signal is inadequate for assessing PA pressure. Left Atrium: Left atrial size was normal in size. Right Atrium: Right atrial size was not well visualized. Pericardium: There is no evidence of pericardial effusion. Mitral Valve: The mitral valve is normal in structure. No evidence of mitral valve regurgitation. No evidence of mitral valve stenosis. Tricuspid Valve: The tricuspid valve is normal in structure. Tricuspid valve regurgitation is trivial. Aortic Valve: The aortic valve was not well visualized. Aortic valve regurgitation is trivial. No aortic stenosis is present. Pulmonic Valve: The pulmonic valve was not well visualized. Pulmonic valve regurgitation is not visualized. Aorta: The aortic root is normal in size and structure and aortic dilatation noted. There is mild  dilatation of the ascending aorta, measuring 37 mm. Venous: The inferior vena cava is dilated in size with less than 50% respiratory variability, suggesting right atrial pressure of 15 mmHg. IAS/Shunts: The interatrial septum was not well visualized.  LEFT VENTRICLE PLAX 2D LVIDd:         5.10 cm      Diastology LVIDs:         4.40 cm      LV e' medial:    4.46 cm/s LV PW:         0.90 cm      LV E/e' medial:  14.5 LV IVS:        0.90 cm      LV e' lateral:   7.07 cm/s LVOT diam:     2.10 cm      LV E/e' lateral: 9.1 LV SV:         50 LV SV Index:   23 LVOT Area:     3.46 cm  LV Volumes (MOD) LV vol d, MOD A2C: 102.0 ml LV vol d, MOD A4C: 131.0 ml LV vol s, MOD A2C: 68.2 ml LV vol s, MOD A4C: 81.5 ml LV SV MOD A2C:     33.8 ml LV SV MOD A4C:  131.0 ml LV SV MOD BP:      41.2 ml RIGHT VENTRICLE RV S prime:     19.40 cm/s TAPSE (M-mode): 2.2 cm LEFT ATRIUM             Index       RIGHT ATRIUM           Index LA diam:        2.30 cm 1.08 cm/m  RA Area:     11.50 cm LA Vol (A2C):   36.2 ml 17.02 ml/m RA Volume:   28.90 ml  13.58 ml/m LA Vol (A4C):   16.7 ml 7.85 ml/m LA Biplane Vol: 27.1 ml 12.74 ml/m  AORTIC VALVE LVOT Vmax:   99.20 cm/s LVOT Vmean:  73.000 cm/s LVOT VTI:    0.143 m  AORTA Ao Root diam: 3.90 cm Ao Asc diam:  3.70 cm Ao Desc diam: 2.60 cm MITRAL VALVE MV Area (PHT): 9.85 cm     SHUNTS MV Decel Time: 77 msec      Systemic VTI:  0.14 m MV E velocity: 64.50 cm/s   Systemic Diam: 2.10 cm MV A velocity: 101.00 cm/s MV E/A ratio:  0.64 Oswaldo Milian MD Electronically signed by Oswaldo Milian MD Signature Date/Time: 09/30/2020/9:27:23 PM    Final    VAS [place] LOWER EXTREMITY VENOUS (DVT)  Result Date: 10/01/2020  Lower Venous DVT Study Indications: Edema.  Comparison Study: no prior Performing Technologist: Abram Sander RVS  Examination Guidelines: A complete evaluation includes B-mode imaging, spectral Doppler, color Doppler, and power Doppler as needed of all accessible portions of  each vessel. Bilateral testing is considered an integral part of a complete examination. Limited examinations for reoccurring indications may be performed as noted. The reflux portion of the exam is performed with the patient in reverse Trendelenburg.  +---------+---------------+---------+-----------+----------+--------------+ RIGHT    CompressibilityPhasicitySpontaneityPropertiesThrombus Aging +---------+---------------+---------+-----------+----------+--------------+ CFV      Full           Yes      Yes                                 +---------+---------------+---------+-----------+----------+--------------+ SFJ      Full                                                        +---------+---------------+---------+-----------+----------+--------------+ FV Prox  Full                                                        +---------+---------------+---------+-----------+----------+--------------+ FV Mid   Full                                                        +---------+---------------+---------+-----------+----------+--------------+ FV DistalFull                                                        +---------+---------------+---------+-----------+----------+--------------+  PFV      Full                                                        +---------+---------------+---------+-----------+----------+--------------+ POP      Full           Yes      Yes                                 +---------+---------------+---------+-----------+----------+--------------+ PTV      Full                                                        +---------+---------------+---------+-----------+----------+--------------+ PERO     Full                                                        +---------+---------------+---------+-----------+----------+--------------+   +---------+---------------+---------+-----------+----------+--------------+ LEFT      CompressibilityPhasicitySpontaneityPropertiesThrombus Aging +---------+---------------+---------+-----------+----------+--------------+ CFV      Full           Yes      Yes                                 +---------+---------------+---------+-----------+----------+--------------+ SFJ      Full                                                        +---------+---------------+---------+-----------+----------+--------------+ FV Prox  Full                                                        +---------+---------------+---------+-----------+----------+--------------+ FV Mid   Full                                                        +---------+---------------+---------+-----------+----------+--------------+ FV DistalFull                                                        +---------+---------------+---------+-----------+----------+--------------+ PFV      Full                                                        +---------+---------------+---------+-----------+----------+--------------+  POP      Full           Yes      Yes                                 +---------+---------------+---------+-----------+----------+--------------+ PTV      Full                                                        +---------+---------------+---------+-----------+----------+--------------+ PERO     Full                                                        +---------+---------------+---------+-----------+----------+--------------+     Summary: BILATERAL: - No evidence of deep vein thrombosis seen in the lower extremities, bilaterally. - No evidence of superficial venous thrombosis in the lower extremities, bilaterally. -No evidence of popliteal cyst, bilaterally.   *See table(s) above for measurements and observations. Electronically signed by Deitra Mayo MD on 10/01/2020 at 2:08:23 PM.    Final    IR THORACENTESIS ASP PLEURAL SPACE W/IMG  GUIDE  Result Date: 10/02/2020 INDICATION: Patient with suspected lung cancer and recurrent left pleural effusions. Interventional radiology asked to perform a therapeutic thoracentesis. EXAM: ULTRASOUND GUIDED THORACENTESIS MEDICATIONS: 1% lidocaine 10 mL COMPLICATIONS: None immediate. PROCEDURE: An ultrasound guided thoracentesis was thoroughly discussed with the patient and questions answered. The benefits, risks, alternatives and complications were also discussed. The patient understands and wishes to proceed with the procedure. Written consent was obtained. Ultrasound was performed to localize and mark an adequate pocket of fluid in the left chest. The area was then prepped and draped in the normal sterile fashion. 1% Lidocaine was used for local anesthesia. Under ultrasound guidance a 6 Fr Safe-T-Centesis catheter was introduced. Thoracentesis was performed. The catheter was removed and a dressing applied. FINDINGS: A total of approximately 600 mL of maroon-colored fluid was removed. IMPRESSION: Successful ultrasound guided left thoracentesis yielding 600 mL of pleural fluid. Read by: Soyla Dryer, NP Electronically Signed   By: Markus Daft M.D.   On: 10/02/2020 09:28        Scheduled Meds: . aspirin EC  81 mg Oral Daily  . atorvastatin  20 mg Oral Daily  . metoprolol tartrate  25 mg Oral BID   Continuous Infusions: . heparin 1,950 Units/hr (10/02/20 0158)     LOS: 3 days    Time spent:40 min    Zameer Borman, Geraldo Docker, MD [facility] Pager [phone]  If 7PM-7AM, please contact night-coverage [email or web address] Password [email or web address] 10/02/2020, 11:59 AM

## 2020-10-02 NOTE — Progress Notes (Signed)
ANTICOAGULATION CONSULT NOTE - Follow Up Consult  Pharmacy Consult for Heparin Indication: elevated troponin and rule out PE  Allergies  Allergen Reactions  . Contrast Media [Iodinated Diagnostic Agents]     Flushing, warmth, even with pre-medication    Patient Measurements: Height: 5\' 9"  (175.3 cm) Weight: 91.2 kg (201 lb 1 oz) IBW/kg (Calculated) : 70.7 Heparin Dosing Weight: 91 kg  Vital Signs: Temp: 99 F (37.2 C) (12/01 0737) Temp Source: Oral (12/01 0737) BP: 146/105 (12/01 0737) Pulse Rate: 109 (12/01 0737)  Labs: Recent Labs    09/29/20 1635 09/29/20 1635 09/29/20 2032 09/30/20 0213 09/30/20 0213 09/30/20 1000 10/01/20 0130 10/01/20 0154 10/01/20 0907 10/01/20 0917 10/02/20 0135  HGB 14.1   < >  --  13.2   < >  --   --  13.0  --   --  12.4*  HCT 44.2   < >  --  41.6  --   --   --  42.2  --   --  39.9  PLT 306   < >  --  278  --   --   --  266  --   --  276  APTT 28  --   --   --   --   --   --   --   --   --   --   LABPROT 14.3  --   --   --   --   --   --   --   --   --   --   INR 1.2  --   --   --   --   --   --   --   --   --   --   HEPARINUNFRC  --   --   --  <0.10*  --    < > 0.46  --  0.42  --  0.35  CREATININE 1.14   < >  --  1.35*  --   --  1.36*  --   --   --  1.32*  TROPONINIHS 1,087*   < > 1,380* 1,446*  --   --   --   --   --  746*  --    < > = values in this interval not displayed.    Estimated Creatinine Clearance: 68.1 mL/min (A) (by C-G formula based on SCr of 1.32 mg/dL (H)).  Assessment:  59 yr old male admitted 11/28 pm with shortness of breath. Found to have large volume left pleural effusion, concern for metastatic malignancy. Refused CT to rule out PE due to reported hx contrast allergy even with premedication >flushing and warmth reported. No shortness of breath of skin reaction.  IV heparin begun 11/28 pm.   S/p thoracentesis 11/29 (950 ml dark old blood removed).   Heparin level remains therapeutic (0.35) on 1950 units/hr. CBC  stable today.  Duplex negative for DVT. No s/sx of bleeding.   Goal of Therapy:  Heparin level 0.3-0.7 units/ml Monitor platelets by anticoagulation protocol: Yes   Plan:  Continue heparin drip at 1950 units/hr. Daily heparin level and CBC while on heparin. F/u effusion cytology and further w/u on PE    Antonietta Jewel, PharmD, [place] Pharmacist  Phone: [phone] 10/02/2020 8:09 AM  Please check AMION for all [place] phone numbers After 10:00 PM, call [facility] [phone]

## 2020-10-02 NOTE — Progress Notes (Addendum)
Progress Note  Patient Name: Brett Medina Date of Encounter: 10/02/2020  [facility] Cardiologist: Elouise Munroe, MD   Subjective   No chest pain, + cough freq, had to stop thoracentesis due to cough,  700 ml removed.  Inpatient Medications    Scheduled Meds: . aspirin EC  81 mg Oral Daily  . atorvastatin  20 mg Oral Daily  . metoprolol tartrate  25 mg Oral BID   Continuous Infusions: . heparin 1,950 Units/hr (10/02/20 0158)   PRN Meds: acetaminophen **OR** acetaminophen, labetalol, lidocaine (PF), lidocaine, loperamide, ondansetron **OR** ondansetron (ZOFRAN) IV, polyethylene glycol   Vital Signs    Vitals:   10/02/20 0500 10/02/20 0737 10/02/20 0811 10/02/20 0916  BP: (!) 140/112 (!) 146/105 (!) 146/105 (!) 134/112  Pulse: (!) 101 (!) 109  (!) 119  Resp: 14 19  20   Temp: 98.9 F (37.2 C) 99 F (37.2 C)  99 F (37.2 C)  TempSrc: Oral Oral    SpO2: 99% 97%  97%  Weight:      Height:        Intake/Output Summary (Last 24 hours) at 10/02/2020 0957 Last data filed at 10/02/2020 3790 Gross per 24 hour  Intake 720 ml  Output 1100 ml  Net -380 ml   Last 3 Weights 10/01/2020 09/29/2020 09/29/2020  Weight (lbs) 201 lb 1 oz 214 lb 4.6 oz 220 lb  Weight (kg) 91.2 kg 97.2 kg 99.791 kg      Telemetry    ST to 125  Then down to 100 - Personally Reviewed  ECG    No new - Personally Reviewed  Physical Exam   GEN: No acute distress.   Neck: + JVD Cardiac: RRR, rapid no murmurs, rubs, or gallops.  Respiratory: diminished  to auscultation bilaterally. GI: Soft, nontender, non-distended  MS: No edema; No deformity. Neuro:  Nonfocal  Psych: Normal affect   Labs    High Sensitivity Troponin:   Recent Labs  Lab 09/29/20 1635 09/29/20 2032 09/30/20 0213 10/01/20 0917  TROPONINIHS 1,087* 1,380* 1,446* 746*      Chemistry Recent Labs  Lab 09/29/20 1635 09/29/20 1635 09/30/20 0213 10/01/20 0130 10/02/20 0135  NA 132*   < > 136 133* 135  K  3.7   < > 4.4 4.3 4.5  CL 100   < > 100 99 98  CO2 22   < > 26 28 26   GLUCOSE 113*   < > 110* 94 102*  BUN 13   < > 12 13 14   CREATININE 1.14   < > 1.35* 1.36* 1.32*  CALCIUM 9.0   < > 9.0 8.7* 9.0  PROT 6.3*  --   --   --   --   ALBUMIN 2.7*  --   --   --   --   AST 33  --   --   --   --   ALT 29  --   --   --   --   ALKPHOS 90  --   --   --   --   BILITOT 0.6  --   --   --   --   GFRNONAA >60   < > >60 >60 >60  ANIONGAP 10   < > 10 6 11    < > = values in this interval not displayed.     Hematology Recent Labs  Lab 09/30/20 0213 10/01/20 0154 10/02/20 0135  WBC 10.8* 10.7* 11.5*  RBC 5.31  5.31 5.00  HGB 13.2 13.0 12.4*  HCT 41.6 42.2 39.9  MCV 78.3* 79.5* 79.8*  MCH 24.9* 24.5* 24.8*  MCHC 31.7 30.8 31.1  RDW 17.1* 17.2* 17.0*  PLT 278 266 276    BNP Recent Labs  Lab 09/29/20 1635  BNP 205.0*     DDimer No results for input(s): DDIMER in the last 168 hours.   Radiology    DG Chest 1 View  Result Date: 10/02/2020 CLINICAL DATA:  Status post thoracentesis EXAM: CHEST  1 VIEW COMPARISON:  October 01, 2020 FINDINGS: No pneumothorax. There remains extensive opacity throughout the left lung, largely due to airspace consolidation but with questionable residual pleural effusion. Ill-defined opacity right base is stable. No new opacity evident. Stable cardiac prominence. Pulmonary vascular on the right appears unremarkable. Pulmonary vascularity on the left is obscured. No bone lesions. IMPRESSION: No pneumothorax. Opacification of most of the left hemithorax remains, likely due to consolidation with questionable residual pleural effusion. Suspect patchy pneumonia right base, stable. No new opacity evident. Cardiac silhouette appears stable. Electronically Signed   By: Lowella Grip III M.D.   On: 10/02/2020 08:50   DG CHEST PORT 1 VIEW  Result Date: 10/01/2020 CLINICAL DATA:  Cough, shortness of breath EXAM: PORTABLE CHEST 1 VIEW COMPARISON:  09/30/2020 chest  radiograph and prior. FINDINGS: Increased size of large left pleural effusion with decreased left upper lung aeration. Right mid lung nodular opacity is unchanged. No pneumothorax. Right perihilar and basilar opacities, unchanged. Partial obscured cardiomediastinal silhouette. IMPRESSION: 1. Increased size of large left pleural effusion with decreased left upper lung aeration. 2. Right perihilar/basilar opacities are grossly unchanged. Electronically Signed   By: Primitivo Gauze M.D.   On: 10/01/2020 10:42   ECHOCARDIOGRAM COMPLETE  Result Date: 09/30/2020    ECHOCARDIOGRAM REPORT   Patient Name:   Brett Medina Date of Exam: 09/30/2020 Medical Rec #:  [id number]     Height:       69.0 in Accession #:    [id number]    Weight:       214.3 lb Date of Birth:  04-08-1961      BSA:          2.127 m Patient Age:    59 years      BP:           151/111 mmHg Patient Gender: M             HR:           111 bpm. Exam Location:  Inpatient Procedure: 2D Echo, Cardiac Doppler and Color Doppler Indications:    Elevated Troponin  History:        Patient has no prior history of Echocardiogram examinations.                 Risk Factors:Former Smoker.  Sonographer:    Vickie Epley RDCS Referring Phys: [id number] [place]  1. Left ventricular ejection fraction, by estimation, is 40 to 45%. The left ventricle has mildly decreased function. The left ventricle demonstrates global hypokinesis. Left ventricular diastolic parameters are indeterminate.  2. Right ventricular systolic function is normal. The right ventricular size is normal. Tricuspid regurgitation signal is inadequate for assessing PA pressure.  3. The mitral valve is normal in structure. No evidence of mitral valve regurgitation. No evidence of mitral stenosis.  4. The aortic valve was not well visualized. Aortic valve regurgitation is trivial. No aortic stenosis is present.  5. Aortic dilatation noted.  There is mild dilatation of the ascending aorta,  measuring 37 mm.  6. The inferior vena cava is dilated in size with <50% respiratory variability, suggesting right atrial pressure of 15 mmHg. FINDINGS  Left Ventricle: Left ventricular ejection fraction, by estimation, is 40 to 45%. The left ventricle has mildly decreased function. The left ventricle demonstrates global hypokinesis. Definity contrast agent was given IV to delineate the left ventricular  endocardial borders. The left ventricular internal cavity size was normal in size. There is no left ventricular hypertrophy. Left ventricular diastolic parameters are indeterminate. Right Ventricle: The right ventricular size is normal. No increase in right ventricular wall thickness. Right ventricular systolic function is normal. Tricuspid regurgitation signal is inadequate for assessing PA pressure. Left Atrium: Left atrial size was normal in size. Right Atrium: Right atrial size was not well visualized. Pericardium: There is no evidence of pericardial effusion. Mitral Valve: The mitral valve is normal in structure. No evidence of mitral valve regurgitation. No evidence of mitral valve stenosis. Tricuspid Valve: The tricuspid valve is normal in structure. Tricuspid valve regurgitation is trivial. Aortic Valve: The aortic valve was not well visualized. Aortic valve regurgitation is trivial. No aortic stenosis is present. Pulmonic Valve: The pulmonic valve was not well visualized. Pulmonic valve regurgitation is not visualized. Aorta: The aortic root is normal in size and structure and aortic dilatation noted. There is mild dilatation of the ascending aorta, measuring 37 mm. Venous: The inferior vena cava is dilated in size with less than 50% respiratory variability, suggesting right atrial pressure of 15 mmHg. IAS/Shunts: The interatrial septum was not well visualized.  LEFT VENTRICLE PLAX 2D LVIDd:         5.10 cm      Diastology LVIDs:         4.40 cm      LV e' medial:    4.46 cm/s LV PW:         0.90 cm      LV  E/e' medial:  14.5 LV IVS:        0.90 cm      LV e' lateral:   7.07 cm/s LVOT diam:     2.10 cm      LV E/e' lateral: 9.1 LV SV:         50 LV SV Index:   23 LVOT Area:     3.46 cm  LV Volumes (MOD) LV vol d, MOD A2C: 102.0 ml LV vol d, MOD A4C: 131.0 ml LV vol s, MOD A2C: 68.2 ml LV vol s, MOD A4C: 81.5 ml LV SV MOD A2C:     33.8 ml LV SV MOD A4C:     131.0 ml LV SV MOD BP:      41.2 ml RIGHT VENTRICLE RV S prime:     19.40 cm/s TAPSE (M-mode): 2.2 cm LEFT ATRIUM             Index       RIGHT ATRIUM           Index LA diam:        2.30 cm 1.08 cm/m  RA Area:     11.50 cm LA Vol (A2C):   36.2 ml 17.02 ml/m RA Volume:   28.90 ml  13.58 ml/m LA Vol (A4C):   16.7 ml 7.85 ml/m LA Biplane Vol: 27.1 ml 12.74 ml/m  AORTIC VALVE LVOT Vmax:   99.20 cm/s LVOT Vmean:  73.000 cm/s LVOT VTI:    0.143 m  AORTA Ao Root diam: 3.90 cm Ao Asc diam:  3.70 cm Ao Desc diam: 2.60 cm MITRAL VALVE MV Area (PHT): 9.85 cm     SHUNTS MV Decel Time: 77 msec      Systemic VTI:  0.14 m MV E velocity: 64.50 cm/s   Systemic Diam: 2.10 cm MV A velocity: 101.00 cm/s MV E/A ratio:  0.64 Oswaldo Milian MD Electronically signed by Oswaldo Milian MD Signature Date/Time: 09/30/2020/9:27:23 PM    Final    VAS [place] LOWER EXTREMITY VENOUS (DVT)  Result Date: 10/01/2020  Lower Venous DVT Study Indications: Edema.  Comparison Study: no prior Performing Technologist: Abram Sander RVS  Examination Guidelines: A complete evaluation includes B-mode imaging, spectral Doppler, color Doppler, and power Doppler as needed of all accessible portions of each vessel. Bilateral testing is considered an integral part of a complete examination. Limited examinations for reoccurring indications may be performed as noted. The reflux portion of the exam is performed with the patient in reverse Trendelenburg.  +---------+---------------+---------+-----------+----------+--------------+ RIGHT    CompressibilityPhasicitySpontaneityPropertiesThrombus  Aging +---------+---------------+---------+-----------+----------+--------------+ CFV      Full           Yes      Yes                                 +---------+---------------+---------+-----------+----------+--------------+ SFJ      Full                                                        +---------+---------------+---------+-----------+----------+--------------+ FV Prox  Full                                                        +---------+---------------+---------+-----------+----------+--------------+ FV Mid   Full                                                        +---------+---------------+---------+-----------+----------+--------------+ FV DistalFull                                                        +---------+---------------+---------+-----------+----------+--------------+ PFV      Full                                                        +---------+---------------+---------+-----------+----------+--------------+ POP      Full           Yes      Yes                                 +---------+---------------+---------+-----------+----------+--------------+  PTV      Full                                                        +---------+---------------+---------+-----------+----------+--------------+ PERO     Full                                                        +---------+---------------+---------+-----------+----------+--------------+   +---------+---------------+---------+-----------+----------+--------------+ LEFT     CompressibilityPhasicitySpontaneityPropertiesThrombus Aging +---------+---------------+---------+-----------+----------+--------------+ CFV      Full           Yes      Yes                                 +---------+---------------+---------+-----------+----------+--------------+ SFJ      Full                                                         +---------+---------------+---------+-----------+----------+--------------+ FV Prox  Full                                                        +---------+---------------+---------+-----------+----------+--------------+ FV Mid   Full                                                        +---------+---------------+---------+-----------+----------+--------------+ FV DistalFull                                                        +---------+---------------+---------+-----------+----------+--------------+ PFV      Full                                                        +---------+---------------+---------+-----------+----------+--------------+ POP      Full           Yes      Yes                                 +---------+---------------+---------+-----------+----------+--------------+ PTV      Full                                                        +---------+---------------+---------+-----------+----------+--------------+  PERO     Full                                                        +---------+---------------+---------+-----------+----------+--------------+     Summary: BILATERAL: - No evidence of deep vein thrombosis seen in the lower extremities, bilaterally. - No evidence of superficial venous thrombosis in the lower extremities, bilaterally. -No evidence of popliteal cyst, bilaterally.   *See table(s) above for measurements and observations. Electronically signed by Deitra Mayo MD on 10/01/2020 at 2:08:23 PM.    Final     Cardiac Studies   Echo 09/30/2020 1. Left ventricular ejection fraction, by estimation, is 40 to 45%. The  left ventricle has mildly decreased function. The left ventricle  demonstrates global hypokinesis. Left ventricular diastolic parameters are  indeterminate.  2. Right ventricular systolic function is normal. The right ventricular  size is normal. Tricuspid regurgitation signal is inadequate for assessing   PA pressure.  3. The mitral valve is normal in structure. No evidence of mitral valve  regurgitation. No evidence of mitral stenosis.  4. The aortic valve was not well visualized. Aortic valve regurgitation  is trivial. No aortic stenosis is present.  5. Aortic dilatation noted. 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  6. The inferior vena cava is dilated in size with <50% respiratory  variability, suggesting right atrial pressure of 15 mmHg.   Patient Profile     59 y.o. male with no significant past medical history except for tobacco smoking and no regular primary care follow-upwho presented to emergency room  with 75-monthshistory of progressive worsening dyspnea on exertion, orthopnea, PND and lower extremity edema. Fould to have acute hypoxic respiratory failure and large L pleural effusion and elevated tropoins.   Assessment & Plan    1. Elevated troponin - Suspeced demand ischemia 2nd to respiratory issue - No chest pain or pressure with and without exertion  - EKG without ischemic changes -Hs-troponin [id number]>>[id number]>>746   - He does have cardiac risk factors for underlying CAD and coronary calcification on non contrast CT - Echo showed reduced LVEF at 40-45% and global hypokinesis - Continue ASA, statin, BB (increase dose today) and Iv heparin  - plan for cardiac cath once HF improved  Dr. Margaretann Loveless.  Has discussed with pt the risks involved  2. Acute hypoxic respiratory failure improved CCM following  3. Large left pleural effusion - S/p left thoracentesisyielding 950 cc of fluid removal by IR on 11/29 -Chest x-ray today post thoracentesis  No pneumothorax. Opacification of most of the left hemithorax remains, likely due to consolidation with questionable residual pleural effusion. Suspect patchy pneumonia right base, stable. No new opacity evident. Cardiac silhouette appears stable. -Non contrastCT of chest(patient reported dyeallergy  despite premedication)concerning for malignancy  -Pending cytology- CCM following and plan bronch with EBUS  4.Sinus tachycardia -Patient denies chest pain -His blood pressure has been stable -Given concern for pulmonary embolism andelevated troponin >>>Continue on IV heparin  -Thankfully normal RV function and size  - HR improving, increase metoprolol to 25mg  BID.   5.Bilateral lower extremity edema -BNP minimally elevated at 250 -Pending lower extremitydopplerto rule out DVT  6. Chronic systolic CHF - Net I & O negative 403 ml and wt down from 97.2 Kg to 91.2 Kg - Echo showed  reduced LVEF at 40-45% and global hypokinesis - Consolidate metoprolol to long acting and add ACE/ARB prior to discharge  7. Hx of at least 45 pack yr tobacco smoking - Says quit 2 months ago when his symptoms started   8.  Ascending aortic aneurysm measuring up to 4.5 cm will follow as outpt.   9.  HLD on lipitor 20 mg   10. HTN elevated today 134/112 on lopressor 25 mg BID       For questions or updates, please contact [place] Please consult [email or web address] for contact info under        Signed, Cecilie Kicks, NP  10/02/2020, 9:57 AM    Patient seen and examined with Cecilie Kicks, NP.  Agree as above, with the following exceptions and changes as noted below.  His coughing has subsided slightly.  He is anxiously awaiting cytology results which are not yet available.  Recently had his metoprolol, heart rate is approximately 99-100.  Gen: NAD, CV: RRR, no murmurs, Lungs: Diminished on the left, Abd: soft, Extrem: Warm, well perfused, no edema, Neuro/Psych: alert and oriented x 3, normal mood and affect. All available labs, radiology testing, previous records reviewed.  We discussed that ischemic evaluation may have to await further pulmonary evaluation because he cannot lie flat.  We will tentatively plan for a coronary angiogram early next week pending results of cytology, bronchoscopy,  and EBUS.  We will have to think carefully about PCI and DAPT if the patient requires surgical intervention for probable malignancy.  Elouise Munroe, MD 10/02/20 10:06 AM

## 2020-10-02 NOTE — Procedures (Addendum)
PROCEDURE SUMMARY:  Successful US guided left thoracentesis. Yielded 600 ml of maroon-colored fluid. Pt tolerated procedure well. No immediate complications.  CXR ordered; no post-procedure pneumothorax  EBL < 2 mL  Theresa Duty, NP 10/02/2020 9:29 AM

## 2020-10-03 ENCOUNTER — Encounter (HOSPITAL_COMMUNITY): Payer: Self-pay | Admitting: Internal Medicine

## 2020-10-03 LAB — COMPREHENSIVE METABOLIC PANEL
ALT: 26 U/L (ref 0–44)
AST: 30 U/L (ref 15–41)
Albumin: 2.1 g/dL — ABNORMAL LOW (ref 3.5–5.0)
Alkaline Phosphatase: 88 U/L (ref 38–126)
Anion gap: 13 (ref 5–15)
BUN: 10 mg/dL (ref 6–20)
CO2: 23 mmol/L (ref 22–32)
Calcium: 8.8 mg/dL — ABNORMAL LOW (ref 8.9–10.3)
Chloride: 99 mmol/L (ref 98–111)
Creatinine, Ser: 1.16 mg/dL (ref 0.61–1.24)
GFR, Estimated: >60 mL/min (ref >60)
Glucose, Bld: 102 mg/dL — ABNORMAL HIGH (ref 70–99)
Potassium: 3.8 mmol/L (ref 3.5–5.1)
Sodium: 135 mmol/L (ref 135–145)
Total Bilirubin: 0.6 mg/dL (ref 0.3–1.2)
Total Protein: 5.2 g/dL — ABNORMAL LOW (ref 6.5–8.1)

## 2020-10-03 LAB — CBC WITH DIFFERENTIAL/PLATELET
Abs Immature Granulocytes: 0.09 10*3/uL — ABNORMAL HIGH (ref 0.00–0.07)
Basophils Absolute: 0 10*3/uL (ref 0.0–0.1)
Basophils Relative: 0 %
Eosinophils Absolute: 0.1 10*3/uL (ref 0.0–0.5)
Eosinophils Relative: 1 %
HCT: 37.2 % — ABNORMAL LOW (ref 39.0–52.0)
Hemoglobin: 12.5 g/dL — ABNORMAL LOW (ref 13.0–17.0)
Immature Granulocytes: 1 %
Lymphocytes Relative: 7 %
Lymphs Abs: 0.7 10*3/uL (ref 0.7–4.0)
MCH: 26 pg (ref 26.0–34.0)
MCHC: 33.6 g/dL (ref 30.0–36.0)
MCV: 77.3 fL — ABNORMAL LOW (ref 80.0–100.0)
Monocytes Absolute: 1.2 10*3/uL — ABNORMAL HIGH (ref 0.1–1.0)
Monocytes Relative: 11 %
Neutro Abs: 8.6 10*3/uL — ABNORMAL HIGH (ref 1.7–7.7)
Neutrophils Relative %: 80 %
Platelets: 267 10*3/uL (ref 150–400)
RBC: 4.81 MIL/uL (ref 4.22–5.81)
RDW: 16.9 % — ABNORMAL HIGH (ref 11.5–15.5)
WBC: 10.7 10*3/uL — ABNORMAL HIGH (ref 4.0–10.5)
nRBC: 0 % (ref 0.0–0.2)

## 2020-10-03 LAB — BODY FLUID CULTURE: Culture: NO GROWTH

## 2020-10-03 LAB — HEPARIN LEVEL (UNFRACTIONATED)
Heparin Unfractionated: 0.22 IU/mL — ABNORMAL LOW (ref 0.30–0.70)
Heparin Unfractionated: 0.36 IU/mL (ref 0.30–0.70)

## 2020-10-03 LAB — URINE CULTURE: Culture: 1000 — AB

## 2020-10-03 LAB — PHOSPHORUS: Phosphorus: 3.7 mg/dL (ref 2.5–4.6)

## 2020-10-03 LAB — MAGNESIUM: Magnesium: 1.6 mg/dL — ABNORMAL LOW (ref 1.7–2.4)

## 2020-10-03 MED ORDER — MAGNESIUM SULFATE 2 GM/50ML IV SOLN
2.0000 g | Freq: Once | INTRAVENOUS | Status: AC
  Administered 2020-10-03: 2 g via INTRAVENOUS
  Filled 2020-10-03: qty 50

## 2020-10-03 MED ORDER — PREDNISONE 20 MG PO TABS
50.0000 mg | ORAL_TABLET | Freq: Once | ORAL | Status: AC
  Administered 2020-10-04: 50 mg via ORAL
  Filled 2020-10-03: qty 3

## 2020-10-03 MED ORDER — SODIUM CHLORIDE 0.9% FLUSH: 3.0000 mL | INTRAVENOUS | Status: DC | PRN

## 2020-10-03 MED ORDER — DIPHENHYDRAMINE HCL 25 MG PO CAPS: 50.0000 mg | ORAL_CAPSULE | Freq: Once | ORAL | Status: AC

## 2020-10-03 MED ORDER — LEVALBUTEROL HCL 1.25 MG/0.5ML IN NEBU
1.2500 mg | INHALATION_SOLUTION | Freq: Four times a day (QID) | RESPIRATORY_TRACT | Status: DC
  Administered 2020-10-03 – 2020-10-05 (×9): 1.25 mg via RESPIRATORY_TRACT
  Filled 2020-10-03 (×8): qty 0.5

## 2020-10-03 MED ORDER — SODIUM CHLORIDE 0.9% FLUSH
3.0000 mL | Freq: Two times a day (BID) | INTRAVENOUS | Status: DC
  Administered 2020-10-04 – 2020-10-16 (×12): 3 mL via INTRAVENOUS

## 2020-10-03 MED ORDER — SODIUM CHLORIDE 0.9 % IV SOLN: 250.0000 mL | INTRAVENOUS | Status: DC | PRN

## 2020-10-03 MED ORDER — DIPHENHYDRAMINE HCL 25 MG PO CAPS
50.0000 mg | ORAL_CAPSULE | Freq: Once | ORAL | Status: AC
  Filled 2020-10-03: qty 2

## 2020-10-03 MED ORDER — LEVALBUTEROL HCL 1.25 MG/0.5ML IN NEBU
1.2500 mg | INHALATION_SOLUTION | Freq: Four times a day (QID) | RESPIRATORY_TRACT | Status: DC
  Administered 2020-10-03: 1.25 mg via RESPIRATORY_TRACT

## 2020-10-03 MED ORDER — DIPHENHYDRAMINE HCL 50 MG/ML IJ SOLN
50.0000 mg | Freq: Once | INTRAMUSCULAR | Status: AC
  Administered 2020-10-03: 50 mg via INTRAVENOUS

## 2020-10-03 MED ORDER — DIPHENHYDRAMINE HCL 50 MG/ML IJ SOLN
50.0000 mg | Freq: Once | INTRAMUSCULAR | Status: AC
  Administered 2020-10-04: 50 mg via INTRAVENOUS
  Filled 2020-10-03: qty 1

## 2020-10-03 MED ORDER — PREDNISONE 20 MG PO TABS
50.0000 mg | ORAL_TABLET | Freq: Four times a day (QID) | ORAL | Status: AC
  Administered 2020-10-03 – 2020-10-04 (×3): 50 mg via ORAL
  Filled 2020-10-03 (×3): qty 2

## 2020-10-03 MED ORDER — LEVALBUTEROL HCL 1.25 MG/0.5ML IN NEBU: 1.2500 mg | INHALATION_SOLUTION | Freq: Four times a day (QID) | RESPIRATORY_TRACT | Status: DC

## 2020-10-03 MED ORDER — ASPIRIN 81 MG PO CHEW
81.0000 mg | CHEWABLE_TABLET | ORAL | Status: AC
  Administered 2020-10-04: 81 mg via ORAL
  Filled 2020-10-03: qty 1

## 2020-10-03 MED ORDER — SODIUM CHLORIDE 0.9 % IV SOLN: INTRAVENOUS | Status: DC

## 2020-10-03 NOTE — Progress Notes (Addendum)
ANTICOAGULATION CONSULT NOTE - Follow Up Consult  Pharmacy Consult for Heparin Indication: elevated troponin and rule out PE  Allergies  Allergen Reactions  . Contrast Media [Iodinated Diagnostic Agents]     Flushing, warmth, even with pre-medication    Patient Measurements: Height: 5\' 9"  (175.3 cm) Weight: 89.9 kg (198 lb 3.2 oz) IBW/kg (Calculated) : 70.7 Heparin Dosing Weight: 91 kg  Vital Signs: Temp: 99.1 F (37.3 C) (12/02 0434) Temp Source: Oral (12/02 0434) BP: 129/99 (12/02 0434) Pulse Rate: 120 (12/02 0434)  Labs: Recent Labs    10/01/20 0130 10/01/20 0130 10/01/20 0154 10/01/20 0154 10/01/20 0907 10/01/20 0917 10/02/20 0135 10/03/20 0323  HGB  --   --  13.0   < >  --   --  12.4* 12.5*  HCT  --   --  42.2  --   --   --  39.9 37.2*  PLT  --   --  266  --   --   --  276 267  HEPARINUNFRC 0.46   < >  --   --  0.42  --  0.35 0.22*  CREATININE 1.36*  --   --   --   --   --  1.32* 1.16  TROPONINIHS  --   --   --   --   --  746*  --   --    < > = values in this interval not displayed.    Estimated Creatinine Clearance: 77 mL/min (by C-G formula based on SCr of 1.16 mg/dL).  Assessment:  59 yr old [place] admitted 11/28 pm with shortness of breath. Found to have large volume left pleural effusion, concern for metastatic malignancy. Refused CT to rule out PE due to reported hx contrast allergy even with premedication >flushing and warmth reported. No shortness of breath of skin reaction.  IV heparin begun 11/28 pm.   S/p thoracentesis 11/29 (950 ml dark old blood removed).   Heparin level is substherapeutic today at 0.22, on 1950 units/hr. CBC stable today.  Duplex negative for DVT. No s/sx of bleeding.   Goal of Therapy:  Heparin level 0.3-0.7 units/ml Monitor platelets by anticoagulation protocol: Yes   Plan:  Increase heparin drip to 2100 units/hr. Daily heparin level and CBC while on heparin. F/u [facility] plans following cytology and EBUS    Antonietta Jewel,  PharmD, [place] Pharmacist  Phone: [phone] 10/03/2020 7:19 AM  Please check AMION for all [place] phone numbers After 10:00 PM, call [place] [phone]  ADDENDUM Heparin level came back therapeutic at 0.36, on 2100 units/hr. No s/sx of bleeding or infusion issues. Continue at same rate and get level with AM labs.   Antonietta Jewel, PharmD, [place] Clinical Pharmacist

## 2020-10-03 NOTE — Progress Notes (Signed)
PROGRESS NOTE    Brett Medina  RKY:[id number] DOB: 1961-09-16 DOA: 09/29/2020 PCP: Patient, No Pcp Per     Brief Narrative:  Brett Medina a 59 y.o.WM PMHx Tobacco abuse.   Presented to the ED with complaints of difficulty breathing ongoing over the past 61months at least. Reports he was seen at urgent care several x40 was given prednisone. Patient quit smoking cigarettes about 2 months ago. He denies chest pain. Has chronic unchanged bilateral lower extremity swelling. Reports a productive cough over the past few weeks. No family history of heart attacks.  ED Course:O2 sats 91 to 97% on room air. Tachycardic to 135, mild tachypnea to 22.Lactic acid 1.3. WBC 13.2. Troponin 1087. UA with moderate hemoglobin. Chest CT-Constellation of findings consistent with metastatic malignancy, moderate to large volume left pleural effusion with almost complete collapse of left upper and lower lobes(see detailed report).Patient reports contrast allergy, he refused CT with contrast to rule out PE as he wastachycardic. Ceftriaxone and doxycycline given for possible pneumonia, then discontinued. Heparin drip started.Hospitalist admit for large left pleural effusion.  Cardiology was consulted for elevated troponin, following.  Post left thoracentesis on 09/30/20 by IR 950 cc of dark bloody fluid removed.    2D echo 11/29 showing reduced LVEF 40-45% with global left ventricle hypokinesis.  B/L LE duplex US negative for DVT.  10/01/20: Seen and examined with his wife at bedside.  Noted persistent non productive cough on exam.  CXR independently reviewed showing recurrent large left pleural effusion.  IR consult for repeat left thoracentesis.  Pulmonary consulted to assist with the management.    Subjective: 12/2 afebrile overnight, A/O x4.  Positive S OB.  Cough somewhat better controlled.      Assessment & Plan: Covid vaccination;   Principal Problem:   Large pleural  effusion Active Problems:   Elevated troponin  Acute respiratory failure with hypoxia -Multifactorial acute CHF, neoplasm?,  AKI -11/29 s/p LEFT thoracentesis aspirated 950 cc fluid.  Awaiting cytology -PCXR 11/30 showed continue LEFT pleural effusion see results below -Titrate O2 to maintain SPO2> 92%, secondary to acute CHF -Incentive spirometry -Flutter valve -Xopenex QID   Elevated troponin, suspect demand ischemia in the setting of acute hypoxic respiratory failure Results for Brett Medina, Brett Medina (MRN [id number]) as of 10/03/2020 06:45  Ref. Range 09/29/2020 16:35 09/29/2020 20:32 09/30/2020 02:13 10/01/2020 09:17  Troponin I (High Sensitivity) Latest Ref Range: <18 ng/L 1,087 (HH) 1,380 (HH) 1,446 (HH) 746 (HH)   -Patient denies any chest pain or any anginal symptoms. -No evidence of acute ischemia on twelve-lead EKG -ASA 81 mg daily  -Heparin drip; had declined CTA chest to rule out PE due to history of allergy to IV contrast even with pretreatment. -LDL 85, started  Atorvastatin 20 mg daily  -Cardiology following  Acute systolic CHF -Presented with elevated BNP, chronic B/L LE edema -LVEF 40-45% on 2D echo 11/29 -Start strict I&O and daily weight -Labetalol PRN -Metoprolol 25 mg BID  Persistent sinus tachycardia -TSH nornal -Continue IV heparin for now,his large pleural effusion likely driving his tachycardia,but if persistent tachycardia may need to rule out PE.(Currently refusing CT with contrast due to allergy reaction,even with premedication). -B/L LE duplex US negative for DVT  Large left pleural effusion, unclear etiology. -Most likely secondary to malignancy -12/1 repeat CT chest -12/1 CT abdomen and pelvis; cancer work-up -12/1 CT head; cancer work-up -12/1 prior to transport to radiology administer Ativan -12/1 guaifenesin+ codeine for refractory cough -After results of cytology come  in from thoracentesis PCCM most likely Brett proceed with EBUS to obtain  tissue sample.   Leukocytosis, downtrending, likely reactive in the setting of acute hypoxic respiratory failure with large left pleural effusion. Presented with WBC 13.2K, downtrending to 10.8K Currently not on antibiotics Continue to monitor fever curve and WBC  Chronic bilateral lower extremity edema -Elevate lower extremities  AKI Lab Results  Component Value Date   CREATININE 1.16 10/03/2020   CREATININE 1.32 (H) 10/02/2020   CREATININE 1.36 (H) 10/01/2020   CREATININE 1.35 (H) 09/30/2020   CREATININE 1.14 09/29/2020  -Resolved  DVT prophylaxis: Heparin drip Code Status: Full Family Communication: 12/2 daughter at bedside for discussion of plan of care answered all questions Status is: Inpatient    Dispo: The patient is from: Home              Anticipated d/c is to: Home              Anticipated d/c date is:??              Patient currently unstable      Consultants:  PCCM Cardiology IR    Procedures/Significant Events:  11/30  LEFT thoracentesis aspirated 950 cc fluid 11/30 PCXR;Increased size of large left pleural effusion with decreased left upper lung aeration. -Right perihilar/basilar opacities are grossly unchanged 12/1 LEFT thoracentesis; aspirated 627ml maroon-colored fluid   I have personally reviewed and interpreted all radiology studies and my findings are as above.  VENTILATOR SETTINGS: Nasal cannula 12/1 Flow; 4 L/min SPO2; 97%   Cultures 11/30 LEFT thoracentesis fluid pending 12/1 LEFT thoracentesis fluid pending    Antimicrobials: Anti-infectives (From admission, onward)   Start     Ordered Stop   09/29/20 1630  cefTRIAXone (ROCEPHIN) 2 g in sodium chloride 0.9 % 100 mL IVPB  Status:  Discontinued        09/29/20 1624 09/29/20 2226   09/29/20 1630  azithromycin (ZITHROMAX) 500 mg in sodium chloride 0.9 % 250 mL IVPB  Status:  Discontinued        09/29/20 1624 09/29/20 2226       Devices    LINES / TUBES:       Continuous Infusions: . sodium chloride    . [START ON 10/04/2020] sodium chloride    . heparin 2,100 Units/hr (10/03/20 0926)     Objective: Vitals:   10/03/20 1436 10/03/20 1636 10/03/20 1940 10/03/20 2024  BP:  (!) 132/96 137/83   Pulse:  (!) 117 66   Resp:  20 (!) 22   Temp:  97.8 F (36.6 C) 99.4 F (37.4 C)   TempSrc:  Oral Oral   SpO2: 97% 95% 97% 95%  Weight:      Height:        Intake/Output Summary (Last 24 hours) at 10/03/2020 2045 Last data filed at 10/03/2020 0810 Gross per 24 hour  Intake --  Output 325 ml  Net -325 ml   Filed Weights   09/29/20 2220 10/01/20 0441 10/03/20 0434  Weight: 97.2 kg 91.2 kg 89.9 kg    Examination:  General: A/O x4, positive acute respiratory distress Eyes: negative scleral hemorrhage, negative anisocoria, negative icterus ENT: Negative Runny nose, negative gingival bleeding, Neck:  Negative scars, masses, torticollis, lymphadenopathy, JVD Lungs: diffuse decreased breath sounds LEFT >>> RIGHT.  RLL positive expiratory wheeze.  LLL positive rasping sounds with inspiration/expiration  Cardiovascular: Regular rate and rhythm without murmur gallop or rub normal S1 and S2 Abdomen: negative abdominal pain, nondistended,  positive soft, bowel sounds, no rebound, no ascites, no appreciable mass Extremities: No significant cyanosis, clubbing, or edema bilateral lower extremities Skin: Negative rashes, lesions, ulcers Psychiatric:  Negative depression, negative anxiety, negative fatigue, negative mania  Central nervous system:  Cranial nerves II through XII intact, tongue/uvula midline, all extremities muscle strength 5/5, sensation intact throughout, negative dysarthria, negative expressive aphasia, negative receptive aphasia.  .     Data Reviewed: Care during the described time interval was provided by me .  I have reviewed this patient's available data, including medical history, events of note, physical examination, and all  test results as part of my evaluation.  CBC: Recent Labs  Lab 09/29/20 1635 09/30/20 0213 10/01/20 0154 10/02/20 0135 10/03/20 0323  WBC 13.2* 10.8* 10.7* 11.5* 10.7*  NEUTROABS 10.7*  --   --   --  8.6*  HGB 14.1 13.2 13.0 12.4* 12.5*  HCT 44.2 41.6 42.2 39.9 37.2*  MCV 78.6* 78.3* 79.5* 79.8* 77.3*  PLT 306 278 266 276 756   Basic Metabolic Panel: Recent Labs  Lab 09/29/20 1635 09/30/20 0213 10/01/20 0130 10/02/20 0135 10/03/20 0323  NA 132* 136 133* 135 135  K 3.7 4.4 4.3 4.5 3.8  CL 100 100 99 98 99  CO2 22 26 28 26 23   GLUCOSE 113* 110* 94 102* 102*  BUN 13 12 13 14 10   CREATININE 1.14 1.35* 1.36* 1.32* 1.16  CALCIUM 9.0 9.0 8.7* 9.0 8.8*  MG  --   --   --  1.8 1.6*  PHOS  --   --   --   --  3.7   GFR: Estimated Creatinine Clearance: 77 mL/min (by C-G formula based on SCr of 1.16 mg/dL). Liver Function Tests: Recent Labs  Lab 09/29/20 1635 10/03/20 0323  AST 33 30  ALT 29 26  ALKPHOS 90 88  BILITOT 0.6 0.6  PROT 6.3* 5.2*  ALBUMIN 2.7* 2.1*   No results for input(s): LIPASE, AMYLASE in the last 168 hours. No results for input(s): AMMONIA in the last 168 hours. Coagulation Profile: Recent Labs  Lab 09/29/20 1635  INR 1.2   Cardiac Enzymes: No results for input(s): CKTOTAL, CKMB, CKMBINDEX, TROPONINI in the last 168 hours. BNP (last 3 results) No results for input(s): PROBNP in the last 8760 hours. HbA1C: No results for input(s): HGBA1C in the last 72 hours. CBG: Recent Labs  Lab 10/01/20 0118 10/01/20 0638  GLUCAP 87 98   Lipid Profile: No results for input(s): CHOL, HDL, LDLCALC, TRIG, CHOLHDL, LDLDIRECT in the last 72 hours. Thyroid Function Tests: No results for input(s): TSH, T4TOTAL, FREET4, T3FREE, THYROIDAB in the last 72 hours. Anemia Panel: No results for input(s): VITAMINB12, FOLATE, FERRITIN, TIBC, IRON, RETICCTPCT in the last 72 hours. Sepsis Labs: Recent Labs  Lab 09/29/20 1635 10/02/20 0135  PROCALCITON  --  0.25   LATICACIDVEN 1.3  --     Recent Results (from the past 240 hour(s))  Blood Culture (routine x 2)     Status: None (Preliminary result)   Collection Time: 09/29/20  4:35 PM   Specimen: BLOOD LEFT HAND  Result Value Ref Range Status   Specimen Description BLOOD LEFT HAND  Final   Special Requests   Final    BOTTLES DRAWN AEROBIC AND ANAEROBIC Blood Culture adequate volume   Culture   Final    NO GROWTH 4 DAYS Performed at [facility], [address] [place], [place] [address]    Report Status PENDING  Incomplete  Blood  Culture (routine x 2)     Status: None (Preliminary result)   Collection Time: 09/29/20  4:45 PM   Specimen: BLOOD RIGHT HAND  Result Value Ref Range Status   Specimen Description BLOOD RIGHT HAND  Final   Special Requests   Final    BOTTLES DRAWN AEROBIC AND ANAEROBIC Blood Culture results may not be optimal due to an inadequate volume of blood received in culture bottles   Culture   Final    NO GROWTH 4 DAYS Performed at [facility], [address] [place], [place] [address]    Report Status PENDING  Incomplete  Resp Panel by RT-PCR (Flu A&B, Covid) Nasopharyngeal Swab     Status: None   Collection Time: 09/29/20  5:07 PM   Specimen: Nasopharyngeal Swab; Nasopharyngeal(NP) swabs in vial transport medium  Result Value Ref Range Status   SARS Coronavirus 2 by RT PCR NEGATIVE NEGATIVE Final    Comment: (NOTE) SARS-CoV-2 target nucleic acids are NOT DETECTED.  The SARS-CoV-2 RNA is generally detectable in upper respiratory specimens during the acute phase of infection. The lowest concentration of SARS-CoV-2 viral copies this assay can detect is 138 copies/mL. A negative result does not preclude SARS-Cov-2 infection and should not be used as the sole basis for treatment or other patient management decisions. A negative result may occur with  improper specimen collection/handling, submission of specimen other than nasopharyngeal swab, presence of viral  mutation(s) within the areas targeted by this assay, and inadequate number of viral copies(<138 copies/mL). A negative result must be combined with clinical observations, patient history, and epidemiological information. The expected result is Negative.  Fact Sheet for Patients:  EntrepreneurPulse.com.au  Fact Sheet for Healthcare Providers:  IncredibleEmployment.be  This test is no t yet approved or cleared by the [place] FDA and  has been authorized for detection and/or diagnosis of SARS-CoV-2 by FDA under an Emergency Use Authorization (EUA). This EUA Brett remain  in effect (meaning this test can be used) for the duration of the COVID-19 declaration under Section 564(b)(1) of the Act, 21 U.S.C.section 360bbb-3(b)(1), unless the authorization is terminated  or revoked sooner.       Influenza A by PCR NEGATIVE NEGATIVE Final   Influenza B by PCR NEGATIVE NEGATIVE Final    Comment: (NOTE) The Xpert Xpress SARS-CoV-2/FLU/RSV plus assay is intended as an aid in the diagnosis of influenza from Nasopharyngeal swab specimens and should not be used as a sole basis for treatment. Nasal washings and aspirates are unacceptable for Xpert Xpress SARS-CoV-2/FLU/RSV testing.  Fact Sheet for Patients: EntrepreneurPulse.com.au  Fact Sheet for Healthcare Providers: IncredibleEmployment.be  This test is not yet approved or cleared by the [place] FDA and has been authorized for detection and/or diagnosis of SARS-CoV-2 by FDA under an Emergency Use Authorization (EUA). This EUA Brett remain in effect (meaning this test can be used) for the duration of the COVID-19 declaration under Section 564(b)(1) of the Act, 21 U.S.C. section 360bbb-3(b)(1), unless the authorization is terminated or revoked.  Performed at [facility], [address] [place], [place] [address]   Urine culture     Status: Abnormal    Collection Time: 09/29/20  5:51 PM   Specimen: In/Out Cath Urine  Result Value Ref Range Status   Specimen Description   Final    IN/OUT CATH URINE Performed at [facility], [address] [place],  [address]    Special Requests   Final    NONE Performed at [facility], [address], [place], [place] [address]    Culture 1,000 COLONIES/mL ENTEROCOCCUS FAECALIS (A)  Final   Report Status 10/03/2020 FINAL  Final   Organism ID, Bacteria ENTEROCOCCUS FAECALIS (A)  Final      Susceptibility   Enterococcus faecalis - MIC*    AMPICILLIN <=2 SENSITIVE Sensitive     NITROFURANTOIN <=16 SENSITIVE Sensitive     VANCOMYCIN 2 SENSITIVE Sensitive     * 1,000 COLONIES/mL ENTEROCOCCUS FAECALIS  Body fluid culture     Status: None   Collection Time: 09/30/20  9:38 AM   Specimen: Lung, Left; Pleural Fluid  Result Value Ref Range Status   Specimen Description PLEURAL FLUID  Final   Special Requests LEFT LUNG  Final   Gram Stain   Final    ABUNDANT WBC PRESENT,BOTH PMN AND MONONUCLEAR NO ORGANISMS SEEN    Culture   Final    NO GROWTH 3 DAYS Performed at [facility], [place] [address] [place], [place] [address]    Report Status 10/03/2020 FINAL  Final  Acid Fast Smear (AFB)     Status: None   Collection Time: 09/30/20  9:38 AM   Specimen: Lung, Left; Pleural Fluid  Result Value Ref Range Status   AFB Specimen Processing Concentration  Final   Acid Fast Smear Negative  Final    Comment: (NOTE) Performed At: [facility] [place], [place] [id number] Rush Farmer MD TK:[id number]    Source (AFB) PLEURAL  Final    Comment: FLUID LEFT LUNG Performed at [facility], [place] [address] [place],  [address]          Radiology Studies: CT ABDOMEN PELVIS WO CONTRAST  Result Date: 10/02/2020 CLINICAL DATA:  59 year old male with cancer of unknown primary. EXAM: CT CHEST, ABDOMEN AND PELVIS WITHOUT CONTRAST TECHNIQUE: Multidetector CT  imaging of the chest, abdomen and pelvis was performed following the standard protocol without IV contrast. COMPARISON:  Chest radiograph dated 10/02/2020. chest CT dated 09/29/2020. FINDINGS: Evaluation of this exam is limited in the absence of intravenous contrast. CT CHEST FINDINGS Cardiovascular: There is no cardiomegaly or pericardial effusion. The thoracic aorta and central pulmonary arteries are grossly unremarkable. Mediastinum/Nodes: Mildly enlarged right paratracheal lymph node measures 12 mm. The esophagus and the thyroid gland are grossly unremarkable. No mediastinal fluid collection. Lungs/Pleura: There is a 10.0 x 7.5 x 12.0 cm mass in the left upper lobe. There is diffuse interstitial coarsening and nodularity of the left lung with smaller scattered nodules concerning for metastatic spread. There is a small left pleural effusion, likely malignant effusion. There is consolidative changes of the majority of the left lung which may be combination of atelectasis, infiltrate, or metastasis. Several scattered nodules in the right lung measure up to 1 cm consistent with metastatic disease. There is a patchy area of nodular and ground-glass density in the right infrahilar region which may represent pneumonia, or aspiration. Metastasis is not excluded. There is background of moderate to severe centrilobular emphysema. No pneumothorax. There is compression and high-grade narrowing of the left lower lobe bronchus and occlusion of the left upper lobe bronchus by the left lung mass. There is apparent nodular thickening of the left pleural consistent with metastatic implant. Musculoskeletal: There is sclerotic lesion in the T6 as well as sclerotic changes of C7. Small sclerotic focus in T9. CT ABDOMEN PELVIS FINDINGS No intra-abdominal free air or free fluid. Hepatobiliary: Multiple hepatic hypodense lesions consistent with metastatic disease. These measure  up to 2.5 cm in the left lobe of the liver. There is  morphologic changes of cirrhosis or pseudo cirrhosis. No calcified gallstone or pericholecystic fluid. Pancreas: The pancreas is grossly unremarkable. Spleen: Heterogeneous appearance of the spleen. Adrenals/Urinary Tract: The adrenal glands are unremarkable. Multiple nonobstructing bilateral renal calculi measure up to 8 mm in the inferior pole of the right kidney. There is no hydronephrosis on either side. There is a 6 cm right renal cyst. The visualized ureters and urinary bladder appear unremarkable. Stomach/Bowel: There is circumferential thickening of the rectosigmoid involving approximately 9 cm segment (95/8) which may be inflammatory in etiology. There is however apparent shouldering of the soft tissue (sagittal 93/8) concerning for underlying malignancy. Clinical correlation and further evaluation with sigmoidoscopy or colonoscopy is recommended. There is moderate stool throughout the colon. There is colonic diverticulosis without active inflammatory changes. There is no bowel obstruction. The appendix is normal. Vascular/Lymphatic: The abdominal aorta and IVC unremarkable. No portal venous gas. No adenopathy. Reproductive: The prostate and seminal vesicles are grossly unremarkable. Other: Subcutaneous edema of the lateral pelvic wall. No fluid collection. Musculoskeletal: Faint irregular lucency involving the left superior pubic ramus concerning for metastatic disease. Several scattered small sclerotic lesions also noted involving the right ischium, sacral bone also concerning for metastatic disease. No acute fracture. IMPRESSION: 1. Large left upper lobe mass with findings of metastatic disease in the lungs, liver, and bones. 2. Circumferential thickening of the rectosigmoid with apparent shouldering concerning for underlying malignancy. Clinical correlation and further evaluation with sigmoidoscopy or colonoscopy is recommended. 3. Nonobstructing bilateral renal calculi. No hydronephrosis. 4. Colonic  diverticulosis. No bowel obstruction. Normal appendix. 5. Aortic Atherosclerosis (ICD10-I70.0) and Emphysema (ICD10-J43.9). Electronically Signed   By: Anner Crete M.D.   On: 10/02/2020 19:46   DG Chest 1 View  Result Date: 10/02/2020 CLINICAL DATA:  Status post thoracentesis EXAM: CHEST  1 VIEW COMPARISON:  October 01, 2020 FINDINGS: No pneumothorax. There remains extensive opacity throughout the left lung, largely due to airspace consolidation but with questionable residual pleural effusion. Ill-defined opacity right base is stable. No new opacity evident. Stable cardiac prominence. Pulmonary vascular on the right appears unremarkable. Pulmonary vascularity on the left is obscured. No bone lesions. IMPRESSION: No pneumothorax. Opacification of most of the left hemithorax remains, likely due to consolidation with questionable residual pleural effusion. Suspect patchy pneumonia right base, stable. No new opacity evident. Cardiac silhouette appears stable. Electronically Signed   By: Lowella Grip III M.D.   On: 10/02/2020 08:50   CT HEAD WO CONTRAST  Result Date: 10/02/2020 CLINICAL DATA:  Cancer of unknown primary EXAM: CT HEAD WITHOUT CONTRAST TECHNIQUE: Contiguous axial images were obtained from the base of the skull through the vertex without intravenous contrast. COMPARISON:  None. FINDINGS: Brain: Multiple acquisitions required due to patient motion artifact. Hypoattenuating foci are seen subcortical white matter of the right frontal lobe (8/8 and right insula (8/1) without significant surrounding vasogenic edema. Additional hyperattenuating focus seen near the gray-white interface of the high left frontal lobe (10/40). With some questionable surrounding vasogenic edema. There is a background of chronic microvascular angiopathy and parenchymal volume loss. No other acute intracranial abnormality is seen. No significant mass effect or midline shift is evident at this time. Vascular:  Atherosclerotic calcification of the carotid siphons and intradural vertebral arteries. No hyperdense vessel. Skull: No calvarial fracture or suspicious osseous lesion. No scalp swelling or hematoma. Sinuses/Orbits: Paranasal sinuses and mastoid air cells are predominantly clear. Included orbital structures are  unremarkable. Other: None. IMPRESSION: Severely motion degraded imaging despite multiple attempts at acquisition. Ill-defined hyperattenuating focus near the gray-white junction in the high left frontal lobe with some questionable surrounding hypoattenuation, possibly vasogenic edema raises concern for potential metastatic focus in the setting of cancer of unknown primary. Consider further evaluation with contrast enhanced MRI as patient is able to tolerate. May require sedation given extensive motion artifact on this CT imaging. Hypoattenuating foci in the right frontal lobe and insula, could reflect remote sequela of prior ischemia or infarct. Though could be better assessed on MR imaging as well. Electronically Signed   By: Lovena Le M.D.   On: 10/02/2020 19:37   CT CHEST WO CONTRAST  Result Date: 10/02/2020 CLINICAL DATA:  59 year old male with cancer of unknown primary. EXAM: CT CHEST, ABDOMEN AND PELVIS WITHOUT CONTRAST TECHNIQUE: Multidetector CT imaging of the chest, abdomen and pelvis was performed following the standard protocol without IV contrast. COMPARISON:  Chest radiograph dated 10/02/2020. chest CT dated 09/29/2020. FINDINGS: Evaluation of this exam is limited in the absence of intravenous contrast. CT CHEST FINDINGS Cardiovascular: There is no cardiomegaly or pericardial effusion. The thoracic aorta and central pulmonary arteries are grossly unremarkable. Mediastinum/Nodes: Mildly enlarged right paratracheal lymph node measures 12 mm. The esophagus and the thyroid gland are grossly unremarkable. No mediastinal fluid collection. Lungs/Pleura: There is a 10.0 x 7.5 x 12.0 cm mass in the  left upper lobe. There is diffuse interstitial coarsening and nodularity of the left lung with smaller scattered nodules concerning for metastatic spread. There is a small left pleural effusion, likely malignant effusion. There is consolidative changes of the majority of the left lung which may be combination of atelectasis, infiltrate, or metastasis. Several scattered nodules in the right lung measure up to 1 cm consistent with metastatic disease. There is a patchy area of nodular and ground-glass density in the right infrahilar region which may represent pneumonia, or aspiration. Metastasis is not excluded. There is background of moderate to severe centrilobular emphysema. No pneumothorax. There is compression and high-grade narrowing of the left lower lobe bronchus and occlusion of the left upper lobe bronchus by the left lung mass. There is apparent nodular thickening of the left pleural consistent with metastatic implant. Musculoskeletal: There is sclerotic lesion in the T6 as well as sclerotic changes of C7. Small sclerotic focus in T9. CT ABDOMEN PELVIS FINDINGS No intra-abdominal free air or free fluid. Hepatobiliary: Multiple hepatic hypodense lesions consistent with metastatic disease. These measure up to 2.5 cm in the left lobe of the liver. 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 No calcified gallstone or pericholecystic fluid. Pancreas: The pancreas is grossly unremarkable. Spleen: Heterogeneous appearance of the spleen. Adrenals/Urinary Tract: The adrenal glands are unremarkable. Multiple nonobstructing bilateral renal calculi measure up to 8 mm in the inferior pole of the right kidney. There is no hydronephrosis on either side. There is a 6 cm right renal cyst. The visualized ureters and urinary bladder appear unremarkable. Stomach/Bowel: There is circumferential thickening of the rectosigmoid involving approximately 9 cm segment (95/8) which may be inflammatory in etiology.  There is however apparent shouldering of the soft tissue (sagittal 93/8) concerning for underlying malignancy. Clinical correlation and further evaluation with sigmoidoscopy or colonoscopy is recommended. There is moderate stool throughout the colon. There is colonic diverticulosis without active inflammatory changes. There is no bowel obstruction. The appendix is normal. Vascular/Lymphatic: The abdominal aorta and IVC unremarkable. No portal venous gas. No adenopathy. Reproductive: The prostate and  seminal vesicles are grossly unremarkable. Other: Subcutaneous edema of the lateral pelvic wall. No fluid collection. Musculoskeletal: Faint irregular lucency involving the left superior pubic ramus concerning for metastatic disease. Several scattered small sclerotic lesions also noted involving the right ischium, sacral bone also concerning for metastatic disease. No acute fracture. IMPRESSION: 1. Large left upper lobe mass with findings of metastatic disease in the lungs, liver, and bones. 2. Circumferential thickening of the rectosigmoid with apparent shouldering concerning for underlying malignancy. Clinical correlation and further evaluation with sigmoidoscopy or colonoscopy is recommended. 3. Nonobstructing bilateral renal calculi. No hydronephrosis. 4. Colonic diverticulosis. No bowel obstruction. Normal appendix. 5. Aortic Atherosclerosis (ICD10-I70.0) and Emphysema (ICD10-J43.9). Electronically Signed   By: Anner Crete M.D.   On: 10/02/2020 19:46   IR THORACENTESIS ASP PLEURAL SPACE W/IMG GUIDE  Result Date: 10/02/2020 INDICATION: Patient with suspected lung cancer and recurrent left pleural effusions. Interventional radiology asked to perform a therapeutic thoracentesis. EXAM: ULTRASOUND GUIDED THORACENTESIS MEDICATIONS: 1% lidocaine 10 mL COMPLICATIONS: None immediate. PROCEDURE: An ultrasound guided thoracentesis was thoroughly discussed with the patient and questions answered. The benefits, risks,  alternatives and complications were also discussed. The patient understands and wishes to proceed with the procedure. Written consent was obtained. Ultrasound was performed to localize and mark an adequate pocket of fluid in the left chest. The area was then prepped and draped in the normal sterile fashion. 1% Lidocaine was used for local anesthesia. Under ultrasound guidance a 6 Fr Safe-T-Centesis catheter was introduced. Thoracentesis was performed. The catheter was removed and a dressing applied. FINDINGS: A total of approximately 600 mL of maroon-colored fluid was removed. IMPRESSION: Successful ultrasound guided left thoracentesis yielding 600 mL of pleural fluid. Read by: Soyla Dryer, NP Electronically Signed   By: Markus Daft M.D.   On: 10/02/2020 09:28        Scheduled Meds: . [START ON 10/04/2020] aspirin  81 mg Oral Pre-Cath  . aspirin EC  81 mg Oral Daily  . atorvastatin  20 mg Oral Daily  . levalbuterol  1.25 mg Nebulization Q6H  . metoprolol tartrate  25 mg Oral BID  . predniSONE  50 mg Oral Q6H  . sodium chloride flush  3 mL Intravenous Q12H   Continuous Infusions: . sodium chloride    . [START ON 10/04/2020] sodium chloride    . heparin 2,100 Units/hr (10/03/20 0926)     LOS: 4 days    Time spent:40 min    Brett Medina, Brett Docker, MD [facility] Pager [phone]  If 7PM-7AM, please contact night-coverage [email or web address] Password [facility] 10/03/2020, 8:45 PM

## 2020-10-03 NOTE — Consult Note (Signed)
NAME:  Brett Medina, MRN:  [id number], DOB:  Mar 08, 1961, LOS: 4 ADMISSION DATE:  09/29/2020, CONSULTATION DATE:  10/01/2020 REFERRING MD:  Dr. Nevada Crane , CHIEF COMPLAINT:  Pleural Effusion  Brief History   59 year old male with hx of tobacco abuse presenting with progressive shortness of breath with CT findings concerning for metastatic lung process with large left pleural effusion.    History of present illness    59 year old male with no significant past medical history other than tobacco abuse with a 68 year pack history (reports smoking for 45 years, 1.5 ppd) in which he quit smoking 2 months ago.  He does not see a physician on a regular basis.  Reports chronic bilateral lower leg swelling, as it runs in his family.    Presents with progressive two month history of progressive dyspnea.  Started out in September as dyspnea on exertion which progressed to dsypnea even at rest.  Reports chronic unchanged productive cough- with clear sputum.  Also reports 15lb weight loss over the last month.  He was seen at urgent care several weeks ago and prescribed prednisone, which did not help.  On admit, he was tachycardic, mildly tachypneic, and room air saturations of 91-97%.  Workup notable for WBC 13.2, troponin 1087->1380-> 1446-> 746, BNP 205, SARS2 negative, moderate Hgb on UA.  CXR showed near complete consolidation of left hemithorax with combination of pleural effusion and consolidation of left lung, with peripheral nodular opacity of right mid lung, all concerning for malignancy.  He was cultured and started on empiric antibiotics.  He was admitted to [facility] for further workup.   Cardiology consulted and are following given elevated troponin's and started on heparin gtt.  TTE showing LVEF 40-45% with global hypokinesis.  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  He underwent a left thoracentesis in IR on 11/29 with 950 ml fluid removed and sent for cytology and pleural studies.  Reports some improvement in SOB since thoracentesis.   Repeat CXR today, shows increased size in left pleural effusion.  Patient reports no worsening of SOB today.  Pulmonary consulted for further management and evaluation of pleural effusion and pulmonary nodules.   Past Medical History  Tobacco abuse   Significant Hospital Events   11/28 Admitted [place] 11/29 left thoracentesis/ cards consult  Consults:  IR Cardiology  Pulmonary  Procedures:  11/29 Left thoracentesis IR-> 950 ml of dark brown fluid  Significant Diagnostic Tests:  11/28 CT chest wo contrast >> 1. Constellation of findings consistent with metastatic malignancy. 2. Moderate to large volume left pleural effusion with almost complete collapse of the left upper and lower lobes. Associated mediastinal lymphadenopathy and limited evaluation for hilar lymphadenopathy on this noncontrast study. Underlying malignancy suspected (ddx includes metastasis or primary lung). Consider analysis of the pleural fluid for further evaluation. 3. Indeterminate multifocal right lung pulmonary nodules measuring up to 1 cm in the right middle lobe. 4. Indeterminate 3.2 cm peribronchovascular ground-glass airspace opacity within the right lower lobe. 5. Scattered suspicious sclerotic lesions concerning for osseous metastases. 6. Indeterminate pericentimeter subcutaneus soft tissue densities within the bilateral anterolateral back at the level of scapulas. 7. Severe emphysematous changes. 8. Ascending aorta aneurysm measuring up  to 4.5 cm. 9. Aortic Atherosclerosis (ICD10-I70.0) and Emphysema  TTE  11/29 >> 1. Left ventricular ejection fraction, by estimation, is 40 to 45%. The  left ventricle has mildly decreased function. The left ventricle  demonstrates global hypokinesis. Left ventricular diastolic parameters are  indeterminate.  2. Right ventricular systolic function is normal. The right ventricular  size is normal. Tricuspid regurgitation signal is inadequate for assessing  PA pressure.  3. The mitral valve is normal in structure. No evidence of mitral valve  regurgitation. No evidence of mitral stenosis.  4. The aortic valve was not well visualized. Aortic valve regurgitation  is trivial. No aortic stenosis is present.  5. Aortic dilatation noted. 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  6. The inferior vena cava is dilated in size with <50% respiratory  variability, suggesting right atrial pressure of 15 mmHg.   Micro Data:  11/28 SARS 2/ flu >>neg 11/28 BCx2 >> ngtd 11/28 UC >> 11/29 left pleural studies: GS >>          AFB >>                      Culture >>                                Cytology >>           Antimicrobials:  11/28 azithro >> 11/28 ceftriaxone >>  Interim history/subjective:  Reports dyspnea with exertion No chest pain  Objective   Blood pressure (!) 132/96, pulse (!) 117, temperature 97.8 F (36.6 C), temperature source Oral, resp. rate 20, height 5\' 9"  (1.753 m), weight 89.9 kg, SpO2 95 %.        Intake/Output Summary (Last 24 hours) at 10/03/2020 1719 Last data filed at 10/03/2020 0810 Gross per 24 hour  Intake --  Output 325 ml  Net -325 ml   Filed Weights   09/29/20 2220 10/01/20 0441 10/03/20 0434  Weight: 97.2 kg 91.2 kg 89.9 kg   Physical Exam: General: Chronically ill-appearing, no acute distress HENT: [place], AT, OP clear, MMM Eyes: EOMI, no scleral icterus Respiratory: Diminished left breath sounds. Right side clear with no crackles, wheezing or rales Cardiovascular: RRR, -M/R/G, no JVD GI:  BS+, soft, nontender Extremities:-Edema,-tenderness  Resolved Hospital Problem list    Assessment & Plan:   Acute hypoxic respiratory failure secondary to lung mass with atelectasis, pleural effusion Emphysematous changes on CT - Lower extremity doppler negative.  RV reassuring on TTE, doubt acute PE; given contrast dye allergy, CTA not performed - continue supplemental O2 for sat goal > 88% - unable to determine pleural fluid by Lights criteria, but primarily exudative by pleural studies, LDH 4038, protein 3.4 - Cytology still pending - If cytology negative, will need to obtain tissue sample via bronchoscopy  Systolic HF Elevated troponin Tachycardia - per Cardiology- ASA, statin, BB; planning for cardiac cath tomorrow - ongoing heparin gtt for now  - TTE w/ LVEF 40-45% with global hypokinesis  Pulmonary team will peripherally follow over the weekend and will coordinate procedure pending cardiac cath results  Best practice   Diet: heart healthy Pain/Anxiety/Delirium protocol (if indicated): n/a VAP protocol (if indicated): n/a DVT prophylaxis: heparin gtt GI prophylaxis: n/a Glucose control: n/a Mobility: as tolerated Code Status: full  Disposition: telemetry  Labs   CBC: Recent Labs  Lab 09/29/20 1635 09/30/20 0213 10/01/20 0154  10/02/20 0135 10/03/20 0323  WBC 13.2* 10.8* 10.7* 11.5* 10.7*  NEUTROABS 10.7*  --   --   --  8.6*  HGB 14.1 13.2 13.0 12.4* 12.5*  HCT 44.2 41.6 42.2 39.9 37.2*  MCV 78.6* 78.3* 79.5* 79.8* 77.3*  PLT 306 278 266 276 124    Basic Metabolic Panel: Recent Labs  Lab 09/29/20 1635 09/30/20 0213 10/01/20 0130 10/02/20 0135 10/03/20 0323  NA 132* 136 133* 135 135  K 3.7 4.4 4.3 4.5 3.8  CL 100 100 99 98 99  CO2 22 26 28 26 23   GLUCOSE 113* 110* 94 102* 102*  BUN 13 12 13 14 10   CREATININE 1.14 1.35* 1.36* 1.32* 1.16  CALCIUM 9.0 9.0 8.7* 9.0 8.8*  MG  --   --   --  1.8 1.6*  PHOS  --   --   --   --  3.7   GFR: Estimated  Creatinine Clearance: 77 mL/min (by C-G formula based on SCr of 1.16 mg/dL). Recent Labs  Lab 09/29/20 1635 09/29/20 1635 09/30/20 0213 10/01/20 0154 10/02/20 0135 10/03/20 0323  PROCALCITON  --   --   --   --  0.25  --   WBC 13.2*   < > 10.8* 10.7* 11.5* 10.7*  LATICACIDVEN 1.3  --   --   --   --   --    < > = values in this interval not displayed.    Liver Function Tests: Recent Labs  Lab 09/29/20 1635 10/03/20 0323  AST 33 30  ALT 29 26  ALKPHOS 90 88  BILITOT 0.6 0.6  PROT 6.3* 5.2*  ALBUMIN 2.7* 2.1*   No results for input(s): LIPASE, AMYLASE in the last 168 hours. No results for input(s): AMMONIA in the last 168 hours.  ABG No results found for: PHART, PCO2ART, PO2ART, HCO3, TCO2, ACIDBASEDEF, O2SAT   Coagulation Profile: Recent Labs  Lab 09/29/20 1635  INR 1.2    Cardiac Enzymes: No results for input(s): CKTOTAL, CKMB, CKMBINDEX, TROPONINI in the last 168 hours.  HbA1C: Hgb A1c MFr Bld  Date/Time Value Ref Range Status  09/30/2020 02:13 AM 5.7 (H) 4.8 - 5.6 % Final    Comment:    (NOTE) Pre diabetes:          5.7%-6.4%  Diabetes:              >6.4%  Glycemic control for   <7.0% adults with diabetes     CBG: Recent Labs  Lab 10/01/20 0118 10/01/20 0638  GLUCAP 87 98   Care Time: 35 min Coordinated care with Primary team and Cardiology. Discussed above plan with patient and daughter and answered questions.  Rodman Pickle, M.D. [facility] Pulmonary/Critical Care Medicine 10/03/2020 5:19 PM

## 2020-10-03 NOTE — Progress Notes (Addendum)
Progress Note  Patient Name: Brett Medina Date of Encounter: 10/03/2020  Primary Cardiologist: Elouise Munroe, MD   Subjective   Breathing appears stable, continues to have coughing.  No chest pain.  Discussed his care with PCCM as well as internal medicine, Dr. Loanne Drilling and Dr. Sherral Hammers.  Inpatient Medications    Scheduled Meds: . aspirin EC  81 mg Oral Daily  . atorvastatin  20 mg Oral Daily  . levalbuterol  1.25 mg Nebulization Q6H  . metoprolol tartrate  25 mg Oral BID   Continuous Infusions: . heparin 2,100 Units/hr (10/03/20 0926)   PRN Meds: acetaminophen **OR** acetaminophen, guaiFENesin-codeine, labetalol, lidocaine (PF), lidocaine, loperamide, ondansetron **OR** ondansetron (ZOFRAN) IV, polyethylene glycol   Vital Signs    Vitals:   10/03/20 0434 10/03/20 0811 10/03/20 0827 10/03/20 1106  BP: (!) 129/99 (!) 153/102  (!) 122/107  Pulse: (!) 120  (!) 127 (!) 103  Resp: 20  20 19   Temp: 99.1 F (37.3 C) 99.2 F (37.3 C)  99.2 F (37.3 C)  TempSrc: Oral Axillary  Oral  SpO2: 96%  97% 97%  Weight: 89.9 kg     Height:        Intake/Output Summary (Last 24 hours) at 10/03/2020 1125 Last data filed at 10/03/2020 0810 Gross per 24 hour  Intake --  Output 325 ml  Net -325 ml   Filed Weights   09/29/20 2220 10/01/20 0441 10/03/20 0434  Weight: 97.2 kg 91.2 kg 89.9 kg    Telemetry    Sinus rhythm, 4 beat run of nonsustained VT- Personally Reviewed  ECG    No new- Personally Reviewed  Physical Exam   GEN: No acute distress.   Neck: No JVD Cardiac: regular rhythm, tachycardic no murmurs, rubs, or gallops.  Respiratory: Diminished on the left GI: Soft, nontender, non-distended  MS: No edema; No deformity. Neuro:  Nonfocal  Psych: Normal affect   Labs    Chemistry Recent Labs  Lab 09/29/20 1635 09/30/20 0213 10/01/20 0130 10/02/20 0135 10/03/20 0323  NA 132*   < > 133* 135 135  K 3.7   < > 4.3 4.5 3.8  CL 100   < > 99 98 99  CO2 22    < > 28 26 23   GLUCOSE 113*   < > 94 102* 102*  BUN 13   < > 13 14 10   CREATININE 1.14   < > 1.36* 1.32* 1.16  CALCIUM 9.0   < > 8.7* 9.0 8.8*  PROT 6.3*  --   --   --  5.2*  ALBUMIN 2.7*  --   --   --  2.1*  AST 33  --   --   --  30  ALT 29  --   --   --  26  ALKPHOS 90  --   --   --  88  BILITOT 0.6  --   --   --  0.6  GFRNONAA >60   < > >60 >60 >60  ANIONGAP 10   < > 6 11 13    < > = values in this interval not displayed.     Hematology Recent Labs  Lab 10/01/20 0154 10/02/20 0135 10/03/20 0323  WBC 10.7* 11.5* 10.7*  RBC 5.31 5.00 4.81  HGB 13.0 12.4* 12.5*  HCT 42.2 39.9 37.2*  MCV 79.5* 79.8* 77.3*  MCH 24.5* 24.8* 26.0  MCHC 30.8 31.1 33.6  RDW 17.2* 17.0* 16.9*  PLT 266 276 267  Cardiac EnzymesNo results for input(s): TROPONINI in the last 168 hours. No results for input(s): TROPIPOC in the last 168 hours.   BNP Recent Labs  Lab 09/29/20 1635  BNP 205.0*     DDimer No results for input(s): DDIMER in the last 168 hours.   Radiology    CT ABDOMEN PELVIS WO CONTRAST  Result Date: 10/02/2020 CLINICAL DATA:  59 year old male with cancer of unknown primary. EXAM: CT CHEST, ABDOMEN AND PELVIS WITHOUT CONTRAST TECHNIQUE: Multidetector CT imaging of the chest, abdomen and pelvis was performed following the standard protocol without IV contrast. COMPARISON:  Chest radiograph dated 10/02/2020. chest CT dated 09/29/2020. FINDINGS: Evaluation of this exam is limited in the absence of intravenous contrast. CT CHEST FINDINGS Cardiovascular: There is no cardiomegaly or pericardial effusion. The thoracic aorta and central pulmonary arteries are grossly unremarkable. Mediastinum/Nodes: Mildly enlarged right paratracheal lymph node measures 12 mm. The esophagus and the thyroid gland are grossly unremarkable. No mediastinal fluid collection. Lungs/Pleura: There is a 10.0 x 7.5 x 12.0 cm mass in the left upper lobe. There is diffuse interstitial coarsening and nodularity of the  left lung with smaller scattered nodules concerning for metastatic spread. There is a small left pleural effusion, likely malignant effusion. There is consolidative changes of the majority of the left lung which may be combination of atelectasis, infiltrate, or metastasis. Several scattered nodules in the right lung measure up to 1 cm consistent with metastatic disease. There is a patchy area of nodular and ground-glass density in the right infrahilar region which may represent pneumonia, or aspiration. Metastasis is not excluded. There is background of moderate to severe centrilobular emphysema. No pneumothorax. There is compression and high-grade narrowing of the left lower lobe bronchus and occlusion of the left upper lobe bronchus by the left lung mass. There is apparent nodular thickening of the left pleural consistent with metastatic implant. Musculoskeletal: There is sclerotic lesion in the T6 as well as sclerotic changes of C7. Small sclerotic focus in T9. CT ABDOMEN PELVIS FINDINGS No intra-abdominal free air or free fluid. Hepatobiliary: Multiple hepatic hypodense lesions consistent with metastatic disease. These measure up to 2.5 cm in the left lobe of the liver. 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 No calcified gallstone or pericholecystic fluid. Pancreas: The pancreas is grossly unremarkable. Spleen: Heterogeneous appearance of the spleen. Adrenals/Urinary Tract: The adrenal glands are unremarkable. Multiple nonobstructing bilateral renal calculi measure up to 8 mm in the inferior pole of the right kidney. There is no hydronephrosis on either side. There is a 6 cm right renal cyst. The visualized ureters and urinary bladder appear unremarkable. Stomach/Bowel: There is circumferential thickening of the rectosigmoid involving approximately 9 cm segment (95/8) which may be inflammatory in etiology. There is however apparent shouldering of the soft tissue (sagittal 93/8) concerning  for underlying malignancy. Clinical correlation and further evaluation with sigmoidoscopy or colonoscopy is recommended. There is moderate stool throughout the colon. There is colonic diverticulosis without active inflammatory changes. There is no bowel obstruction. The appendix is normal. Vascular/Lymphatic: The abdominal aorta and IVC unremarkable. No portal venous gas. No adenopathy. Reproductive: The prostate and seminal vesicles are grossly unremarkable. Other: Subcutaneous edema of the lateral pelvic wall. No fluid collection. Musculoskeletal: Faint irregular lucency involving the left superior pubic ramus concerning for metastatic disease. Several scattered small sclerotic lesions also noted involving the right ischium, sacral bone also concerning for metastatic disease. No acute fracture. IMPRESSION: 1. Large left upper lobe mass with findings of  metastatic disease in the lungs, liver, and bones. 2. Circumferential thickening of the rectosigmoid with apparent shouldering concerning for underlying malignancy. Clinical correlation and further evaluation with sigmoidoscopy or colonoscopy is recommended. 3. Nonobstructing bilateral renal calculi. No hydronephrosis. 4. Colonic diverticulosis. No bowel obstruction. Normal appendix. 5. Aortic Atherosclerosis (ICD10-I70.0) and Emphysema (ICD10-J43.9). Electronically Signed   By: Anner Crete M.D.   On: 10/02/2020 19:46   DG Chest 1 View  Result Date: 10/02/2020 CLINICAL DATA:  Status post thoracentesis EXAM: CHEST  1 VIEW COMPARISON:  October 01, 2020 FINDINGS: No pneumothorax. There remains extensive opacity throughout the left lung, largely due to airspace consolidation but with questionable residual pleural effusion. Ill-defined opacity right base is stable. No new opacity evident. Stable cardiac prominence. Pulmonary vascular on the right appears unremarkable. Pulmonary vascularity on the left is obscured. No bone lesions. IMPRESSION: No pneumothorax.  Opacification of most of the left hemithorax remains, likely due to consolidation with questionable residual pleural effusion. Suspect patchy pneumonia right base, stable. No new opacity evident. Cardiac silhouette appears stable. Electronically Signed   By: Lowella Grip III M.D.   On: 10/02/2020 08:50   CT HEAD WO CONTRAST  Result Date: 10/02/2020 CLINICAL DATA:  Cancer of unknown primary EXAM: CT HEAD WITHOUT CONTRAST TECHNIQUE: Contiguous axial images were obtained from the base of the skull through the vertex without intravenous contrast. COMPARISON:  None. FINDINGS: Brain: Multiple acquisitions required due to patient motion artifact. Hypoattenuating foci are seen subcortical white matter of the right frontal lobe (8/8 and right insula (8/1) without significant surrounding vasogenic edema. Additional hyperattenuating focus seen near the gray-white interface of the high left frontal lobe (10/40). With some questionable surrounding vasogenic edema. There is a background of chronic microvascular angiopathy and parenchymal volume loss. No other acute intracranial abnormality is seen. No significant mass effect or midline shift is evident at this time. Vascular: Atherosclerotic calcification of the carotid siphons and intradural vertebral arteries. No hyperdense vessel. Skull: No calvarial fracture or suspicious osseous lesion. No scalp swelling or hematoma. Sinuses/Orbits: Paranasal sinuses and mastoid air cells are predominantly clear. Included orbital structures are unremarkable. Other: None. IMPRESSION: Severely motion degraded imaging despite multiple attempts at acquisition. Ill-defined hyperattenuating focus near the gray-white junction in the high left frontal lobe with some questionable surrounding hypoattenuation, possibly vasogenic edema raises concern for potential metastatic focus in the setting of cancer of unknown primary. Consider further evaluation with contrast enhanced MRI as patient is  able to tolerate. May require sedation given extensive motion artifact on this CT imaging. Hypoattenuating foci in the right frontal lobe and insula, could reflect remote sequela of prior ischemia or infarct. Though could be better assessed on MR imaging as well. Electronically Signed   By: Lovena Le M.D.   On: 10/02/2020 19:37   CT CHEST WO CONTRAST  Result Date: 10/02/2020 CLINICAL DATA:  59 year old male with cancer of unknown primary. EXAM: CT CHEST, ABDOMEN AND PELVIS WITHOUT CONTRAST TECHNIQUE: Multidetector CT imaging of the chest, abdomen and pelvis was performed following the standard protocol without IV contrast. COMPARISON:  Chest radiograph dated 10/02/2020. chest CT dated 09/29/2020. FINDINGS: Evaluation of this exam is limited in the absence of intravenous contrast. CT CHEST FINDINGS Cardiovascular: There is no cardiomegaly or pericardial effusion. The thoracic aorta and central pulmonary arteries are grossly unremarkable. Mediastinum/Nodes: Mildly enlarged right paratracheal lymph node measures 12 mm. The esophagus and the thyroid gland are grossly unremarkable. No mediastinal fluid collection. Lungs/Pleura: There is a 10.0 x 7.5  x 12.0 cm mass in the left upper lobe. There is diffuse interstitial coarsening and nodularity of the left lung with smaller scattered nodules concerning for metastatic spread. There is a small left pleural effusion, likely malignant effusion. There is consolidative changes of the majority of the left lung which may be combination of atelectasis, infiltrate, or metastasis. Several scattered nodules in the right lung measure up to 1 cm consistent with metastatic disease. There is a patchy area of nodular and ground-glass density in the right infrahilar region which may represent pneumonia, or aspiration. Metastasis is not excluded. There is background of moderate to severe centrilobular emphysema. No pneumothorax. There is compression and high-grade narrowing of the  left lower lobe bronchus and occlusion of the left upper lobe bronchus by the left lung mass. There is apparent nodular thickening of the left pleural consistent with metastatic implant. Musculoskeletal: There is sclerotic lesion in the T6 as well as sclerotic changes of C7. Small sclerotic focus in T9. CT ABDOMEN PELVIS FINDINGS No intra-abdominal free air or free fluid. Hepatobiliary: Multiple hepatic hypodense lesions consistent with metastatic disease. These measure up to 2.5 cm in the left lobe of the liver. 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 No calcified gallstone or pericholecystic fluid. Pancreas: The pancreas is grossly unremarkable. Spleen: Heterogeneous appearance of the spleen. Adrenals/Urinary Tract: The adrenal glands are unremarkable. Multiple nonobstructing bilateral renal calculi measure up to 8 mm in the inferior pole of the right kidney. There is no hydronephrosis on either side. There is a 6 cm right renal cyst. The visualized ureters and urinary bladder appear unremarkable. Stomach/Bowel: There is circumferential thickening of the rectosigmoid involving approximately 9 cm segment (95/8) which may be inflammatory in etiology. There is however apparent shouldering of the soft tissue (sagittal 93/8) concerning for underlying malignancy. Clinical correlation and further evaluation with sigmoidoscopy or colonoscopy is recommended. There is moderate stool throughout the colon. There is colonic diverticulosis without active inflammatory changes. There is no bowel obstruction. The appendix is normal. Vascular/Lymphatic: The abdominal aorta and IVC unremarkable. No portal venous gas. No adenopathy. Reproductive: The prostate and seminal vesicles are grossly unremarkable. Other: Subcutaneous edema of the lateral pelvic wall. No fluid collection. Musculoskeletal: Faint irregular lucency involving the left superior pubic ramus concerning for metastatic disease. Several scattered  small sclerotic lesions also noted involving the right ischium, sacral bone also concerning for metastatic disease. No acute fracture. IMPRESSION: 1. Large left upper lobe mass with findings of metastatic disease in the lungs, liver, and bones. 2. Circumferential thickening of the rectosigmoid with apparent shouldering concerning for underlying malignancy. Clinical correlation and further evaluation with sigmoidoscopy or colonoscopy is recommended. 3. Nonobstructing bilateral renal calculi. No hydronephrosis. 4. Colonic diverticulosis. No bowel obstruction. Normal appendix. 5. Aortic Atherosclerosis (ICD10-I70.0) and Emphysema (ICD10-J43.9). Electronically Signed   By: Anner Crete M.D.   On: 10/02/2020 19:46   IR THORACENTESIS ASP PLEURAL SPACE W/IMG GUIDE  Result Date: 10/02/2020 INDICATION: Patient with suspected lung cancer and recurrent left pleural effusions. Interventional radiology asked to perform a therapeutic thoracentesis. EXAM: ULTRASOUND GUIDED THORACENTESIS MEDICATIONS: 1% lidocaine 10 mL COMPLICATIONS: None immediate. PROCEDURE: An ultrasound guided thoracentesis was thoroughly discussed with the patient and questions answered. The benefits, risks, alternatives and complications were also discussed. The patient understands and wishes to proceed with the procedure. Written consent was obtained. Ultrasound was performed to localize and mark an adequate pocket of fluid in the left chest. The area was then prepped and draped in the  normal sterile fashion. 1% Lidocaine was used for local anesthesia. Under ultrasound guidance a 6 Fr Safe-T-Centesis catheter was introduced. Thoracentesis was performed. The catheter was removed and a dressing applied. FINDINGS: A total of approximately 600 mL of maroon-colored fluid was removed. IMPRESSION: Successful ultrasound guided left thoracentesis yielding 600 mL of pleural fluid. Read by: Soyla Dryer, NP Electronically Signed   By: Markus Daft M.D.   On:  10/02/2020 09:28    Cardiac Studies    Echo 09/30/2020 1. Left ventricular ejection fraction, by estimation, is 40 to 45%. The  left ventricle has mildly decreased function. The left ventricle  demonstrates global hypokinesis. Left ventricular diastolic parameters are  indeterminate.  2. Right ventricular systolic function is normal. The right ventricular  size is normal. Tricuspid regurgitation signal is inadequate for assessing  PA pressure.  3. The mitral valve is normal in structure. No evidence of mitral valve  regurgitation. No evidence of mitral stenosis.  4. The aortic valve was not well visualized. Aortic valve regurgitation  is trivial. No aortic stenosis is present.  5. Aortic dilatation noted. 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  6. The inferior vena cava is dilated in size with <50% respiratory  variability, suggesting right atrial pressure of 15 mmHg.   Patient Profile     59 y.o. male with no documented past medical history but evidence of hypertension, and tobacco smoking and no regular primary care follow-up who presented to emergency room  with 48-months history of progressive worsening dyspnea on exertion, orthopnea, PND and lower extremity edema. [place] to have acute hypoxic respiratory failure and large L pleural effusion and elevated tropoins with reduced EF.   Assessment & Plan   Principal Problem:   Large pleural effusion Active Problems:   Elevated troponin   Challenging situation.  We are still waiting on cytology.  He has a reduced ejection fraction with a peak troponin around 1400 and it is downtrending.  With his significant smoking history, and evidence of elevated blood pressures in hospital suggestive of hypertension, he may likely have coronary artery disease.  He initially refused iodinated contrast, but we have discussed this multiple times now and he is agreeable to receiving this.  He had flushing with  contrast many years ago and we discussed that newer contrast agents are less likely to cause this.  We will premedicate nevertheless.  We have discussed in great detail CT coronary angiography, inpatient nuclear stress testing, and coronary angiography.  With the degree of his troponin elevation and reduced EF, I feel coronary angiography is the most appropriate test for ischemic evaluation.  However, primary issues have been with large left pleural effusion and both significant coughing and shortness of breath.  For this reason we chose to electively pursue an ischemic evaluation, and then initially planned this for next week.  However after discussing with Dr. Sherral Hammers and Dr. Loanne Drilling today, if any further pulmonary procedures are needed, they are requesting further cardiovascular risk stratification.  It may be difficult to provide any additional information about his cardiovascular status without performing an ischemic evaluation.  Therefore we have discussed the risks and benefits of a DIAGNOSTIC coronary angiogram for coronary anatomy, and we will tentatively plan this for tomorrow.  I will discuss his care with the interventional cardiologist prior to procedure.  I remain concerned that he may not be able to lay flat or may have significant intractable coughing, which would make his coronary angiogram more  difficult to safely perform.  We will reevaluate how he is feeling tomorrow.  Continue metoprolol, aspirin, atorvastatin.  INFORMED CONSENT: I have reviewed the risks, indications, and alternatives to cardiac catheterization, possible angioplasty, and stenting with the patient. Risks include but are not limited to bleeding, infection, vascular injury, stroke, myocardial infection, arrhythmia, kidney injury, radiation-related injury in the case of prolonged fluoroscopy use, emergency cardiac surgery, and death. The patient understands the risks of serious complication is 1-2 in 4492 with diagnostic  cardiac cath and 1-2% or less with angioplasty/stenting.       For questions or updates, please contact [facility] Please consult [email or web address] for contact info under        Signed, Elouise Munroe, MD  10/03/2020, 11:25 AM

## 2020-10-04 ENCOUNTER — Encounter (HOSPITAL_COMMUNITY)
Admission: EM | Disposition: A | Discharge status: Hospice | Payer: Self-pay | Source: Home / Self Care | Attending: Internal Medicine

## 2020-10-04 DIAGNOSIS — I248 Other forms of acute ischemic heart disease: Secondary | ICD-10-CM

## 2020-10-04 DIAGNOSIS — I2510 Atherosclerotic heart disease of native coronary artery without angina pectoris: Secondary | ICD-10-CM

## 2020-10-04 HISTORY — PX: LEFT HEART CATH AND CORONARY ANGIOGRAPHY: CATH118249

## 2020-10-04 LAB — COMPREHENSIVE METABOLIC PANEL
ALT: 27 U/L (ref 0–44)
AST: 40 U/L (ref 15–41)
Albumin: 2.1 g/dL — ABNORMAL LOW (ref 3.5–5.0)
Alkaline Phosphatase: 106 U/L (ref 38–126)
Anion gap: 9 (ref 5–15)
BUN: 14 mg/dL (ref 6–20)
CO2: 22 mmol/L (ref 22–32)
Calcium: 8.9 mg/dL (ref 8.9–10.3)
Chloride: 102 mmol/L (ref 98–111)
Creatinine, Ser: 1.21 mg/dL (ref 0.61–1.24)
GFR, Estimated: >60 mL/min (ref >60)
Glucose, Bld: 144 mg/dL — ABNORMAL HIGH (ref 70–99)
Potassium: 4.6 mmol/L (ref 3.5–5.1)
Sodium: 133 mmol/L — ABNORMAL LOW (ref 135–145)
Total Bilirubin: 0.5 mg/dL (ref 0.3–1.2)
Total Protein: 5.6 g/dL — ABNORMAL LOW (ref 6.5–8.1)

## 2020-10-04 LAB — CULTURE, BLOOD (ROUTINE X 2)
Culture: NO GROWTH
Culture: NO GROWTH
Special Requests: ADEQUATE

## 2020-10-04 LAB — CBC WITH DIFFERENTIAL/PLATELET
Abs Immature Granulocytes: 0.09 10*3/uL — ABNORMAL HIGH (ref 0.00–0.07)
Basophils Absolute: 0 10*3/uL (ref 0.0–0.1)
Basophils Relative: 0 %
Eosinophils Absolute: 0 10*3/uL (ref 0.0–0.5)
Eosinophils Relative: 0 %
HCT: 39.5 % (ref 39.0–52.0)
Hemoglobin: 12.3 g/dL — ABNORMAL LOW (ref 13.0–17.0)
Immature Granulocytes: 1 %
Lymphocytes Relative: 3 %
Lymphs Abs: 0.3 10*3/uL — ABNORMAL LOW (ref 0.7–4.0)
MCH: 24.6 pg — ABNORMAL LOW (ref 26.0–34.0)
MCHC: 31.1 g/dL (ref 30.0–36.0)
MCV: 79 fL — ABNORMAL LOW (ref 80.0–100.0)
Monocytes Absolute: 0.3 10*3/uL (ref 0.1–1.0)
Monocytes Relative: 2 %
Neutro Abs: 10.2 10*3/uL — ABNORMAL HIGH (ref 1.7–7.7)
Neutrophils Relative %: 94 %
Platelets: 256 10*3/uL (ref 150–400)
RBC: 5 MIL/uL (ref 4.22–5.81)
RDW: 17 % — ABNORMAL HIGH (ref 11.5–15.5)
WBC: 10.8 10*3/uL — ABNORMAL HIGH (ref 4.0–10.5)
nRBC: 0 % (ref 0.0–0.2)

## 2020-10-04 LAB — HEPARIN LEVEL (UNFRACTIONATED): Heparin Unfractionated: 0.37 IU/mL (ref 0.30–0.70)

## 2020-10-04 LAB — MAGNESIUM: Magnesium: 2 mg/dL (ref 1.7–2.4)

## 2020-10-04 LAB — PHOSPHORUS: Phosphorus: 4.3 mg/dL (ref 2.5–4.6)

## 2020-10-04 LAB — CHOLESTEROL, BODY FLUID: Cholesterol, Fluid: 95 mg/dL

## 2020-10-04 SURGERY — BRONCHOSCOPY, WITH EBUS
Anesthesia: General

## 2020-10-04 SURGERY — LEFT HEART CATH AND CORONARY ANGIOGRAPHY
Anesthesia: LOCAL

## 2020-10-04 MED ORDER — MIDAZOLAM HCL 2 MG/2ML IJ SOLN
INTRAMUSCULAR | Status: AC
  Filled 2020-10-04: qty 2

## 2020-10-04 MED ORDER — SODIUM CHLORIDE 0.9 % WEIGHT BASED INFUSION
1.0000 mL/kg/h | INTRAVENOUS | Status: AC
  Administered 2020-10-04: 1 mL/kg/h via INTRAVENOUS

## 2020-10-04 MED ORDER — HEPARIN (PORCINE) IN NACL 1000-0.9 UT/500ML-% IV SOLN
INTRAVENOUS | Status: DC | PRN
  Administered 2020-10-04: 500 mL

## 2020-10-04 MED ORDER — ORAL CARE MOUTH RINSE
15.0000 mL | Freq: Two times a day (BID) | OROMUCOSAL | Status: DC
  Administered 2020-10-04 – 2020-10-15 (×13): 15 mL via OROMUCOSAL

## 2020-10-04 MED ORDER — MIDAZOLAM HCL 2 MG/2ML IJ SOLN
INTRAMUSCULAR | Status: DC | PRN
  Administered 2020-10-04: 1 mg via INTRAVENOUS

## 2020-10-04 MED ORDER — LIDOCAINE HCL (PF) 1 % IJ SOLN
INTRAMUSCULAR | Status: DC | PRN
  Administered 2020-10-04: 2 mL

## 2020-10-04 MED ORDER — HEPARIN (PORCINE) IN NACL 1000-0.9 UT/500ML-% IV SOLN
INTRAVENOUS | Status: AC
  Filled 2020-10-04: qty 1000

## 2020-10-04 MED ORDER — HYDRALAZINE HCL 20 MG/ML IJ SOLN: 10.0000 mg | INTRAMUSCULAR | Status: AC | PRN

## 2020-10-04 MED ORDER — HEPARIN SODIUM (PORCINE) 1000 UNIT/ML IJ SOLN
INTRAMUSCULAR | Status: AC
  Filled 2020-10-04: qty 1

## 2020-10-04 MED ORDER — VERAPAMIL HCL 2.5 MG/ML IV SOLN
INTRAVENOUS | Status: DC | PRN
  Administered 2020-10-04: 10 mL via INTRA_ARTERIAL

## 2020-10-04 MED ORDER — LEVALBUTEROL HCL 1.25 MG/0.5ML IN NEBU
INHALATION_SOLUTION | RESPIRATORY_TRACT | Status: AC
  Filled 2020-10-04: qty 0.5

## 2020-10-04 MED ORDER — IOHEXOL 350 MG/ML SOLN
INTRAVENOUS | Status: DC | PRN
  Administered 2020-10-04: 45 mL

## 2020-10-04 MED ORDER — SODIUM CHLORIDE 0.9% FLUSH
3.0000 mL | Freq: Two times a day (BID) | INTRAVENOUS | Status: DC
  Administered 2020-10-05 – 2020-10-16 (×21): 3 mL via INTRAVENOUS

## 2020-10-04 MED ORDER — VERAPAMIL HCL 2.5 MG/ML IV SOLN
INTRAVENOUS | Status: AC
  Filled 2020-10-04: qty 2

## 2020-10-04 MED ORDER — FENTANYL CITRATE (PF) 100 MCG/2ML IJ SOLN
INTRAMUSCULAR | Status: DC | PRN
  Administered 2020-10-04: 25 ug via INTRAVENOUS

## 2020-10-04 MED ORDER — FENTANYL CITRATE (PF) 100 MCG/2ML IJ SOLN
INTRAMUSCULAR | Status: AC
  Filled 2020-10-04: qty 2

## 2020-10-04 MED ORDER — NITROGLYCERIN 1 MG/10 ML FOR IR/CATH LAB
INTRA_ARTERIAL | Status: AC
  Filled 2020-10-04: qty 10

## 2020-10-04 MED ORDER — LIDOCAINE HCL (PF) 1 % IJ SOLN
INTRAMUSCULAR | Status: AC
  Filled 2020-10-04: qty 30

## 2020-10-04 MED ORDER — HEPARIN SODIUM (PORCINE) 1000 UNIT/ML IJ SOLN
INTRAMUSCULAR | Status: DC | PRN
  Administered 2020-10-04: 5000 [IU] via INTRAVENOUS

## 2020-10-04 MED ORDER — SODIUM CHLORIDE 0.9 % IV SOLN: 250.0000 mL | INTRAVENOUS | Status: DC | PRN

## 2020-10-04 MED ORDER — SODIUM CHLORIDE 0.9% FLUSH: 3.0000 mL | INTRAVENOUS | Status: DC | PRN

## 2020-10-04 SURGICAL SUPPLY — 9 items

## 2020-10-04 NOTE — Progress Notes (Signed)
PROGRESS NOTE    Brett Medina  ONG:[id number] DOB: 04/27/1961 DOA: 09/29/2020 PCP: Patient, No Pcp Per     Brief Narrative:  Brett Green [name] a 59 y.o.WM PMHx Tobacco abuse.   Presented to the ED with complaints of difficulty breathing ongoing over the past 32months at least. Reports he was seen at urgent care several x40 was given prednisone. Patient quit smoking cigarettes about 2 months ago. He denies chest pain. Has chronic unchanged bilateral lower extremity swelling. Reports a productive cough over the past few weeks. No family history of heart attacks.  ED Course:O2 sats 91 to 97% on room air. Tachycardic to 135, mild tachypnea to 22.Lactic acid 1.3. WBC 13.2. Troponin 1087. UA with moderate hemoglobin. Chest CT-Constellation of findings consistent with metastatic malignancy, moderate to large volume left pleural effusion with almost complete collapse of left upper and lower lobes(see detailed report).Patient reports contrast allergy, he refused CT with contrast to rule out PE as he wastachycardic. Ceftriaxone and doxycycline given for possible pneumonia, then discontinued. Heparin drip started.Hospitalist admit for large left pleural effusion.  Cardiology was consulted for elevated troponin, following.  Post left thoracentesis on 09/30/20 by IR 950 cc of dark bloody fluid removed.    2D echo 11/29 showing reduced LVEF 40-45% with global left ventricle hypokinesis.  B/L LE duplex US negative for DVT.  10/01/20: Seen and examined with his wife at bedside.  Noted persistent non productive cough on exam.  CXR independently reviewed showing recurrent large left pleural effusion.  IR consult for repeat left thoracentesis.  Pulmonary consulted to assist with the management.    Subjective: 12/3 afebrile overnight, A/O x4.  Sitting in bed being administered breathing treatment.   Assessment & Plan: Covid vaccination;   Principal Problem:   Large pleural  effusion Active Problems:   Elevated troponin   Demand ischemia ([place])  Acute respiratory failure with hypoxia -Multifactorial acute CHF, neoplasm?,  AKI -11/29 s/p LEFT thoracentesis aspirated 950 cc fluid.  Awaiting cytology -PCXR 11/30 showed continue LEFT pleural effusion see results below -Titrate O2 to maintain SPO2> 92%, secondary to acute CHF -Incentive spirometry -Flutter valve -Xopenex QID   Elevated troponin, suspect demand ischemia in the setting of acute hypoxic respiratory failure Results for Brett, Medina (MRN [id number]) as of 10/03/2020 06:45  Ref. Range 09/29/2020 16:35 09/29/2020 20:32 09/30/2020 02:13 10/01/2020 09:17  Troponin I (High Sensitivity) Latest Ref Range: <18 ng/L 1,087 (HH) 1,380 (HH) 1,446 (HH) 746 (HH)   -Patient denies any chest pain or any anginal symptoms. -No evidence of acute ischemia on twelve-lead EKG -ASA 81 mg daily  -Heparin drip; had declined CTA chest to rule out PE due to history of allergy to IV contrast even with pretreatment. -LDL 85, started  Atorvastatin 20 mg daily  -Cardiology following  Acute systolic CHF -Presented with elevated BNP, chronic B/L LE edema -LVEF 40-45% on 2D echo 11/29 -Start strict I&O and daily weight -Labetalol PRN -Metoprolol 25 mg BID  Persistent sinus tachycardia -TSH nornal -Continue IV heparin for now,his large pleural effusion likely driving his tachycardia,but if persistent tachycardia may need to rule out PE.(Currently refusing CT with contrast due to allergy reaction,even with premedication). -B/L LE duplex US negative for DVT  Large left pleural effusion, unclear etiology. -Most likely secondary to malignancy -12/1 repeat CT chest -12/1 CT abdomen and pelvis; cancer work-up -12/1 CT head; cancer work-up -12/1 prior to transport to radiology administer Ativan -12/1 guaifenesin+ codeine for refractory cough -After results of cytology  come in from thoracentesis PCCM most likely will  proceed with EBUS to obtain tissue sample.   Leukocytosis, downtrending, likely reactive in the setting of acute hypoxic respiratory failure with large left pleural effusion. Presented with WBC 13.2K, downtrending to 10.8K Currently not on antibiotics Continue to monitor fever curve and WBC  Chronic bilateral lower extremity edema -Elevate lower extremities  AKI Lab Results  Component Value Date   CREATININE 1.21 10/04/2020   CREATININE 1.16 10/03/2020   CREATININE 1.32 (H) 10/02/2020   CREATININE 1.36 (H) 10/01/2020   CREATININE 1.35 (H) 09/30/2020  -Resolved  DVT prophylaxis: Heparin drip Code Status: Full Family Communication: 12/2 daughter at bedside for discussion of plan of care answered all questions Status is: Inpatient    Dispo: The patient is from: Home              Anticipated d/c is to: Home              Anticipated d/c date is:??              Patient currently unstable      Consultants:  PCCM Cardiology IR    Procedures/Significant Events:  11/30  LEFT thoracentesis aspirated 950 cc fluid 11/30 PCXR;Increased size of large left pleural effusion with decreased left upper lung aeration. -Right perihilar/basilar opacities are grossly unchanged 12/1 LEFT thoracentesis; aspirated 628ml maroon-colored fluid   I have personally reviewed and interpreted all radiology studies and my findings are as above.  VENTILATOR SETTINGS: Nasal cannula 12/1 Flow; 4 L/min SPO2; 97%   Cultures 11/30 LEFT thoracentesis fluid pending 12/1 LEFT thoracentesis fluid pending    Antimicrobials: Anti-infectives (From admission, onward)   Start     Ordered Stop   09/29/20 1630  cefTRIAXone (ROCEPHIN) 2 g in sodium chloride 0.9 % 100 mL IVPB  Status:  Discontinued        09/29/20 1624 09/29/20 2226   09/29/20 1630  azithromycin (ZITHROMAX) 500 mg in sodium chloride 0.9 % 250 mL IVPB  Status:  Discontinued        09/29/20 1624 09/29/20 2226        Devices    LINES / TUBES:      Continuous Infusions: . [START ON 10/05/2020] sodium chloride    . sodium chloride 1 mL/kg/hr (10/04/20 1737)  . heparin 2,100 Units/hr (10/04/20 2052)     Objective: Vitals:   10/04/20 1525 10/04/20 1631 10/04/20 1940 10/04/20 2000  BP:  (!) 120/95 (!) 134/100 (!) 127/97  Pulse:    (!) 108  Resp:  15 15 15   Temp:  97.9 F (36.6 C) 98 F (36.7 C)   TempSrc:  Oral Oral   SpO2: 99% 94% 95% 95%  Weight:      Height:        Intake/Output Summary (Last 24 hours) at 10/04/2020 2054 Last data filed at 10/04/2020 1800 Gross per 24 hour  Intake 766.7 ml  Output 1425 ml  Net -658.3 ml   Filed Weights   10/01/20 0441 10/03/20 0434 10/04/20 0500  Weight: 91.2 kg 89.9 kg 88.6 kg    Examination:  General: A/O x4, positive acute respiratory distress Eyes: negative scleral hemorrhage, negative anisocoria, negative icterus ENT: Negative Runny nose, negative gingival bleeding, Neck:  Negative scars, masses, torticollis, lymphadenopathy, JVD Lungs: diffuse decreased breath sounds LEFT >>> RIGHT.  RLL positive expiratory wheeze.  LLL positive rasping sounds with inspiration/expiration  Cardiovascular: Regular rate and rhythm without murmur gallop or rub normal S1 and S2  Abdomen: negative abdominal pain, nondistended, positive soft, bowel sounds, no rebound, no ascites, no appreciable mass Extremities: No significant cyanosis, clubbing, or edema bilateral lower extremities Skin: Negative rashes, lesions, ulcers Psychiatric:  Negative depression, negative anxiety, negative fatigue, negative mania  Central nervous system:  Cranial nerves II through XII intact, tongue/uvula midline, all extremities muscle strength 5/5, sensation intact throughout, negative dysarthria, negative expressive aphasia, negative receptive aphasia.  .     Data Reviewed: Care during the described time interval was provided by me .  I have reviewed this patient's  available data, including medical history, events of note, physical examination, and all test results as part of my evaluation.  CBC: Recent Labs  Lab 09/29/20 1635 09/29/20 1635 09/30/20 0213 10/01/20 0154 10/02/20 0135 10/03/20 0323 10/04/20 0154  WBC 13.2*   < > 10.8* 10.7* 11.5* 10.7* 10.8*  NEUTROABS 10.7*  --   --   --   --  8.6* 10.2*  HGB 14.1   < > 13.2 13.0 12.4* 12.5* 12.3*  HCT 44.2   < > 41.6 42.2 39.9 37.2* 39.5  MCV 78.6*   < > 78.3* 79.5* 79.8* 77.3* 79.0*  PLT 306   < > 278 266 276 267 256   < > = values in this interval not displayed.   Basic Metabolic Panel: Recent Labs  Lab 09/30/20 0213 10/01/20 0130 10/02/20 0135 10/03/20 0323 10/04/20 0154  NA 136 133* 135 135 133*  K 4.4 4.3 4.5 3.8 4.6  CL 100 99 98 99 102  CO2 26 28 26 23 22   GLUCOSE 110* 94 102* 102* 144*  BUN 12 13 14 10 14   CREATININE 1.35* 1.36* 1.32* 1.16 1.21  CALCIUM 9.0 8.7* 9.0 8.8* 8.9  MG  --   --  1.8 1.6* 2.0  PHOS  --   --   --  3.7 4.3   GFR: Estimated Creatinine Clearance: 73.3 mL/min (by C-G formula based on SCr of 1.21 mg/dL). Liver Function Tests: Recent Labs  Lab 09/29/20 1635 10/03/20 0323 10/04/20 0154  AST 33 30 40  ALT 29 26 27   ALKPHOS 90 88 106  BILITOT 0.6 0.6 0.5  PROT 6.3* 5.2* 5.6*  ALBUMIN 2.7* 2.1* 2.1*   No results for input(s): LIPASE, AMYLASE in the last 168 hours. No results for input(s): AMMONIA in the last 168 hours. Coagulation Profile: Recent Labs  Lab 09/29/20 1635  INR 1.2   Cardiac Enzymes: No results for input(s): CKTOTAL, CKMB, CKMBINDEX, TROPONINI in the last 168 hours. BNP (last 3 results) No results for input(s): PROBNP in the last 8760 hours. HbA1C: No results for input(s): HGBA1C in the last 72 hours. CBG: Recent Labs  Lab 10/01/20 0118 10/01/20 0638  GLUCAP 87 98   Lipid Profile: No results for input(s): CHOL, HDL, LDLCALC, TRIG, CHOLHDL, LDLDIRECT in the last 72 hours. Thyroid Function Tests: No results for  input(s): TSH, T4TOTAL, FREET4, T3FREE, THYROIDAB in the last 72 hours. Anemia Panel: No results for input(s): VITAMINB12, FOLATE, FERRITIN, TIBC, IRON, RETICCTPCT in the last 72 hours. Sepsis Labs: Recent Labs  Lab 09/29/20 1635 10/02/20 0135  PROCALCITON  --  0.25  LATICACIDVEN 1.3  --     Recent Results (from the past 240 hour(s))  Blood Culture (routine x 2)     Status: None   Collection Time: 09/29/20  4:35 PM   Specimen: BLOOD LEFT HAND  Result Value Ref Range Status   Specimen Description BLOOD LEFT HAND  Final  Special Requests   Final    BOTTLES DRAWN AEROBIC AND ANAEROBIC Blood Culture adequate volume   Culture   Final    NO GROWTH 5 DAYS Performed at [facility], [address] [place], [place] [address]    Report Status 10/04/2020 FINAL  Final  Blood Culture (routine x 2)     Status: None   Collection Time: 09/29/20  4:45 PM   Specimen: BLOOD RIGHT HAND  Result Value Ref Range Status   Specimen Description BLOOD RIGHT HAND  Final   Special Requests   Final    BOTTLES DRAWN AEROBIC AND ANAEROBIC Blood Culture results may not be optimal due to an inadequate volume of blood received in culture bottles   Culture   Final    NO GROWTH 5 DAYS Performed at [facility], [address], [place], [place] [address]    Report Status 10/04/2020 FINAL  Final  Resp Panel by RT-PCR (Flu A&B, Covid) Nasopharyngeal Swab     Status: None   Collection Time: 09/29/20  5:07 PM   Specimen: Nasopharyngeal Swab; Nasopharyngeal(NP) swabs in vial transport medium  Result Value Ref Range Status   SARS Coronavirus 2 by RT PCR NEGATIVE NEGATIVE Final    Comment: (NOTE) SARS-CoV-2 target nucleic acids are NOT DETECTED.  The SARS-CoV-2 RNA is generally detectable in upper respiratory specimens during the acute phase of infection. The lowest concentration of SARS-CoV-2 viral copies this assay can detect is 138 copies/mL. A negative result does not preclude SARS-Cov-2 infection and should  not be used as the sole basis for treatment or other patient management decisions. A negative result may occur with  improper specimen collection/handling, submission of specimen other than nasopharyngeal swab, presence of viral mutation(s) within the areas targeted by this assay, and inadequate number of viral copies(<138 copies/mL). A negative result must be combined with clinical observations, patient history, and epidemiological information. The expected result is Negative.  Fact Sheet for Patients:  EntrepreneurPulse.com.au  Fact Sheet for Healthcare Providers:  IncredibleEmployment.be  This test is no t yet approved or cleared by the [place] FDA and  has been authorized for detection and/or diagnosis of SARS-CoV-2 by FDA under an Emergency Use Authorization (EUA). This EUA will remain  in effect (meaning this test can be used) for the duration of the COVID-19 declaration under Section 564(b)(1) of the Act, 21 U.S.C.section 360bbb-3(b)(1), unless the authorization is terminated  or revoked sooner.       Influenza A by PCR NEGATIVE NEGATIVE Final   Influenza B by PCR NEGATIVE NEGATIVE Final    Comment: (NOTE) The Xpert Xpress SARS-CoV-2/FLU/RSV plus assay is intended as an aid in the diagnosis of influenza from Nasopharyngeal swab specimens and should not be used as a sole basis for treatment. Nasal washings and aspirates are unacceptable for Xpert Xpress SARS-CoV-2/FLU/RSV testing.  Fact Sheet for Patients: EntrepreneurPulse.com.au  Fact Sheet for Healthcare Providers: IncredibleEmployment.be  This test is not yet approved or cleared by the [place] FDA and has been authorized for detection and/or diagnosis of SARS-CoV-2 by FDA under an Emergency Use Authorization (EUA). This EUA will remain in effect (meaning this test can be used) for the duration of the COVID-19 declaration under  Section 564(b)(1) of the Act, 21 U.S.C. section 360bbb-3(b)(1), unless the authorization is terminated or revoked.  Performed at [facility], [address], [place], [place] [address]   Urine culture     Status: Abnormal   Collection Time: 09/29/20  5:51 PM  Specimen: In/Out Cath Urine  Result Value Ref Range Status   Specimen Description   Final    IN/OUT CATH URINE Performed at [facility], [address], [place], [place] [address]    Special Requests   Final    NONE Performed at [facility], [address] [place], [place] [address]    Culture 1,000 COLONIES/mL ENTEROCOCCUS FAECALIS (A)  Final   Report Status 10/03/2020 FINAL  Final   Organism ID, Bacteria ENTEROCOCCUS FAECALIS (A)  Final      Susceptibility   Enterococcus faecalis - MIC*    AMPICILLIN <=2 SENSITIVE Sensitive     NITROFURANTOIN <=16 SENSITIVE Sensitive     VANCOMYCIN 2 SENSITIVE Sensitive     * 1,000 COLONIES/mL ENTEROCOCCUS FAECALIS  Body fluid culture     Status: None   Collection Time: 09/30/20  9:38 AM   Specimen: Lung, Left; Pleural Fluid  Result Value Ref Range Status   Specimen Description PLEURAL FLUID  Final   Special Requests LEFT LUNG  Final   Gram Stain   Final    ABUNDANT WBC PRESENT,BOTH PMN AND MONONUCLEAR NO ORGANISMS SEEN    Culture   Final    NO GROWTH 3 DAYS Performed at [facility] [address], [place], [place] [address]    Report Status 10/03/2020 FINAL  Final  Acid Fast Smear (AFB)     Status: None   Collection Time: 09/30/20  9:38 AM   Specimen: Lung, Left; Pleural Fluid  Result Value Ref Range Status   AFB Specimen Processing Concentration  Final   Acid Fast Smear Negative  Final    Comment: (NOTE) Performed At: [facility] [place], [place] [id number] Rush Farmer MD AQ:[id number]    Source (AFB) PLEURAL  Final    Comment: FLUID LEFT LUNG Performed at [facility], [place] [address], [place], [place] [address]           Radiology Studies: CARDIAC CATHETERIZATION  Result Date: 10/04/2020  Dist LAD lesion is 50% stenosed.  1st RPL lesion is 20% stenosed.  Mid RCA lesion is 25% stenosed.  2nd Diag lesion is 40% stenosed.  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 No severe lesions or culprit for NSTEMI - suspect demand ischemia        Scheduled Meds: . aspirin EC  81 mg Oral Daily  . atorvastatin  20 mg Oral Daily  . levalbuterol  1.25 mg Nebulization Q6H  . mouth rinse  15 mL Mouth Rinse BID  . metoprolol tartrate  25 mg Oral BID  . sodium chloride flush  3 mL Intravenous Q12H  . [START ON 10/05/2020] sodium chloride flush  3 mL Intravenous Q12H   Continuous Infusions: . [START ON 10/05/2020] sodium chloride    . sodium chloride 1 mL/kg/hr (10/04/20 1737)  . heparin 2,100 Units/hr (10/04/20 2052)     LOS: 5 days    Time spent:40 min    Kinnick Maus, Geraldo Docker, MD [facility] Pager [phone]  If 7PM-7AM, please contact night-coverage [email or web address] Password [facility] 10/04/2020, 8:54 PM

## 2020-10-04 NOTE — H&P (View-Only) (Signed)
Progress Note  Patient Name: Brett Medina Date of Encounter: 10/04/2020  Primary Cardiologist: Elouise Munroe, MD   Subjective   He feels stable and has breathing today.  He questions whether he will be able to lie flat for cath.  Inpatient Medications    Scheduled Meds: . aspirin EC  81 mg Oral Daily  . atorvastatin  20 mg Oral Daily  . diphenhydrAMINE  50 mg Oral Once   Or  . diphenhydrAMINE  50 mg Intravenous Once  . levalbuterol      . levalbuterol  1.25 mg Nebulization Q6H  . mouth rinse  15 mL Mouth Rinse BID  . metoprolol tartrate  25 mg Oral BID  . predniSONE  50 mg Oral Q6H  . sodium chloride flush  3 mL Intravenous Q12H   Continuous Infusions: . sodium chloride    . sodium chloride    . heparin 2,100 Units/hr (10/04/20 0620)   PRN Meds: sodium chloride, acetaminophen **OR** acetaminophen, guaiFENesin-codeine, labetalol, lidocaine (PF), lidocaine, loperamide, ondansetron **OR** ondansetron (ZOFRAN) IV, polyethylene glycol, sodium chloride flush   Vital Signs    Vitals:   10/04/20 0500 10/04/20 0713 10/04/20 0744 10/04/20 0843  BP:  (!) 134/95    Pulse:  92  (!) 118  Resp:  19    Temp:  97.9 F (36.6 C)    TempSrc:  Oral    SpO2:  96% 95%   Weight: 88.6 kg     Height:        Intake/Output Summary (Last 24 hours) at 10/04/2020 1028 Last data filed at 10/04/2020 0620 Gross per 24 hour  Intake 2075.81 ml  Output 1375 ml  Net 700.81 ml   Filed Weights   10/01/20 0441 10/03/20 0434 10/04/20 0500  Weight: 91.2 kg 89.9 kg 88.6 kg    Telemetry    Sinus rhythm, sinus tachycardia, and intermittent nonsustained VT, 4-5 beats at a time- Personally Reviewed  ECG    EKG obtained overnight due to rapid rates.  Suspect short episodes of atrial tachycardia.  Due to brief episodes, seems less consistent with atrial fibrillation.- Personally Reviewed  Physical Exam   GEN: No acute distress.   Neck: No JVD Cardiac:  Regular rhythm, tachycardic, no  murmur Respiratory:  Diminished left base and bilateral upper respiratory sounds GI: Soft, nontender, non-distended  MS: No edema; No deformity. Neuro:  Nonfocal  Psych: Normal affect   Labs    Chemistry Recent Labs  Lab 09/29/20 1635 09/30/20 0213 10/02/20 0135 10/03/20 0323 10/04/20 0154  NA 132*   < > 135 135 133*  K 3.7   < > 4.5 3.8 4.6  CL 100   < > 98 99 102  CO2 22   < > 26 23 22   GLUCOSE 113*   < > 102* 102* 144*  BUN 13   < > 14 10 14   CREATININE 1.14   < > 1.32* 1.16 1.21  CALCIUM 9.0   < > 9.0 8.8* 8.9  PROT 6.3*  --   --  5.2* 5.6*  ALBUMIN 2.7*  --   --  2.1* 2.1*  AST 33  --   --  30 40  ALT 29  --   --  26 27  ALKPHOS 90  --   --  88 106  BILITOT 0.6  --   --  0.6 0.5  GFRNONAA >60   < > >60 >60 >60  ANIONGAP 10   < > 11  13 9   < > = values in this interval not displayed.     Hematology Recent Labs  Lab 10/02/20 0135 10/03/20 0323 10/04/20 0154  WBC 11.5* 10.7* 10.8*  RBC 5.00 4.81 5.00  HGB 12.4* 12.5* 12.3*  HCT 39.9 37.2* 39.5  MCV 79.8* 77.3* 79.0*  MCH 24.8* 26.0 24.6*  MCHC 31.1 33.6 31.1  RDW 17.0* 16.9* 17.0*  PLT 276 267 256    Cardiac EnzymesNo results for input(s): TROPONINI in the last 168 hours. No results for input(s): TROPIPOC in the last 168 hours.   BNP Recent Labs  Lab 09/29/20 1635  BNP 205.0*     DDimer No results for input(s): DDIMER in the last 168 hours.   Radiology    CT ABDOMEN PELVIS WO CONTRAST  Result Date: 10/02/2020 CLINICAL DATA:  60 year old male with cancer of unknown primary. EXAM: CT CHEST, ABDOMEN AND PELVIS WITHOUT CONTRAST TECHNIQUE: Multidetector CT imaging of the chest, abdomen and pelvis was performed following the standard protocol without IV contrast. COMPARISON:  Chest radiograph dated 10/02/2020. chest CT dated 09/29/2020. FINDINGS: Evaluation of this exam is limited in the absence of intravenous contrast. CT CHEST FINDINGS Cardiovascular: There is no cardiomegaly or pericardial effusion.  The thoracic aorta and central pulmonary arteries are grossly unremarkable. Mediastinum/Nodes: Mildly enlarged right paratracheal lymph node measures 12 mm. The esophagus and the thyroid gland are grossly unremarkable. No mediastinal fluid collection. Lungs/Pleura: There is a 10.0 x 7.5 x 12.0 cm mass in the left upper lobe. There is diffuse interstitial coarsening and nodularity of the left lung with smaller scattered nodules concerning for metastatic spread. There is a small left pleural effusion, likely malignant effusion. There is consolidative changes of the majority of the left lung which may be combination of atelectasis, infiltrate, or metastasis. Several scattered nodules in the right lung measure up to 1 cm consistent with metastatic disease. There is a patchy area of nodular and ground-glass density in the right infrahilar region which may represent pneumonia, or aspiration. Metastasis is not excluded. There is background of moderate to severe centrilobular emphysema. No pneumothorax. There is compression and high-grade narrowing of the left lower lobe bronchus and occlusion of the left upper lobe bronchus by the left lung mass. There is apparent nodular thickening of the left pleural consistent with metastatic implant. Musculoskeletal: There is sclerotic lesion in the T6 as well as sclerotic changes of C7. Small sclerotic focus in T9. CT ABDOMEN PELVIS FINDINGS No intra-abdominal free air or free fluid. Hepatobiliary: Multiple hepatic hypodense lesions consistent with metastatic disease. These measure up to 2.5 cm in the left lobe of the liver. 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 No calcified gallstone or pericholecystic fluid. Pancreas: The pancreas is grossly unremarkable. Spleen: Heterogeneous appearance of the spleen. Adrenals/Urinary Tract: The adrenal glands are unremarkable. Multiple nonobstructing bilateral renal calculi measure up to 8 mm in the inferior pole of the  right kidney. There is no hydronephrosis on either side. There is a 6 cm right renal cyst. The visualized ureters and urinary bladder appear unremarkable. Stomach/Bowel: There is circumferential thickening of the rectosigmoid involving approximately 9 cm segment (95/8) which may be inflammatory in etiology. There is however apparent shouldering of the soft tissue (sagittal 93/8) concerning for underlying malignancy. Clinical correlation and further evaluation with sigmoidoscopy or colonoscopy is recommended. There is moderate stool throughout the colon. There is colonic diverticulosis without active inflammatory changes. There is no bowel obstruction. The appendix is normal. Vascular/Lymphatic: The  abdominal aorta and IVC unremarkable. No portal venous gas. No adenopathy. Reproductive: The prostate and seminal vesicles are grossly unremarkable. Other: Subcutaneous edema of the lateral pelvic wall. No fluid collection. Musculoskeletal: Faint irregular lucency involving the left superior pubic ramus concerning for metastatic disease. Several scattered small sclerotic lesions also noted involving the right ischium, sacral bone also concerning for metastatic disease. No acute fracture. IMPRESSION: 1. Large left upper lobe mass with findings of metastatic disease in the lungs, liver, and bones. 2. Circumferential thickening of the rectosigmoid with apparent shouldering concerning for underlying malignancy. Clinical correlation and further evaluation with sigmoidoscopy or colonoscopy is recommended. 3. Nonobstructing bilateral renal calculi. No hydronephrosis. 4. Colonic diverticulosis. No bowel obstruction. Normal appendix. 5. Aortic Atherosclerosis (ICD10-I70.0) and Emphysema (ICD10-J43.9). Electronically Signed   By: Anner Crete M.D.   On: 10/02/2020 19:46   CT HEAD WO CONTRAST  Result Date: 10/02/2020 CLINICAL DATA:  Cancer of unknown primary EXAM: CT HEAD WITHOUT CONTRAST TECHNIQUE: Contiguous axial images  were obtained from the base of the skull through the vertex without intravenous contrast. COMPARISON:  None. FINDINGS: Brain: Multiple acquisitions required due to patient motion artifact. Hypoattenuating foci are seen subcortical white matter of the right frontal lobe (8/8 and right insula (8/1) without significant surrounding vasogenic edema. Additional hyperattenuating focus seen near the gray-white interface of the high left frontal lobe (10/40). With some questionable surrounding vasogenic edema. There is a background of chronic microvascular angiopathy and parenchymal volume loss. No other acute intracranial abnormality is seen. No significant mass effect or midline shift is evident at this time. Vascular: Atherosclerotic calcification of the carotid siphons and intradural vertebral arteries. No hyperdense vessel. Skull: No calvarial fracture or suspicious osseous lesion. No scalp swelling or hematoma. Sinuses/Orbits: Paranasal sinuses and mastoid air cells are predominantly clear. Included orbital structures are unremarkable. Other: None. IMPRESSION: Severely motion degraded imaging despite multiple attempts at acquisition. Ill-defined hyperattenuating focus near the gray-white junction in the high left frontal lobe with some questionable surrounding hypoattenuation, possibly vasogenic edema raises concern for potential metastatic focus in the setting of cancer of unknown primary. Consider further evaluation with contrast enhanced MRI as patient is able to tolerate. May require sedation given extensive motion artifact on this CT imaging. Hypoattenuating foci in the right frontal lobe and insula, could reflect remote sequela of prior ischemia or infarct. Though could be better assessed on MR imaging as well. Electronically Signed   By: Lovena Le M.D.   On: 10/02/2020 19:37   CT CHEST WO CONTRAST  Result Date: 10/02/2020 CLINICAL DATA:  59 year old male with cancer of unknown primary. EXAM: CT CHEST,  ABDOMEN AND PELVIS WITHOUT CONTRAST TECHNIQUE: Multidetector CT imaging of the chest, abdomen and pelvis was performed following the standard protocol without IV contrast. COMPARISON:  Chest radiograph dated 10/02/2020. chest CT dated 09/29/2020. FINDINGS: Evaluation of this exam is limited in the absence of intravenous contrast. CT CHEST FINDINGS Cardiovascular: There is no cardiomegaly or pericardial effusion. The thoracic aorta and central pulmonary arteries are grossly unremarkable. Mediastinum/Nodes: Mildly enlarged right paratracheal lymph node measures 12 mm. The esophagus and the thyroid gland are grossly unremarkable. No mediastinal fluid collection. Lungs/Pleura: There is a 10.0 x 7.5 x 12.0 cm mass in the left upper lobe. There is diffuse interstitial coarsening and nodularity of the left lung with smaller scattered nodules concerning for metastatic spread. There is a small left pleural effusion, likely malignant effusion. There is consolidative changes of the majority of the left lung which may  be combination of atelectasis, infiltrate, or metastasis. Several scattered nodules in the right lung measure up to 1 cm consistent with metastatic disease. There is a patchy area of nodular and ground-glass density in the right infrahilar region which may represent pneumonia, or aspiration. Metastasis is not excluded. There is background of moderate to severe centrilobular emphysema. No pneumothorax. There is compression and high-grade narrowing of the left lower lobe bronchus and occlusion of the left upper lobe bronchus by the left lung mass. There is apparent nodular thickening of the left pleural consistent with metastatic implant. Musculoskeletal: There is sclerotic lesion in the T6 as well as sclerotic changes of C7. Small sclerotic focus in T9. CT ABDOMEN PELVIS FINDINGS No intra-abdominal free air or free fluid. Hepatobiliary: Multiple hepatic hypodense lesions consistent with metastatic disease. These  measure up to 2.5 cm in the left lobe of the liver. 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 No calcified gallstone or pericholecystic fluid. Pancreas: The pancreas is grossly unremarkable. Spleen: Heterogeneous appearance of the spleen. Adrenals/Urinary Tract: The adrenal glands are unremarkable. Multiple nonobstructing bilateral renal calculi measure up to 8 mm in the inferior pole of the right kidney. There is no hydronephrosis on either side. There is a 6 cm right renal cyst. The visualized ureters and urinary bladder appear unremarkable. Stomach/Bowel: There is circumferential thickening of the rectosigmoid involving approximately 9 cm segment (95/8) which may be inflammatory in etiology. There is however apparent shouldering of the soft tissue (sagittal 93/8) concerning for underlying malignancy. Clinical correlation and further evaluation with sigmoidoscopy or colonoscopy is recommended. There is moderate stool throughout the colon. There is colonic diverticulosis without active inflammatory changes. There is no bowel obstruction. The appendix is normal. Vascular/Lymphatic: The abdominal aorta and IVC unremarkable. No portal venous gas. No adenopathy. Reproductive: The prostate and seminal vesicles are grossly unremarkable. Other: Subcutaneous edema of the lateral pelvic wall. No fluid collection. Musculoskeletal: Faint irregular lucency involving the left superior pubic ramus concerning for metastatic disease. Several scattered small sclerotic lesions also noted involving the right ischium, sacral bone also concerning for metastatic disease. No acute fracture. IMPRESSION: 1. Large left upper lobe mass with findings of metastatic disease in the lungs, liver, and bones. 2. Circumferential thickening of the rectosigmoid with apparent shouldering concerning for underlying malignancy. Clinical correlation and further evaluation with sigmoidoscopy or colonoscopy is recommended. 3.  Nonobstructing bilateral renal calculi. No hydronephrosis. 4. Colonic diverticulosis. No bowel obstruction. Normal appendix. 5. Aortic Atherosclerosis (ICD10-I70.0) and Emphysema (ICD10-J43.9). Electronically Signed   By: Anner Crete M.D.   On: 10/02/2020 19:46    Cardiac Studies   Echo 09/30/2020 1. Left ventricular ejection fraction, by estimation, is 40 to 45%. The  left ventricle has mildly decreased function. The left ventricle  demonstrates global hypokinesis. Left ventricular diastolic parameters are  indeterminate.  2. Right ventricular systolic function is normal. The right ventricular  size is normal. Tricuspid regurgitation signal is inadequate for assessing  PA pressure.  3. The mitral valve is normal in structure. No evidence of mitral valve  regurgitation. No evidence of mitral stenosis.  4. The aortic valve was not well visualized. Aortic valve regurgitation  is trivial. No aortic stenosis is present.  5. Aortic dilatation noted. 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  6. The inferior vena cava is dilated in size with <50% respiratory  variability, suggesting right atrial pressure of 15 mmHg.    Patient Profile     59  y.o. male with no documented past medical history but evidence of hypertension, and tobacco smoking and no regular primary care follow-up who presented to emergency room  with 6-months history of progressive worsening dyspnea on exertion, orthopnea, PND and lower extremity edema. [place] to have acute hypoxic respiratory failure and large L pleural effusion and elevated tropoins with reduced EF.     Assessment & Plan   Principal Problem:   Large pleural effusion Active Problems:   Elevated troponin   He is planned for diagnostic cath today, consent and discussion performed yesterday, see yesterday's progress note.  With sedation he was able to lie flat for CT scanning, hopefully we will experience the same during  catheterization today with anesthesia.  I remain concerned about his ability to lie flat, but procedure is for diagnostic angiography and we may be able to complete this successfully.  Happy to discuss with the interventional cardiologist as needed.  Continue metoprolol, aspirin, atorvastatin.  Cytology of pleural fluid is pending.       For questions or updates, please contact [place] Please consult [email or web address] for contact info under        Signed, Elouise Munroe, MD  10/04/2020, 10:28 AM

## 2020-10-04 NOTE — Progress Notes (Signed)
Progress Note  Patient Name: Brett Medina Date of Encounter: 10/04/2020  Primary Cardiologist: Elouise Munroe, MD   Subjective   He feels stable and has breathing today.  He questions whether he will be able to lie flat for cath.  Inpatient Medications    Scheduled Meds: . aspirin EC  81 mg Oral Daily  . atorvastatin  20 mg Oral Daily  . diphenhydrAMINE  50 mg Oral Once   Or  . diphenhydrAMINE  50 mg Intravenous Once  . levalbuterol      . levalbuterol  1.25 mg Nebulization Q6H  . mouth rinse  15 mL Mouth Rinse BID  . metoprolol tartrate  25 mg Oral BID  . predniSONE  50 mg Oral Q6H  . sodium chloride flush  3 mL Intravenous Q12H   Continuous Infusions: . sodium chloride    . sodium chloride    . heparin 2,100 Units/hr (10/04/20 0620)   PRN Meds: sodium chloride, acetaminophen **OR** acetaminophen, guaiFENesin-codeine, labetalol, lidocaine (PF), lidocaine, loperamide, ondansetron **OR** ondansetron (ZOFRAN) IV, polyethylene glycol, sodium chloride flush   Vital Signs    Vitals:   10/04/20 0500 10/04/20 0713 10/04/20 0744 10/04/20 0843  BP:  (!) 134/95    Pulse:  92  (!) 118  Resp:  19    Temp:  97.9 F (36.6 C)    TempSrc:  Oral    SpO2:  96% 95%   Weight: 88.6 kg     Height:        Intake/Output Summary (Last 24 hours) at 10/04/2020 1028 Last data filed at 10/04/2020 0620 Gross per 24 hour  Intake 2075.81 ml  Output 1375 ml  Net 700.81 ml   Filed Weights   10/01/20 0441 10/03/20 0434 10/04/20 0500  Weight: 91.2 kg 89.9 kg 88.6 kg    Telemetry    Sinus rhythm, sinus tachycardia, and intermittent nonsustained VT, 4-5 beats at a time- Personally Reviewed  ECG    EKG obtained overnight due to rapid rates.  Suspect short episodes of atrial tachycardia.  Due to brief episodes, seems less consistent with atrial fibrillation.- Personally Reviewed  Physical Exam   GEN: No acute distress.   Neck: No JVD Cardiac:  Regular rhythm, tachycardic, no  murmur Respiratory:  Diminished left base and bilateral upper respiratory sounds GI: Soft, nontender, non-distended  MS: No edema; No deformity. Neuro:  Nonfocal  Psych: Normal affect   Labs    Chemistry Recent Labs  Lab 09/29/20 1635 09/30/20 0213 10/02/20 0135 10/03/20 0323 10/04/20 0154  NA 132*   < > 135 135 133*  K 3.7   < > 4.5 3.8 4.6  CL 100   < > 98 99 102  CO2 22   < > 26 23 22   GLUCOSE 113*   < > 102* 102* 144*  BUN 13   < > 14 10 14   CREATININE 1.14   < > 1.32* 1.16 1.21  CALCIUM 9.0   < > 9.0 8.8* 8.9  PROT 6.3*  --   --  5.2* 5.6*  ALBUMIN 2.7*  --   --  2.1* 2.1*  AST 33  --   --  30 40  ALT 29  --   --  26 27  ALKPHOS 90  --   --  88 106  BILITOT 0.6  --   --  0.6 0.5  GFRNONAA >60   < > >60 >60 >60  ANIONGAP 10   < > 11  13 9   < > = values in this interval not displayed.     Hematology Recent Labs  Lab 10/02/20 0135 10/03/20 0323 10/04/20 0154  WBC 11.5* 10.7* 10.8*  RBC 5.00 4.81 5.00  HGB 12.4* 12.5* 12.3*  HCT 39.9 37.2* 39.5  MCV 79.8* 77.3* 79.0*  MCH 24.8* 26.0 24.6*  MCHC 31.1 33.6 31.1  RDW 17.0* 16.9* 17.0*  PLT 276 267 256    Cardiac EnzymesNo results for input(s): TROPONINI in the last 168 hours. No results for input(s): TROPIPOC in the last 168 hours.   BNP Recent Labs  Lab 09/29/20 1635  BNP 205.0*     DDimer No results for input(s): DDIMER in the last 168 hours.   Radiology    CT ABDOMEN PELVIS WO CONTRAST  Result Date: 10/02/2020 CLINICAL DATA:  59 year old male with cancer of unknown primary. EXAM: CT CHEST, ABDOMEN AND PELVIS WITHOUT CONTRAST TECHNIQUE: Multidetector CT imaging of the chest, abdomen and pelvis was performed following the standard protocol without IV contrast. COMPARISON:  Chest radiograph dated 10/02/2020. chest CT dated 09/29/2020. FINDINGS: Evaluation of this exam is limited in the absence of intravenous contrast. CT CHEST FINDINGS Cardiovascular: There is no cardiomegaly or pericardial effusion.  The thoracic aorta and central pulmonary arteries are grossly unremarkable. Mediastinum/Nodes: Mildly enlarged right paratracheal lymph node measures 12 mm. The esophagus and the thyroid gland are grossly unremarkable. No mediastinal fluid collection. Lungs/Pleura: There is a 10.0 x 7.5 x 12.0 cm mass in the left upper lobe. There is diffuse interstitial coarsening and nodularity of the left lung with smaller scattered nodules concerning for metastatic spread. There is a small left pleural effusion, likely malignant effusion. There is consolidative changes of the majority of the left lung which may be combination of atelectasis, infiltrate, or metastasis. Several scattered nodules in the right lung measure up to 1 cm consistent with metastatic disease. There is a patchy area of nodular and ground-glass density in the right infrahilar region which may represent pneumonia, or aspiration. Metastasis is not excluded. There is background of moderate to severe centrilobular emphysema. No pneumothorax. There is compression and high-grade narrowing of the left lower lobe bronchus and occlusion of the left upper lobe bronchus by the left lung mass. There is apparent nodular thickening of the left pleural consistent with metastatic implant. Musculoskeletal: There is sclerotic lesion in the T6 as well as sclerotic changes of C7. Small sclerotic focus in T9. CT ABDOMEN PELVIS FINDINGS No intra-abdominal free air or free fluid. Hepatobiliary: Multiple hepatic hypodense lesions consistent with metastatic disease. These measure up to 2.5 cm in the left lobe of the liver. 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 No calcified gallstone or pericholecystic fluid. Pancreas: The pancreas is grossly unremarkable. Spleen: Heterogeneous appearance of the spleen. Adrenals/Urinary Tract: The adrenal glands are unremarkable. Multiple nonobstructing bilateral renal calculi measure up to 8 mm in the inferior pole of the  right kidney. There is no hydronephrosis on either side. There is a 6 cm right renal cyst. The visualized ureters and urinary bladder appear unremarkable. Stomach/Bowel: There is circumferential thickening of the rectosigmoid involving approximately 9 cm segment (95/8) which may be inflammatory in etiology. There is however apparent shouldering of the soft tissue (sagittal 93/8) concerning for underlying malignancy. Clinical correlation and further evaluation with sigmoidoscopy or colonoscopy is recommended. There is moderate stool throughout the colon. There is colonic diverticulosis without active inflammatory changes. There is no bowel obstruction. The appendix is normal. Vascular/Lymphatic: The  abdominal aorta and IVC unremarkable. No portal venous gas. No adenopathy. Reproductive: The prostate and seminal vesicles are grossly unremarkable. Other: Subcutaneous edema of the lateral pelvic wall. No fluid collection. Musculoskeletal: Faint irregular lucency involving the left superior pubic ramus concerning for metastatic disease. Several scattered small sclerotic lesions also noted involving the right ischium, sacral bone also concerning for metastatic disease. No acute fracture. IMPRESSION: 1. Large left upper lobe mass with findings of metastatic disease in the lungs, liver, and bones. 2. Circumferential thickening of the rectosigmoid with apparent shouldering concerning for underlying malignancy. Clinical correlation and further evaluation with sigmoidoscopy or colonoscopy is recommended. 3. Nonobstructing bilateral renal calculi. No hydronephrosis. 4. Colonic diverticulosis. No bowel obstruction. Normal appendix. 5. Aortic Atherosclerosis (ICD10-I70.0) and Emphysema (ICD10-J43.9). Electronically Signed   By: Anner Crete M.D.   On: 10/02/2020 19:46   CT HEAD WO CONTRAST  Result Date: 10/02/2020 CLINICAL DATA:  Cancer of unknown primary EXAM: CT HEAD WITHOUT CONTRAST TECHNIQUE: Contiguous axial images  were obtained from the base of the skull through the vertex without intravenous contrast. COMPARISON:  None. FINDINGS: Brain: Multiple acquisitions required due to patient motion artifact. Hypoattenuating foci are seen subcortical white matter of the right frontal lobe (8/8 and right insula (8/1) without significant surrounding vasogenic edema. Additional hyperattenuating focus seen near the gray-white interface of the high left frontal lobe (10/40). With some questionable surrounding vasogenic edema. There is a background of chronic microvascular angiopathy and parenchymal volume loss. No other acute intracranial abnormality is seen. No significant mass effect or midline shift is evident at this time. Vascular: Atherosclerotic calcification of the carotid siphons and intradural vertebral arteries. No hyperdense vessel. Skull: No calvarial fracture or suspicious osseous lesion. No scalp swelling or hematoma. Sinuses/Orbits: Paranasal sinuses and mastoid air cells are predominantly clear. Included orbital structures are unremarkable. Other: None. IMPRESSION: Severely motion degraded imaging despite multiple attempts at acquisition. Ill-defined hyperattenuating focus near the gray-white junction in the high left frontal lobe with some questionable surrounding hypoattenuation, possibly vasogenic edema raises concern for potential metastatic focus in the setting of cancer of unknown primary. Consider further evaluation with contrast enhanced MRI as patient is able to tolerate. May require sedation given extensive motion artifact on this CT imaging. Hypoattenuating foci in the right frontal lobe and insula, could reflect remote sequela of prior ischemia or infarct. Though could be better assessed on MR imaging as well. Electronically Signed   By: Lovena Le M.D.   On: 10/02/2020 19:37   CT CHEST WO CONTRAST  Result Date: 10/02/2020 CLINICAL DATA:  59 year old male with cancer of unknown primary. EXAM: CT CHEST,  ABDOMEN AND PELVIS WITHOUT CONTRAST TECHNIQUE: Multidetector CT imaging of the chest, abdomen and pelvis was performed following the standard protocol without IV contrast. COMPARISON:  Chest radiograph dated 10/02/2020. chest CT dated 09/29/2020. FINDINGS: Evaluation of this exam is limited in the absence of intravenous contrast. CT CHEST FINDINGS Cardiovascular: There is no cardiomegaly or pericardial effusion. The thoracic aorta and central pulmonary arteries are grossly unremarkable. Mediastinum/Nodes: Mildly enlarged right paratracheal lymph node measures 12 mm. The esophagus and the thyroid gland are grossly unremarkable. No mediastinal fluid collection. Lungs/Pleura: There is a 10.0 x 7.5 x 12.0 cm mass in the left upper lobe. There is diffuse interstitial coarsening and nodularity of the left lung with smaller scattered nodules concerning for metastatic spread. There is a small left pleural effusion, likely malignant effusion. There is consolidative changes of the majority of the left lung which may  be combination of atelectasis, infiltrate, or metastasis. Several scattered nodules in the right lung measure up to 1 cm consistent with metastatic disease. There is a patchy area of nodular and ground-glass density in the right infrahilar region which may represent pneumonia, or aspiration. Metastasis is not excluded. There is background of moderate to severe centrilobular emphysema. No pneumothorax. There is compression and high-grade narrowing of the left lower lobe bronchus and occlusion of the left upper lobe bronchus by the left lung mass. There is apparent nodular thickening of the left pleural consistent with metastatic implant. Musculoskeletal: There is sclerotic lesion in the T6 as well as sclerotic changes of C7. Small sclerotic focus in T9. CT ABDOMEN PELVIS FINDINGS No intra-abdominal free air or free fluid. Hepatobiliary: Multiple hepatic hypodense lesions consistent with metastatic disease. These  measure up to 2.5 cm in the left lobe of the liver. 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 No calcified gallstone or pericholecystic fluid. Pancreas: The pancreas is grossly unremarkable. Spleen: Heterogeneous appearance of the spleen. Adrenals/Urinary Tract: The adrenal glands are unremarkable. Multiple nonobstructing bilateral renal calculi measure up to 8 mm in the inferior pole of the right kidney. There is no hydronephrosis on either side. There is a 6 cm right renal cyst. The visualized ureters and urinary bladder appear unremarkable. Stomach/Bowel: There is circumferential thickening of the rectosigmoid involving approximately 9 cm segment (95/8) which may be inflammatory in etiology. There is however apparent shouldering of the soft tissue (sagittal 93/8) concerning for underlying malignancy. Clinical correlation and further evaluation with sigmoidoscopy or colonoscopy is recommended. There is moderate stool throughout the colon. There is colonic diverticulosis without active inflammatory changes. There is no bowel obstruction. The appendix is normal. Vascular/Lymphatic: The abdominal aorta and IVC unremarkable. No portal venous gas. No adenopathy. Reproductive: The prostate and seminal vesicles are grossly unremarkable. Other: Subcutaneous edema of the lateral pelvic wall. No fluid collection. Musculoskeletal: Faint irregular lucency involving the left superior pubic ramus concerning for metastatic disease. Several scattered small sclerotic lesions also noted involving the right ischium, sacral bone also concerning for metastatic disease. No acute fracture. IMPRESSION: 1. Large left upper lobe mass with findings of metastatic disease in the lungs, liver, and bones. 2. Circumferential thickening of the rectosigmoid with apparent shouldering concerning for underlying malignancy. Clinical correlation and further evaluation with sigmoidoscopy or colonoscopy is recommended. 3.  Nonobstructing bilateral renal calculi. No hydronephrosis. 4. Colonic diverticulosis. No bowel obstruction. Normal appendix. 5. Aortic Atherosclerosis (ICD10-I70.0) and Emphysema (ICD10-J43.9). Electronically Signed   By: Anner Crete M.D.   On: 10/02/2020 19:46    Cardiac Studies   Echo 09/30/2020 1. Left ventricular ejection fraction, by estimation, is 40 to 45%. The  left ventricle has mildly decreased function. The left ventricle  demonstrates global hypokinesis. Left ventricular diastolic parameters are  indeterminate.  2. Right ventricular systolic function is normal. The right ventricular  size is normal. Tricuspid regurgitation signal is inadequate for assessing  PA pressure.  3. The mitral valve is normal in structure. No evidence of mitral valve  regurgitation. No evidence of mitral stenosis.  4. The aortic valve was not well visualized. Aortic valve regurgitation  is trivial. No aortic stenosis is present.  5. Aortic dilatation noted. 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  6. The inferior vena cava is dilated in size with <50% respiratory  variability, suggesting right atrial pressure of 15 mmHg.    Patient Profile     59  y.o. male with no documented past medical history but evidence of hypertension, and tobacco smoking and no regular primary care follow-up who presented to emergency room  with 25-months history of progressive worsening dyspnea on exertion, orthopnea, PND and lower extremity edema. [place] to have acute hypoxic respiratory failure and large L pleural effusion and elevated tropoins with reduced EF.     Assessment & Plan   Principal Problem:   Large pleural effusion Active Problems:   Elevated troponin   He is planned for diagnostic cath today, consent and discussion performed yesterday, see yesterday's progress note.  With sedation he was able to lie flat for CT scanning, hopefully we will experience the same during  catheterization today with anesthesia.  I remain concerned about his ability to lie flat, but procedure is for diagnostic angiography and we may be able to complete this successfully.  Happy to discuss with the interventional cardiologist as needed.  Continue metoprolol, aspirin, atorvastatin.  Cytology of pleural fluid is pending.       For questions or updates, please contact [place] Please consult [email or web address] for contact info under        Signed, Elouise Munroe, MD  10/04/2020, 10:28 AM

## 2020-10-04 NOTE — Progress Notes (Signed)
Patient arrived back to 4E21 from cath lab. TR band in place on R wrist, inflated with 14cc of air. Sensation intact. VSS and patient denies any pain. Patient educated about keeping right arm still and elevated. Will continue to monitor closely.   Gailen Shelter RN

## 2020-10-04 NOTE — Interval H&P Note (Signed)
History and Physical Interval Note:  10/04/2020 3:44 PM  Brett Medina  has presented today for surgery, with the diagnosis of nonstemi.  The various methods of treatment have been discussed with the patient and family. After consideration of risks, benefits and other options for treatment, the patient has consented to  Procedure(s): LEFT HEART CATH AND CORONARY ANGIOGRAPHY (N/A) as a surgical intervention.  The patient's history has been reviewed, patient examined, no change in status, stable for surgery.  I have reviewed the patient's chart and labs.  Questions were answered to the patient's satisfaction.     Sherren Mocha

## 2020-10-04 NOTE — Progress Notes (Signed)
ANTICOAGULATION CONSULT NOTE - Follow Up Consult  Pharmacy Consult for Heparin Indication: elevated troponin and rule out PE  Allergies  Allergen Reactions  . Contrast Media [Iodinated Diagnostic Agents]     Flushing, warmth, even with pre-medication    Patient Measurements: Height: 5\' 9"  (175.3 cm) Weight: 88.6 kg (195 lb 6.4 oz) IBW/kg (Calculated) : 70.7 Heparin Dosing Weight: 91 kg  Vital Signs: Temp: 97.9 F (36.6 C) (12/03 0713) Temp Source: Oral (12/03 0713) BP: 134/95 (12/03 0713) Pulse Rate: 92 (12/03 0713)  Labs: Recent Labs    10/01/20 0907 10/01/20 0917 10/02/20 0135 10/02/20 0135 10/03/20 0323 10/03/20 1450 10/04/20 0154  HGB  --   --  12.4*   < > 12.5*  --  12.3*  HCT  --   --  39.9  --  37.2*  --  39.5  PLT  --   --  276  --  267  --  256  HEPARINUNFRC   < >  --  0.35   < > 0.22* 0.36 0.37  CREATININE  --   --  1.32*  --  1.16  --  1.21  TROPONINIHS  --  746*  --   --   --   --   --    < > = values in this interval not displayed.    Estimated Creatinine Clearance: 73.3 mL/min (by C-G formula based on SCr of 1.21 mg/dL).  Assessment:  59 yr old male admitted 11/28 pm with shortness of breath. Found to have large volume left pleural effusion, concern for metastatic malignancy. Refused CT to rule out PE due to reported hx contrast allergy even with premedication >flushing and warmth reported. No shortness of breath of skin reaction.  IV heparin begun 11/28 pm.   S/p thoracentesis 11/29 (950 ml dark old blood removed).  Heparin level is therapeutic today at 0.37, on 2100 units/hr. CBC stable today. Duplex negative for DVT. No s/sx of bleeding.   Goal of Therapy:  Heparin level 0.3-0.7 units/ml Monitor platelets by anticoagulation protocol: Yes   Plan:  Continue heparin drip at 2100 units/hr. Daily heparin level and CBC while on heparin. F/u [facility] plans following cytology and EBUS    Antonietta Jewel, PharmD, [place] Clinical Pharmacist  Phone:  [phone] 10/04/2020 7:52 AM  Please check AMION for all [place] phone numbers After 10:00 PM, call [place] [phone]

## 2020-10-05 DIAGNOSIS — R918 Other nonspecific abnormal finding of lung field: Secondary | ICD-10-CM

## 2020-10-05 LAB — CBC WITH DIFFERENTIAL/PLATELET
Abs Immature Granulocytes: 0.15 10*3/uL — ABNORMAL HIGH (ref 0.00–0.07)
Basophils Absolute: 0 10*3/uL (ref 0.0–0.1)
Basophils Relative: 0 %
Eosinophils Absolute: 0 10*3/uL (ref 0.0–0.5)
Eosinophils Relative: 0 %
HCT: 36.6 % — ABNORMAL LOW (ref 39.0–52.0)
Hemoglobin: 11.2 g/dL — ABNORMAL LOW (ref 13.0–17.0)
Immature Granulocytes: 1 %
Lymphocytes Relative: 4 %
Lymphs Abs: 0.7 10*3/uL (ref 0.7–4.0)
MCH: 24.3 pg — ABNORMAL LOW (ref 26.0–34.0)
MCHC: 30.6 g/dL (ref 30.0–36.0)
MCV: 79.6 fL — ABNORMAL LOW (ref 80.0–100.0)
Monocytes Absolute: 1.5 10*3/uL — ABNORMAL HIGH (ref 0.1–1.0)
Monocytes Relative: 9 %
Neutro Abs: 14.1 10*3/uL — ABNORMAL HIGH (ref 1.7–7.7)
Neutrophils Relative %: 86 %
Platelets: 300 10*3/uL (ref 150–400)
RBC: 4.6 MIL/uL (ref 4.22–5.81)
RDW: 16.8 % — ABNORMAL HIGH (ref 11.5–15.5)
WBC: 16.5 10*3/uL — ABNORMAL HIGH (ref 4.0–10.5)
nRBC: 0 % (ref 0.0–0.2)

## 2020-10-05 LAB — HEPARIN LEVEL (UNFRACTIONATED): Heparin Unfractionated: 0.35 IU/mL (ref 0.30–0.70)

## 2020-10-05 LAB — COMPREHENSIVE METABOLIC PANEL
ALT: 38 U/L (ref 0–44)
AST: 46 U/L — ABNORMAL HIGH (ref 15–41)
Albumin: 1.9 g/dL — ABNORMAL LOW (ref 3.5–5.0)
Alkaline Phosphatase: 149 U/L — ABNORMAL HIGH (ref 38–126)
Anion gap: 7 (ref 5–15)
BUN: 21 mg/dL — ABNORMAL HIGH (ref 6–20)
CO2: 28 mmol/L (ref 22–32)
Calcium: 8.9 mg/dL (ref 8.9–10.3)
Chloride: 101 mmol/L (ref 98–111)
Creatinine, Ser: 1.39 mg/dL — ABNORMAL HIGH (ref 0.61–1.24)
GFR, Estimated: 59 mL/min — ABNORMAL LOW (ref >60)
Glucose, Bld: 146 mg/dL — ABNORMAL HIGH (ref 70–99)
Potassium: 5.1 mmol/L (ref 3.5–5.1)
Sodium: 136 mmol/L (ref 135–145)
Total Bilirubin: 0.4 mg/dL (ref 0.3–1.2)
Total Protein: 5.1 g/dL — ABNORMAL LOW (ref 6.5–8.1)

## 2020-10-05 LAB — PHOSPHORUS: Phosphorus: 4.2 mg/dL (ref 2.5–4.6)

## 2020-10-05 LAB — MAGNESIUM: Magnesium: 2.2 mg/dL (ref 1.7–2.4)

## 2020-10-05 MED ORDER — ENOXAPARIN SODIUM 40 MG/0.4ML ~~LOC~~ SOLN
40.0000 mg | Freq: Every day | SUBCUTANEOUS | Status: DC
  Administered 2020-10-05 – 2020-10-06 (×2): 40 mg via SUBCUTANEOUS
  Filled 2020-10-05 (×2): qty 0.4

## 2020-10-05 MED ORDER — LEVALBUTEROL HCL 1.25 MG/0.5ML IN NEBU
1.2500 mg | INHALATION_SOLUTION | Freq: Three times a day (TID) | RESPIRATORY_TRACT | Status: DC
  Administered 2020-10-06: 1.25 mg via RESPIRATORY_TRACT
  Filled 2020-10-05: qty 0.5

## 2020-10-05 MED ORDER — LEVALBUTEROL HCL 0.63 MG/3ML IN NEBU: 0.6300 mg | INHALATION_SOLUTION | Freq: Four times a day (QID) | RESPIRATORY_TRACT | Status: DC | PRN

## 2020-10-05 MED ORDER — AMOXICILLIN 500 MG PO CAPS
500.0000 mg | ORAL_CAPSULE | Freq: Three times a day (TID) | ORAL | Status: DC
  Administered 2020-10-05 – 2020-10-07 (×8): 500 mg via ORAL
  Filled 2020-10-05 (×10): qty 1

## 2020-10-05 MED ORDER — SODIUM CHLORIDE 0.9 % IV SOLN: 2.0000 g | INTRAVENOUS | Status: DC

## 2020-10-05 NOTE — Progress Notes (Signed)
Progress Note  Patient Name: Brett Medina Date of Encounter: 10/05/2020  Primary Cardiologist: Elouise Munroe, MD   Subjective   Feels stable.  Inpatient Medications    Scheduled Meds: . amoxicillin  500 mg Oral Q8H  . aspirin EC  81 mg Oral Daily  . atorvastatin  20 mg Oral Daily  . enoxaparin (LOVENOX) injection  40 mg Subcutaneous QHS  . levalbuterol  1.25 mg Nebulization Q6H  . mouth rinse  15 mL Mouth Rinse BID  . metoprolol tartrate  25 mg Oral BID  . sodium chloride flush  3 mL Intravenous Q12H  . sodium chloride flush  3 mL Intravenous Q12H   Continuous Infusions: . sodium chloride     PRN Meds: sodium chloride, acetaminophen **OR** acetaminophen, guaiFENesin-codeine, labetalol, lidocaine (PF), lidocaine, loperamide, ondansetron **OR** ondansetron (ZOFRAN) IV, polyethylene glycol, sodium chloride flush   Vital Signs    Vitals:   10/05/20 1054 10/05/20 1228 10/05/20 1509 10/05/20 1554  BP: (!) 114/95 114/85  (!) 134/94  Pulse: 99 99  (!) 109  Resp: 20 20  19   Temp: 98 F (36.7 C) 98 F (36.7 C)  98.1 F (36.7 C)  TempSrc:    Oral  SpO2:   95% 94%  Weight:      Height:        Intake/Output Summary (Last 24 hours) at 10/05/2020 1612 Last data filed at 10/05/2020 0408 Gross per 24 hour  Intake 1057.79 ml  Output 600 ml  Net 457.79 ml   Filed Weights   10/03/20 0434 10/04/20 0500 10/05/20 0300  Weight: 89.9 kg 88.6 kg 90.6 kg    Telemetry    Sinus tachycardia - Personally Reviewed  ECG    No new - Personally Reviewed  Physical Exam   GEN: No acute distress.   Neck: No JVD Cardiac: regular tachycardia Respiratory: diminished on left GI: Soft, nontender, non-distended  MS: 1+ edema; No deformity. Neuro:  Nonfocal  Psych: Normal affect   Labs    Chemistry Recent Labs  Lab 10/03/20 0323 10/04/20 0154 10/05/20 0115  NA 135 133* 136  K 3.8 4.6 5.1  CL 99 102 101  CO2 23 22 28   GLUCOSE 102* 144* 146*  BUN 10 14 21*   CREATININE 1.16 1.21 1.39*  CALCIUM 8.8* 8.9 8.9  PROT 5.2* 5.6* 5.1*  ALBUMIN 2.1* 2.1* 1.9*  AST 30 40 46*  ALT 26 27 38  ALKPHOS 88 106 149*  BILITOT 0.6 0.5 0.4  GFRNONAA >60 >60 59*  ANIONGAP 13 9 7      Hematology Recent Labs  Lab 10/03/20 0323 10/04/20 0154 10/05/20 0115  WBC 10.7* 10.8* 16.5*  RBC 4.81 5.00 4.60  HGB 12.5* 12.3* 11.2*  HCT 37.2* 39.5 36.6*  MCV 77.3* 79.0* 79.6*  MCH 26.0 24.6* 24.3*  MCHC 33.6 31.1 30.6  RDW 16.9* 17.0* 16.8*  PLT 267 256 300    Cardiac EnzymesNo results for input(s): TROPONINI in the last 168 hours. No results for input(s): TROPIPOC in the last 168 hours.   BNP Recent Labs  Lab 09/29/20 1635  BNP 205.0*     DDimer No results for input(s): DDIMER in the last 168 hours.   Radiology    CARDIAC CATHETERIZATION  Result Date: 10/04/2020  Dist LAD lesion is 50% stenosed.  1st RPL lesion is 20% stenosed.  Mid RCA lesion is 25% stenosed.  2nd Diag lesion is 40% stenosed.  1) Pt with nonobstructive CAD  Moderate diffuse mid-LAD  stenosis  Moderate diffuse diagonal stenosis  Patent LCx without stenosis  Patent RCA with mild aneurysmal dilatation of the mid-vessel and aneurysmal dilatation of the PLA branch 2) Normal LVEDP Recommend: medical therapy. No severe lesions or culprit for NSTEMI - suspect demand ischemia    Cardiac Studies    Dist LAD lesion is 50% stenosed.  1st RPL lesion is 20% stenosed.  Mid RCA lesion is 25% stenosed.  2nd Diag lesion is 40% stenosed.   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 No severe lesions or culprit for NSTEMI - suspect demand ischemia   Patient Profile     59 y.o.malewithno documented past medical history but evidence of  hypertension, andtobacco smoking and no regular primary care follow-upwho presented to emergency room with 72-monthshistory of progressive worsening dyspnea on exertion, orthopnea, PND and lower extremity edema. [place] to have acute hypoxic respiratory failure and large L pleural effusionand elevated tropoinswith reduced EF.  Assessment & Plan   Principal Problem:   Large pleural effusion Active Problems:   Elevated troponin   Demand ischemia (HCC)  We performed diagnostic coronary angiography yesterday which showed nonobstructive CAD.  Troponin elevation and reduced ejection fraction likely secondary to demand ischemia in the setting of significant pulmonary illness with probable malignancy and acute hypoxic respiratory failure.  Patient is on aspirin, atorvastatin, metoprolol.  His cardiovascular risk at this point is likely nonmodifiable.  He is intermediate risk for MACE for planned bronchoscopy.  I have described this risk in detail for the patient and his wife who is at the bedside, they understand this risk stratification and agreed to proceed with pulmonary testing as needed.  No further cardiovascular testing is required at this time, however cardiology will follow along as needed given mildly reduced ejection fraction.  When able, medication therapy can be titrated for heart failure.     For questions or updates, please contact [place] Please consult [email or web address] for contact info under        Signed, Elouise Munroe, MD  10/05/2020, 4:12 PM

## 2020-10-05 NOTE — Progress Notes (Addendum)
NAME:  Brett Medina, MRN:  [id number], DOB:  January 24, 1961, LOS: 6 ADMISSION DATE:  09/29/2020, CONSULTATION DATE:  10/01/2020 REFERRING MD:  Dr. Nevada Crane , CHIEF COMPLAINT:  Pleural Effusion  Brief History   59 year old male with hx of tobacco abuse presenting with progressive shortness of breath with CT findings concerning for metastatic lung process with large left pleural effusion.    History of present illness    59 year old male with no significant past medical history other than tobacco abuse with a 68 year pack history (reports smoking for 45 years, 1.5 ppd) in which he quit smoking 2 months ago.  He does not see a physician on a regular basis.  Reports chronic bilateral lower leg swelling, as it runs in his family.    Presents with progressive two month history of progressive dyspnea.  Started out in September as dyspnea on exertion which progressed to dsypnea even at rest.  Reports chronic unchanged productive cough- with clear sputum.  Also reports 15lb weight loss over the last month.  He was seen at urgent care several weeks ago and prescribed prednisone, which did not help.  On admit, he was tachycardic, mildly tachypneic, and room air saturations of 91-97%.  Workup notable for WBC 13.2, troponin 1087->1380-> 1446-> 746, BNP 205, SARS2 negative, moderate Hgb on UA.  CXR showed near complete consolidation of left hemithorax with combination of pleural effusion and consolidation of left lung, with peripheral nodular opacity of right mid lung, all concerning for malignancy.  He was cultured and started on empiric antibiotics.  He was admitted to [facility] for further workup.    Cardiology consulted and are following given elevated troponin's and started on heparin gtt.  TTE showing LVEF 40-45% with global hypokinesis.  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  He underwent a left thoracentesis in IR on 11/29 with 950 ml fluid removed and sent for cytology and pleural studies.  Reports some improvement in SOB since thoracentesis.   Repeat CXR today, shows increased size in left pleural effusion.  Patient reports no worsening of SOB today.  Pulmonary consulted for further management and evaluation of pleural effusion and pulmonary nodules.   Past Medical History  Tobacco abuse   Significant Hospital Events   11/28 Admitted [facility] 11/29 left thoracentesis/ cards consult  Consults:  IR Cardiology  Pulmonary  Procedures:  11/29 Left thoracentesis IR-> 950 ml of dark brown fluid  Significant Diagnostic Tests:  11/28 CT chest wo contrast >> 1. Constellation of findings consistent with metastatic malignancy. 2. Moderate to large volume left pleural effusion with almost complete collapse of the left upper and lower lobes. Associated mediastinal lymphadenopathy and limited evaluation for hilar lymphadenopathy on this noncontrast study. Underlying malignancy suspected (ddx includes metastasis or primary lung). Consider analysis of the pleural fluid for further evaluation. 3. Indeterminate multifocal right lung pulmonary nodules measuring up to 1 cm in the right middle lobe. 4. Indeterminate 3.2 cm peribronchovascular ground-glass airspace opacity within the right lower lobe. 5. Scattered suspicious sclerotic lesions concerning for osseous metastases. 6. Indeterminate pericentimeter subcutaneus soft tissue densities within the bilateral anterolateral back at the level of scapulas. 7. Severe emphysematous changes. 8. Ascending aorta aneurysm measuring  up to 4.5 cm. 9. Aortic Atherosclerosis (ICD10-I70.0) and Emphysema  TTE  11/29 >> 1. Left ventricular ejection fraction, by estimation, is 40 to 45%. The  left ventricle has mildly decreased function. The left ventricle  demonstrates global hypokinesis. Left ventricular diastolic parameters are  indeterminate.  2. Right ventricular systolic function is normal. The right ventricular  size is normal. Tricuspid regurgitation signal is inadequate for assessing  PA pressure.  3. The mitral valve is normal in structure. No evidence of mitral valve  regurgitation. No evidence of mitral stenosis.  4. The aortic valve was not well visualized. Aortic valve regurgitation  is trivial. No aortic stenosis is present.  5. Aortic dilatation noted. 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  6. The inferior vena cava is dilated in size with <50% respiratory  variability, suggesting right atrial pressure of 15 mmHg.    Micro Data:  11/28 SARS 2/ flu >>neg 11/28 BCx2 >> ngtd 11/28 UC >> 11/29 left pleural studies: GS >> NGTD          AFB >> NGTD                     Culture >> NGTD                                 Cytology >> pending           Antimicrobials:  11/28 azithro >> 11/28 ceftriaxone >>  Interim history/subjective:   Spoke with patient and patient's wife at bedside today.  Discussed awaiting pleural fluid cytology.  Overall he is doing well glad to hear about his recent catheterization report.  From respiratory standpoint he remains slightly short of breath but otherwise okay.  Patient denies hemoptysis.  Objective   Blood pressure (!) 114/95, pulse (!) 112, temperature 97.9 F (36.6 C), temperature source Axillary, resp. rate 20, height 5\' 9"  (1.753 m), weight 90.6 kg, SpO2 96 %.        Intake/Output Summary (Last 24 hours) at 10/05/2020 1109 Last data filed at 10/05/2020 0408 Gross per 24 hour  Intake 1260.22 ml  Output 1000 ml  Net 260.22 ml   Filed Weights   10/03/20 0434 10/04/20 0500 10/05/20 0300  Weight: 89.9 kg  88.6 kg 90.6 kg   Physical Exam: General: Elderly male resting in bed HENT: NCAT, mucous membranes moist Eyes: Tracking appropriately Respiratory: Diminished breath sounds in the left upper in comparison to the right, no crackles Cardiovascular: Regular rate rhythm, S1-S2 GI: Soft nontender nondistended Extremities: Bilateral dependent lower extremity edema  Resolved Hospital Problem list    Assessment & Plan:   Acute hypoxic respiratory failure secondary to lung mass with atelectasis, pleural effusion Emphysematous changes on CT - Lower extremity doppler negative.  RV reassuring on TTE, doubt acute PE; given contrast dye allergy, CTA not performed - continue supplemental O2 for sat goal > 88% - unable to determine pleural fluid by Lights criteria, but primarily exudative by pleural studies, LDH 4038, protein 3.4 - Cytology still pending Plan: Cytology is still pending from thoras I do suspect he will still need bronchoscopy if the cell block is insufficient for molecular   This is a very large T4 lesion and potentially will need chemo plus XRT, potential immunotherapy. Recently having an elevated troponin and demand ischemia. We will need to get clearance from cardiology to move forward with anesthesia to  have bronchoscopy. Potentially plan for this on Tuesday  Dr. Lamonte Sakai to see on Monday   Systolic HF Elevated troponin, s/p LHC, likely demand ischemia  Left heart catheterization with nonobstructive CAD. Tachycardia - per Cardiology- ASA, statin, BB; - TTE w/ LVEF 40-45% with global hypokinesis Plan: Continue heparin per cardiology. This will need to be held prior to bronchoscopy.   Best practice   Diet: heart healthy Pain/Anxiety/Delirium protocol (if indicated): n/a VAP protocol (if indicated): n/a DVT prophylaxis: heparin gtt GI prophylaxis: n/a Glucose control: n/a Mobility: as tolerated Code Status: full  Disposition: telemetry  Labs   CBC: Recent Labs  Lab  09/29/20 1635 09/30/20 0213 10/01/20 0154 10/02/20 0135 10/03/20 0323 10/04/20 0154 10/05/20 0115  WBC 13.2*   < > 10.7* 11.5* 10.7* 10.8* 16.5*  NEUTROABS 10.7*  --   --   --  8.6* 10.2* 14.1*  HGB 14.1   < > 13.0 12.4* 12.5* 12.3* 11.2*  HCT 44.2   < > 42.2 39.9 37.2* 39.5 36.6*  MCV 78.6*   < > 79.5* 79.8* 77.3* 79.0* 79.6*  PLT 306   < > 266 276 267 256 300   < > = values in this interval not displayed.    Basic Metabolic Panel: Recent Labs  Lab 10/01/20 0130 10/02/20 0135 10/03/20 0323 10/04/20 0154 10/05/20 0115  NA 133* 135 135 133* 136  K 4.3 4.5 3.8 4.6 5.1  CL 99 98 99 102 101  CO2 28 26 23 22 28   GLUCOSE 94 102* 102* 144* 146*  BUN 13 14 10 14  21*  CREATININE 1.36* 1.32* 1.16 1.21 1.39*  CALCIUM 8.7* 9.0 8.8* 8.9 8.9  MG  --  1.8 1.6* 2.0 2.2  PHOS  --   --  3.7 4.3 4.2   GFR: Estimated Creatinine Clearance: 64.5 mL/min (A) (by C-G formula based on SCr of 1.39 mg/dL (H)). Recent Labs  Lab 09/29/20 1635 09/30/20 0213 10/02/20 0135 10/03/20 0323 10/04/20 0154 10/05/20 0115  PROCALCITON  --   --  0.25  --   --   --   WBC 13.2*   < > 11.5* 10.7* 10.8* 16.5*  LATICACIDVEN 1.3  --   --   --   --   --    < > = values in this interval not displayed.    Liver Function Tests: Recent Labs  Lab 09/29/20 1635 10/03/20 0323 10/04/20 0154 10/05/20 0115  AST 33 30 40 46*  ALT 29 26 27  38  ALKPHOS 90 88 106 149*  BILITOT 0.6 0.6 0.5 0.4  PROT 6.3* 5.2* 5.6* 5.1*  ALBUMIN 2.7* 2.1* 2.1* 1.9*   No results for input(s): LIPASE, AMYLASE in the last 168 hours. No results for input(s): AMMONIA in the last 168 hours.  ABG No results found for: PHART, PCO2ART, PO2ART, HCO3, TCO2, ACIDBASEDEF, O2SAT   Coagulation Profile: Recent Labs  Lab 09/29/20 1635  INR 1.2    Cardiac Enzymes: No results for input(s): CKTOTAL, CKMB, CKMBINDEX, TROPONINI in the last 168 hours.  HbA1C: Hgb A1c MFr Bld  Date/Time Value Ref Range Status  09/30/2020 02:13 AM 5.7  (H) 4.8 - 5.6 % Final    Comment:    (NOTE) Pre diabetes:          5.7%-6.4%  Diabetes:              >6.4%  Glycemic control for   <7.0% adults with diabetes     CBG: Recent Labs  Lab 10/01/20  0118 10/01/20 [facility] 10/05/2020 11:09 AM

## 2020-10-05 NOTE — Progress Notes (Signed)
PROGRESS NOTE    Brett Medina  ZOX:[id number] DOB: November 25, 1960 DOA: 09/29/2020 PCP: Patient, No Pcp Per     Brief Narrative:  Brett Green [name] a 59 y.o.WM PMHx Tobacco abuse.   Presented to the ED with complaints of difficulty breathing ongoing over the past 70months at least. Reports he was seen at urgent care several x40 was given prednisone. Patient quit smoking cigarettes about 2 months ago. He denies chest pain. Has chronic unchanged bilateral lower extremity swelling. Reports a productive cough over the past few weeks. No family history of heart attacks.  ED Course:O2 sats 91 to 97% on room air. Tachycardic to 135, mild tachypnea to 22.Lactic acid 1.3. WBC 13.2. Troponin 1087. UA with moderate hemoglobin. Chest CT-Constellation of findings consistent with metastatic malignancy, moderate to large volume left pleural effusion with almost complete collapse of left upper and lower lobes(see detailed report).Patient reports contrast allergy, he refused CT with contrast to rule out PE as he wastachycardic. Ceftriaxone and doxycycline given for possible pneumonia, then discontinued. Heparin drip started.Hospitalist admit for large left pleural effusion.  Cardiology was consulted for elevated troponin, following.  Post left thoracentesis on 09/30/20 by IR 950 cc of dark bloody fluid removed.    2D echo 11/29 showing reduced LVEF 40-45% with global left ventricle hypokinesis.  B/L LE duplex US negative for DVT.  10/01/20: Seen and examined with his wife at bedside.  Noted persistent non productive cough on exam.  CXR independently reviewed showing recurrent large left pleural effusion.  IR consult for repeat left thoracentesis.  Pulmonary consulted to assist with the management.    Subjective: 12/4 afebrile overnight A/O x4, sitting in bed comfortably   Assessment & Plan: Covid vaccination;   Principal Problem:   Large pleural effusion Active Problems:    Elevated troponin   Demand ischemia (HCC)  Acute respiratory failure with hypoxia -Multifactorial acute CHF, neoplasm?,  AKI -11/29 s/p LEFT thoracentesis aspirated 950 cc fluid.  Awaiting cytology -PCXR 11/30 showed continue LEFT pleural effusion see results below -Titrate O2 to maintain SPO2> 92%, secondary to acute CHF -Incentive spirometry -Flutter valve -Xopenex QID   Elevated troponin, suspect Demand ischemia; no lesions or culprit for NSTEMI Results for Brett Medina, Brett Medina (MRN [id number]) as of 10/03/2020 06:45  Ref. Range 09/29/2020 16:35 09/29/2020 20:32 09/30/2020 02:13 10/01/2020 09:17  Troponin I (High Sensitivity) Latest Ref Range: <18 ng/L 1,087 (HH) 1,380 (HH) 1,446 (HH) 746 (HH)  -Patient denies any chest pain or any anginal symptoms. -No evidence of acute ischemia on twelve-lead EKG -ASA 81 mg daily  -Heparin drip; had declined CTA chest to rule out PE due to history of allergy to IV contrast even with pretreatment. -LDL 85, started  Atorvastatin 20 mg daily  -Cardiology following  Acute systolic CHF -Presented with elevated BNP, chronic B/L LE edema -LVEF 40-45% on 2D echo 11/29 -Start strict I&O and daily weight -Labetalol PRN -Metoprolol 25 mg BID  Persistent sinus tachycardia -TSH nornal -Continue IV heparin for now,his large pleural effusion likely driving his tachycardia,but if persistent tachycardia may need to rule out PE.(Currently refusing CT with contrast due to allergy reaction,even with premedication). -B/L LE duplex US negative for DVT  Large left pleural effusion, unclear etiology. -Most likely secondary to malignancy -12/1 repeat CT chest -12/1 CT abdomen and pelvis; cancer work-up -12/1 CT head; cancer work-up -12/1 prior to transport to radiology administer Ativan -12/1 guaifenesin+ codeine for refractory cough -After results of cytology come in from thoracentesis PCCM most likely  will proceed with EBUS to obtain tissue sample. -12/6  EBUS pending   Leukocytosis, downtrending, likely reactive in the setting of acute hypoxic respiratory failure with large left pleural effusion. Presented with WBC 13.2K, downtrending to 10.8K Currently not on antibiotics Continue to monitor fever curve and WBC  Chronic bilateral lower extremity edema -Elevate lower extremities  AKI Lab Results  Component Value Date   CREATININE 1.39 (H) 10/05/2020   CREATININE 1.21 10/04/2020   CREATININE 1.16 10/03/2020   CREATININE 1.32 (H) 10/02/2020   CREATININE 1.36 (H) 10/01/2020  -Resolved  UTI positive Enterococcus faecalis -12/4 begin antibiotics     DVT prophylaxis: Heparin drip Code Status: Full Family Communication: 12/4 Wife at bedside for discussion of plan of care answered all questions Status is: Inpatient    Dispo: The patient is from: Home              Anticipated d/c is to: Home              Anticipated d/c date is:??              Patient currently unstable      Consultants:  PCCM Cardiology IR    Procedures/Significant Events:  11/30  LEFT thoracentesis aspirated 950 cc fluid 11/30 PCXR;Increased size of large left pleural effusion with decreased left upper lung aeration. 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  VENTILATOR SETTINGS: Nasal cannula 12/4 Flow; 4 L/min SPO2; 97%   Cultures 11/28 urine positive Enterococcus faecalis 11/29 LEFT thoracentesis NGTD 12/1 LEFT thoracentesis fluid pending    Antimicrobials: Anti-infectives (From  admission, onward)   Start     Ordered Stop   09/29/20 1630  cefTRIAXone (ROCEPHIN) 2 g in sodium chloride 0.9 % 100 mL IVPB  Status:  Discontinued        09/29/20 1624 09/29/20 2226   09/29/20 1630  azithromycin (ZITHROMAX) 500 mg in sodium chloride 0.9 % 250 mL IVPB  Status:  Discontinued        09/29/20 1624 09/29/20 2226       Devices    LINES / TUBES:      Continuous Infusions: . sodium chloride       Objective: Vitals:   10/05/20 0813 10/05/20 1053 10/05/20 1054 10/05/20 1228  BP:  (!) 114/95 (!) 114/95 114/85  Pulse:  (!) 112 99 99  Resp:  20 20 20   Temp:  97.9 F (36.6 C) 98 F (36.7 C) 98 F (36.7 C)  TempSrc:  Axillary    SpO2: 98% 96%    Weight:      Height:        Intake/Output Summary (Last 24 hours) at 10/05/2020 1344 Last data filed at 10/05/2020 0408 Gross per 24 hour  Intake 1260.22 ml  Output 1000 ml  Net 260.22 ml   Filed Weights   10/03/20 0434 10/04/20 0500 10/05/20 0300  Weight: 89.9 kg 88.6 kg 90.6 kg   Physical Exam:  General: A/O x4, positive acute respiratory distress Eyes: negative scleral hemorrhage, negative anisocoria, negative icterus ENT: Negative Runny nose, negative gingival bleeding, Neck:  Negative scars,  masses, torticollis, lymphadenopathy, JVD Lungs: Clear to auscultation bilaterally, bilateral inspiratory and expiratory wheeze, negative crackles Cardiovascular: Regular rate and rhythm without murmur gallop or rub normal S1 and S2 Abdomen: negative abdominal pain, nondistended, positive soft, bowel sounds, no rebound, no ascites, no appreciable mass Extremities: No significant cyanosis, clubbing, or edema bilateral lower extremities Skin: Negative rashes, lesions, ulcers Psychiatric:  Negative depression, negative anxiety, negative fatigue, negative mania  Central nervous system:  Cranial nerves II through XII intact, tongue/uvula midline, all extremities muscle strength 5/5, sensation intact throughout, negative  dysarthria, negative expressive aphasia, negative receptive aphasia.  .     Data Reviewed: Care during the described time interval was provided by me .  I have reviewed this patient's available data, including medical history, events of note, physical examination, and all test results as part of my evaluation.  CBC: Recent Labs  Lab 09/29/20 1635 09/30/20 0213 10/01/20 0154 10/02/20 0135 10/03/20 0323 10/04/20 0154 10/05/20 0115  WBC 13.2*   < > 10.7* 11.5* 10.7* 10.8* 16.5*  NEUTROABS 10.7*  --   --   --  8.6* 10.2* 14.1*  HGB 14.1   < > 13.0 12.4* 12.5* 12.3* 11.2*  HCT 44.2   < > 42.2 39.9 37.2* 39.5 36.6*  MCV 78.6*   < > 79.5* 79.8* 77.3* 79.0* 79.6*  PLT 306   < > 266 276 267 256 300   < > = values in this interval not displayed.   Basic Metabolic Panel: Recent Labs  Lab 10/01/20 0130 10/02/20 0135 10/03/20 0323 10/04/20 0154 10/05/20 0115  NA 133* 135 135 133* 136  K 4.3 4.5 3.8 4.6 5.1  CL 99 98 99 102 101  CO2 28 26 23 22 28   GLUCOSE 94 102* 102* 144* 146*  BUN 13 14 10 14  21*  CREATININE 1.36* 1.32* 1.16 1.21 1.39*  CALCIUM 8.7* 9.0 8.8* 8.9 8.9  MG  --  1.8 1.6* 2.0 2.2  PHOS  --   --  3.7 4.3 4.2   GFR: Estimated Creatinine Clearance: 64.5 mL/min (A) (by C-G formula based on SCr of 1.39 mg/dL (H)). Liver Function Tests: Recent Labs  Lab 09/29/20 1635 10/03/20 0323 10/04/20 0154 10/05/20 0115  AST 33 30 40 46*  ALT 29 26 27  38  ALKPHOS 90 88 106 149*  BILITOT 0.6 0.6 0.5 0.4  PROT 6.3* 5.2* 5.6* 5.1*  ALBUMIN 2.7* 2.1* 2.1* 1.9*   No results for input(s): LIPASE, AMYLASE in the last 168 hours. No results for input(s): AMMONIA in the last 168 hours. Coagulation Profile: Recent Labs  Lab 09/29/20 1635  INR 1.2   Cardiac Enzymes: No results for input(s): CKTOTAL, CKMB, CKMBINDEX, TROPONINI in the last 168 hours. BNP (last 3 results) No results for input(s): PROBNP in the last 8760 hours. HbA1C: No results for input(s): HGBA1C in the  last 72 hours. CBG: Recent Labs  Lab 10/01/20 0118 10/01/20 0638  GLUCAP 87 98   Lipid Profile: No results for input(s): CHOL, HDL, LDLCALC, TRIG, CHOLHDL, LDLDIRECT in the last 72 hours. Thyroid Function Tests: No results for input(s): TSH, T4TOTAL, FREET4, T3FREE, THYROIDAB in the last 72 hours. Anemia Panel: No results for input(s): VITAMINB12, FOLATE, FERRITIN, TIBC, IRON, RETICCTPCT in the last 72 hours. Sepsis Labs: Recent Labs  Lab 09/29/20 1635 10/02/20 0135  PROCALCITON  --  0.25  LATICACIDVEN 1.3  --     Recent Results (from the past 240 hour(s))  Blood Culture (routine x 2)  Status: None   Collection Time: 09/29/20  4:35 PM   Specimen: BLOOD LEFT HAND  Result Value Ref Range Status   Specimen Description BLOOD LEFT HAND  Final   Special Requests   Final    BOTTLES DRAWN AEROBIC AND ANAEROBIC Blood Culture adequate volume   Culture   Final    NO GROWTH 5 DAYS Performed at [facility], [address], [place], [place] [address]    Report Status 10/04/2020 FINAL  Final  Blood Culture (routine x 2)     Status: None   Collection Time: 09/29/20  4:45 PM   Specimen: BLOOD RIGHT HAND  Result Value Ref Range Status   Specimen Description BLOOD RIGHT HAND  Final   Special Requests   Final    BOTTLES DRAWN AEROBIC AND ANAEROBIC Blood Culture results may not be optimal due to an inadequate volume of blood received in culture bottles   Culture   Final    NO GROWTH 5 DAYS Performed at [facility], [address] [place], [place] [address]    Report Status 10/04/2020 FINAL  Final  Resp Panel by RT-PCR (Flu A&B, Covid) Nasopharyngeal Swab     Status: None   Collection Time: 09/29/20  5:07 PM   Specimen: Nasopharyngeal Swab; Nasopharyngeal(NP) swabs in vial transport medium  Result Value Ref Range Status   SARS Coronavirus 2 by RT PCR NEGATIVE NEGATIVE Final    Comment: (NOTE) SARS-CoV-2 target nucleic acids are NOT DETECTED.  The SARS-CoV-2 RNA is generally  detectable in upper respiratory specimens during the acute phase of infection. The lowest concentration of SARS-CoV-2 viral copies this assay can detect is 138 copies/mL. A negative result does not preclude SARS-Cov-2 infection and should not be used as the sole basis for treatment or other patient management decisions. A negative result may occur with  improper specimen collection/handling, submission of specimen other than nasopharyngeal swab, presence of viral mutation(s) within the areas targeted by this assay, and inadequate number of viral copies(<138 copies/mL). A negative result must be combined with clinical observations, patient history, and epidemiological information. The expected result is Negative.  Fact Sheet for Patients:  EntrepreneurPulse.com.au  Fact Sheet for Healthcare Providers:  IncredibleEmployment.be  This test is no t yet approved or cleared by the [place] FDA and  has been authorized for detection and/or diagnosis of SARS-CoV-2 by FDA under an Emergency Use Authorization (EUA). This EUA will remain  in effect (meaning this test can be used) for the duration of the COVID-19 declaration under Section 564(b)(1) of the Act, 21 U.S.C.section 360bbb-3(b)(1), unless the authorization is terminated  or revoked sooner.       Influenza A by PCR NEGATIVE NEGATIVE Final   Influenza B by PCR NEGATIVE NEGATIVE Final    Comment: (NOTE) The Xpert Xpress SARS-CoV-2/FLU/RSV plus assay is intended as an aid in the diagnosis of influenza from Nasopharyngeal swab specimens and should not be used as a sole basis for treatment. Nasal washings and aspirates are unacceptable for Xpert Xpress SARS-CoV-2/FLU/RSV testing.  Fact Sheet for Patients: EntrepreneurPulse.com.au  Fact Sheet for Healthcare Providers: IncredibleEmployment.be  This test is not yet approved or cleared by the [place] FDA  and has been authorized for detection and/or diagnosis of SARS-CoV-2 by FDA under an Emergency Use Authorization (EUA). This EUA will remain in effect (meaning this test can be used) for the duration of the COVID-19 declaration under Section 564(b)(1) of the Act, 21 U.S.C. section 360bbb-3(b)(1), unless the authorization is terminated  or revoked.  Performed at [facility], [address] [place], [place] [address]   Urine culture     Status: Abnormal   Collection Time: 09/29/20  5:51 PM   Specimen: In/Out Cath Urine  Result Value Ref Range Status   Specimen Description   Final    IN/OUT CATH URINE Performed at [facility], [address], [place], [place] [address]    Special Requests   Final    NONE Performed at [facility], [address], [place], [place] [address]    Culture 1,000 COLONIES/mL ENTEROCOCCUS FAECALIS (A)  Final   Report Status 10/03/2020 FINAL  Final   Organism ID, Bacteria ENTEROCOCCUS FAECALIS (A)  Final      Susceptibility   Enterococcus faecalis - MIC*    AMPICILLIN <=2 SENSITIVE Sensitive     NITROFURANTOIN <=16 SENSITIVE Sensitive     VANCOMYCIN 2 SENSITIVE Sensitive     * 1,000 COLONIES/mL ENTEROCOCCUS FAECALIS  Body fluid culture     Status: None   Collection Time: 09/30/20  9:38 AM   Specimen: Lung, Left; Pleural Fluid  Result Value Ref Range Status   Specimen Description PLEURAL FLUID  Final   Special Requests LEFT LUNG  Final   Gram Stain   Final    ABUNDANT WBC PRESENT,BOTH PMN AND MONONUCLEAR NO ORGANISMS SEEN    Culture   Final    NO GROWTH 3 DAYS Performed at [facility] [address], [place], [place] [address]    Report Status 10/03/2020 FINAL  Final  Acid Fast Smear (AFB)     Status: None   Collection Time: 09/30/20  9:38 AM   Specimen: Lung, Left; Pleural Fluid  Result Value Ref Range Status   AFB Specimen Processing Concentration  Final   Acid Fast Smear Negative  Final    Comment: (NOTE) Performed At: [facility] [place], [place] [id number] Rush Farmer MD TJ:[id number]    Source (AFB) PLEURAL  Final    Comment: FLUID LEFT LUNG Performed at [facility], [place] [address] [place], [place] [address]          Radiology Studies: CARDIAC CATHETERIZATION  Result Date: 10/04/2020  Dist LAD lesion is 50% stenosed.  1st RPL lesion is 20% stenosed.  Mid RCA lesion is 25% stenosed.  2nd Diag lesion is 40% stenosed.  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 No severe lesions or culprit for NSTEMI - suspect demand ischemia        Scheduled Meds: . aspirin EC  81 mg Oral Daily  . atorvastatin  20 mg Oral Daily  . enoxaparin (LOVENOX) injection  40 mg Subcutaneous QHS  . levalbuterol  1.25 mg Nebulization Q6H  . mouth rinse  15 mL Mouth Rinse BID  . metoprolol tartrate  25 mg Oral BID  . sodium chloride flush  3 mL Intravenous Q12H  . sodium chloride flush  3 mL Intravenous Q12H   Continuous Infusions: . sodium chloride       LOS: 6 days    Time spent:40 min    Brett Medina, Geraldo Docker, MD [facility] Pager [phone]  If 7PM-7AM, please contact night-coverage [email or web address] Password [email or web address] 10/05/2020, 1:44 PM

## 2020-10-06 LAB — CBC WITH DIFFERENTIAL/PLATELET
Abs Immature Granulocytes: 0.1 10*3/uL — ABNORMAL HIGH (ref 0.00–0.07)
Basophils Absolute: 0 10*3/uL (ref 0.0–0.1)
Basophils Relative: 0 %
Eosinophils Absolute: 0 10*3/uL (ref 0.0–0.5)
Eosinophils Relative: 0 %
HCT: 35.7 % — ABNORMAL LOW (ref 39.0–52.0)
Hemoglobin: 11.4 g/dL — ABNORMAL LOW (ref 13.0–17.0)
Immature Granulocytes: 1 %
Lymphocytes Relative: 5 %
Lymphs Abs: 0.5 10*3/uL — ABNORMAL LOW (ref 0.7–4.0)
MCH: 25.1 pg — ABNORMAL LOW (ref 26.0–34.0)
MCHC: 31.9 g/dL (ref 30.0–36.0)
MCV: 78.5 fL — ABNORMAL LOW (ref 80.0–100.0)
Monocytes Absolute: 1.3 10*3/uL — ABNORMAL HIGH (ref 0.1–1.0)
Monocytes Relative: 12 %
Neutro Abs: 9.1 10*3/uL — ABNORMAL HIGH (ref 1.7–7.7)
Neutrophils Relative %: 82 %
Platelets: 324 10*3/uL (ref 150–400)
RBC: 4.55 MIL/uL (ref 4.22–5.81)
RDW: 17 % — ABNORMAL HIGH (ref 11.5–15.5)
WBC: 11.1 10*3/uL — ABNORMAL HIGH (ref 4.0–10.5)
nRBC: 0 % (ref 0.0–0.2)

## 2020-10-06 LAB — PHOSPHORUS: Phosphorus: 4.1 mg/dL (ref 2.5–4.6)

## 2020-10-06 LAB — COMPREHENSIVE METABOLIC PANEL
ALT: 55 U/L — ABNORMAL HIGH (ref 0–44)
AST: 53 U/L — ABNORMAL HIGH (ref 15–41)
Albumin: 2.1 g/dL — ABNORMAL LOW (ref 3.5–5.0)
Alkaline Phosphatase: 175 U/L — ABNORMAL HIGH (ref 38–126)
Anion gap: 9 (ref 5–15)
BUN: 21 mg/dL — ABNORMAL HIGH (ref 6–20)
CO2: 24 mmol/L (ref 22–32)
Calcium: 8.7 mg/dL — ABNORMAL LOW (ref 8.9–10.3)
Chloride: 101 mmol/L (ref 98–111)
Creatinine, Ser: 1.26 mg/dL — ABNORMAL HIGH (ref 0.61–1.24)
GFR, Estimated: >60 mL/min (ref >60)
Glucose, Bld: 119 mg/dL — ABNORMAL HIGH (ref 70–99)
Potassium: 4.1 mmol/L (ref 3.5–5.1)
Sodium: 134 mmol/L — ABNORMAL LOW (ref 135–145)
Total Bilirubin: 0.6 mg/dL (ref 0.3–1.2)
Total Protein: 5.5 g/dL — ABNORMAL LOW (ref 6.5–8.1)

## 2020-10-06 LAB — MAGNESIUM: Magnesium: 1.8 mg/dL (ref 1.7–2.4)

## 2020-10-06 NOTE — Progress Notes (Signed)
PROGRESS NOTE    Brett Medina  FHL:[id number] DOB: [birth year] DOA: 09/29/2020 PCP: Patient, No Pcp Per     Brief Narrative:  Brett Green [name] a 59 y.o.WM PMHx Tobacco abuse.   Presented to the ED with complaints of difficulty breathing ongoing over the past 60months at least. Reports he was seen at urgent care several x40 was given prednisone. Patient quit smoking cigarettes about 2 months ago. He denies chest pain. Has chronic unchanged bilateral lower extremity swelling. Reports a productive cough over the past few weeks. No family history of heart attacks.  ED Course:O2 sats 91 to 97% on room air. Tachycardic to 135, mild tachypnea to 22.Lactic acid 1.3. WBC 13.2. Troponin 1087. UA with moderate hemoglobin. Chest CT-Constellation of findings consistent with metastatic malignancy, moderate to large volume left pleural effusion with almost complete collapse of left upper and lower lobes(see detailed report).Patient reports contrast allergy, he refused CT with contrast to rule out PE as he wastachycardic. Ceftriaxone and doxycycline given for possible pneumonia, then discontinued. Heparin drip started.Hospitalist admit for large left pleural effusion.  Cardiology was consulted for elevated troponin, following.  Post left thoracentesis on 09/30/20 by IR 950 cc of dark bloody fluid removed.    2D echo 11/29 showing reduced LVEF 40-45% with global left ventricle hypokinesis.  B/L LE duplex US negative for DVT.  10/01/20: Seen and examined with his wife at bedside.  Noted persistent non productive cough on exam.  CXR independently reviewed showing recurrent large left pleural effusion.  IR consult for repeat left thoracentesis.  Pulmonary consulted to assist with the management.    Subjective: 12/5 afebrile overnight/O x4, sitting in bed comfortably.  Patient's only complaint is that he does not like heart healthy diet.  Some nausea with medication because he  refuses to eat food prior to taking medication.   Assessment & Plan: Covid vaccination;   Principal Problem:   Large pleural effusion Active Problems:   Elevated troponin   Demand ischemia ([place])  Acute respiratory failure with hypoxia -Multifactorial acute CHF, neoplasm?,  AKI -11/29 s/p LEFT thoracentesis aspirated 950 cc fluid.  Awaiting cytology -PCXR 11/30 showed continue LEFT pleural effusion see results below -Titrate O2 to maintain SPO2> 92%, secondary to acute CHF -Incentive spirometry -Flutter valve -Xopenex QID   Elevated troponin, suspect Demand ischemia; no lesions or culprit for NSTEMI Results for Brett Medina, Brett Medina (MRN [id number]) as of 10/03/2020 06:45  Ref. Range 09/29/2020 16:35 09/29/2020 20:32 09/30/2020 02:13 10/01/2020 09:17  Troponin I (High Sensitivity) Latest Ref Range: <18 ng/L 1,087 (HH) 1,380 (HH) 1,446 (HH) 746 (HH)  -Patient denies any chest pain or any anginal symptoms. -No evidence of acute ischemia on twelve-lead EKG -ASA 81 mg daily  -Heparin drip; had declined CTA chest to rule out PE due to history of allergy to IV contrast even with pretreatment. -LDL 85, started  Atorvastatin 20 mg daily  -Cardiology following  Acute systolic CHF -Presented with elevated BNP, chronic B/L LE edema -LVEF 40-45% on 2D echo 11/29 -Start strict I&O -892.2 ml -Daily weight Filed Weights   10/04/20 0500 10/05/20 0300 10/06/20 0242  Weight: 88.6 kg 90.6 kg 90.5 kg  -Labetalol PRN -Metoprolol 25 mg BID  Persistent sinus tachycardia -TSH nornal -Continue IV heparin for now,his large pleural effusion likely driving his tachycardia,but if persistent tachycardia may need to rule out PE.(Currently refusing CT with contrast due to allergy reaction,even with premedication). -B/L LE duplex US negative for DVT  Large left pleural effusion, unclear  etiology. -Most likely secondary to malignancy -12/1 repeat CT chest -12/1 CT abdomen and pelvis; cancer  work-up -12/1 CT head; cancer work-up -12/1 prior to transport to radiology administer Ativan -12/1 guaifenesin+ codeine for refractory cough -After results of cytology come in from thoracentesis PCCM most likely will proceed with EBUS to obtain tissue sample. -12/6 EBUS pending   Leukocytosis, downtrending, likely reactive in the setting of acute hypoxic respiratory failure with large left pleural effusion. Presented with WBC 13.2K, downtrending to 10.8K Currently not on antibiotics Continue to monitor fever curve and WBC  Chronic bilateral lower extremity edema -Elevate lower extremities  AKI Lab Results  Component Value Date   CREATININE 1.26 (H) 10/06/2020   CREATININE 1.39 (H) 10/05/2020   CREATININE 1.21 10/04/2020   CREATININE 1.16 10/03/2020   CREATININE 1.32 (H) 10/02/2020  -Resolved  UTI positive Enterococcus faecalis -12/4 begin antibiotics     DVT prophylaxis: Heparin drip Code Status: Full Family Communication: 12/5 Wife at bedside for discussion of plan of care answered all questions Status is: Inpatient    Dispo: The patient is from: Home              Anticipated d/c is to: Home              Anticipated d/c date is:??              Patient currently unstable      Consultants:  PCCM Cardiology IR    Procedures/Significant Events:  11/30  LEFT thoracentesis aspirated 950 cc fluid 11/30 PCXR;Increased size of large left pleural effusion with decreased left upper lung aeration. 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  VENTILATOR SETTINGS: Nasal cannula 12/5 Flow; 4 L/min SPO2; 100%   Cultures 11/28 urine positive Enterococcus faecalis 11/29 LEFT thoracentesis NGTD 12/1 LEFT thoracentesis fluid pending    Antimicrobials: Anti-infectives (From admission, onward)   Start     Ordered Stop   10/05/20 1615  amoxicillin (AMOXIL) capsule 500 mg        10/05/20 1528     10/05/20 1445  cefTRIAXone (ROCEPHIN) 2 g in sodium chloride 0.9 % 100 mL IVPB  Status:  Discontinued        10/05/20 1354 10/05/20 1527   09/29/20 1630  cefTRIAXone (ROCEPHIN) 2 g in sodium chloride 0.9 % 100 mL IVPB  Status:  Discontinued        09/29/20 1624 09/29/20 2226   09/29/20 1630  azithromycin (ZITHROMAX) 500 mg in sodium chloride 0.9 % 250 mL IVPB  Status:  Discontinued        09/29/20 1624 09/29/20 2226      Devices    LINES / TUBES:      Continuous Infusions: . sodium chloride       Objective: Vitals:   10/05/20 2309 10/06/20 0242 10/06/20 0729 10/06/20 0817  BP: (!) 126/103 (!) 132/96 (!) 128/95   Pulse:  100 (!) 113   Resp:  16 19   Temp: 98.7 F (37.1 C) 98.7 F (37.1 C) 97.8 F (36.6 C)   TempSrc: Oral Oral Oral   SpO2:  97% 98% 99% 100%  Weight:  90.5 kg    Height:        Intake/Output Summary (Last 24 hours) at 10/06/2020 1027 Last data filed at 10/06/2020 0900 Gross per 24 hour  Intake --  Output 600 ml  Net -600 ml   Filed Weights   10/04/20 0500 10/05/20 0300 10/06/20 0242  Weight: 88.6 kg 90.6 kg 90.5 kg   Physical Exam:  General: A/O x4, positive acute respiratory distress Eyes: negative scleral hemorrhage, negative anisocoria, negative icterus ENT: Negative Runny nose, negative gingival bleeding, Neck:  Negative scars, masses, torticollis, lymphadenopathy, JVD Lungs: Clear to auscultation bilaterally, bilateral inspiratory and expiratory wheeze, negative crackles Cardiovascular: Regular rate and rhythm without murmur gallop or rub normal S1 and  S2 Abdomen: negative abdominal pain, nondistended, positive soft, bowel sounds, no rebound, no ascites, no appreciable mass Extremities: No significant cyanosis, clubbing, or edema bilateral lower extremities Skin: Negative rashes, lesions, ulcers Psychiatric:  Negative depression, negative anxiety, negative fatigue, negative mania  Central nervous system:  Cranial nerves II through XII intact, tongue/uvula midline, all extremities muscle strength 5/5, sensation intact throughout, negative dysarthria, negative expressive aphasia, negative receptive aphasia.  .     Data Reviewed: Care during the described time interval was provided by me .  I have reviewed this patient's available data, including medical history, events of note, physical examination, and all test results as part of my evaluation.  CBC: Recent Labs  Lab 09/29/20 1635 09/30/20 0213 10/02/20 0135 10/03/20 0323 10/04/20 0154 10/05/20 0115 10/06/20 0206  WBC 13.2*   < > 11.5* 10.7* 10.8* 16.5* 11.1*  NEUTROABS 10.7*  --   --  8.6* 10.2* 14.1* 9.1*  HGB 14.1   < > 12.4* 12.5* 12.3* 11.2* 11.4*  HCT 44.2   < > 39.9 37.2* 39.5 36.6* 35.7*  MCV 78.6*   < > 79.8* 77.3* 79.0* 79.6* 78.5*  PLT 306   < > 276 267 256 300 324   < > = values in this interval not displayed.   Basic Metabolic Panel: Recent Labs  Lab 10/02/20 0135 10/03/20 0323 10/04/20 0154 10/05/20 0115 10/06/20 0206  NA 135 135 133* 136 134*  K 4.5 3.8 4.6 5.1 4.1  CL 98 99 102 101 101  CO2 26 23 22 28 24   GLUCOSE 102* 102* 144* 146* 119*  BUN 14 10 14  21* 21*  CREATININE 1.32* 1.16 1.21 1.39* 1.26*  CALCIUM 9.0 8.8* 8.9 8.9 8.7*  MG 1.8 1.6* 2.0 2.2 1.8  PHOS  --  3.7 4.3 4.2 4.1   GFR: Estimated Creatinine Clearance: 71 mL/min (A) (by C-G formula based on SCr of 1.26 mg/dL (H)). Liver Function Tests: Recent Labs  Lab 09/29/20 1635 10/03/20 0323 10/04/20 0154 10/05/20 0115 10/06/20 0206  AST 33 30 40 46* 53*  ALT 29 26 27  38 55*  ALKPHOS  90 88 106 149* 175*  BILITOT 0.6 0.6 0.5 0.4 0.6  PROT 6.3* 5.2* 5.6* 5.1* 5.5*  ALBUMIN 2.7* 2.1* 2.1* 1.9* 2.1*   No results for input(s): LIPASE, AMYLASE in the last 168 hours. No results for input(s): AMMONIA in the last 168 hours. Coagulation Profile: Recent Labs  Lab 09/29/20 1635  INR 1.2   Cardiac Enzymes: No results for input(s): CKTOTAL, CKMB, CKMBINDEX, TROPONINI in the last 168 hours. BNP (last 3 results) No results for input(s): PROBNP in the last 8760 hours. HbA1C: No results for input(s): HGBA1C in the last 72 hours. CBG: Recent Labs  Lab  10/01/20 0118 10/01/20 0638  GLUCAP 87 98   Lipid Profile: No results for input(s): CHOL, HDL, LDLCALC, TRIG, CHOLHDL, LDLDIRECT in the last 72 hours. Thyroid Function Tests: No results for input(s): TSH, T4TOTAL, FREET4, T3FREE, THYROIDAB in the last 72 hours. Anemia Panel: No results for input(s): VITAMINB12, FOLATE, FERRITIN, TIBC, IRON, RETICCTPCT in the last 72 hours. Sepsis Labs: Recent Labs  Lab 09/29/20 1635 10/02/20 0135  PROCALCITON  --  0.25  LATICACIDVEN 1.3  --     Recent Results (from the past 240 hour(s))  Blood Culture (routine x 2)     Status: None   Collection Time: 09/29/20  4:35 PM   Specimen: BLOOD LEFT HAND  Result Value Ref Range Status   Specimen Description BLOOD LEFT HAND  Final   Special Requests   Final    BOTTLES DRAWN AEROBIC AND ANAEROBIC Blood Culture adequate volume   Culture   Final    NO GROWTH 5 DAYS Performed at [facility], [address] [place], [place] [address]    Report Status 10/04/2020 FINAL  Final  Blood Culture (routine x 2)     Status: None   Collection Time: 09/29/20  4:45 PM   Specimen: BLOOD RIGHT HAND  Result Value Ref Range Status   Specimen Description BLOOD RIGHT HAND  Final   Special Requests   Final    BOTTLES DRAWN AEROBIC AND ANAEROBIC Blood Culture results may not be optimal due to an inadequate volume of blood received in culture bottles    Culture   Final    NO GROWTH 5 DAYS Performed at [facility], [address], [place], [place] [address]    Report Status 10/04/2020 FINAL  Final  Resp Panel by RT-PCR (Flu A&B, Covid) Nasopharyngeal Medina     Status: None   Collection Time: 09/29/20  5:07 PM   Specimen: Nasopharyngeal Medina; Nasopharyngeal(NP) swabs in vial transport medium  Result Value Ref Range Status   SARS Coronavirus 2 by RT PCR NEGATIVE NEGATIVE Final    Comment: (NOTE) SARS-CoV-2 target nucleic acids are NOT DETECTED.  The SARS-CoV-2 RNA is generally detectable in upper respiratory specimens during the acute phase of infection. The lowest concentration of SARS-CoV-2 viral copies this assay can detect is 138 copies/mL. A negative result does not preclude SARS-Cov-2 infection and should not be used as the sole basis for treatment or other patient management decisions. A negative result may occur with  improper specimen collection/handling, submission of specimen other than nasopharyngeal Medina, presence of viral mutation(s) within the areas targeted by this assay, and inadequate number of viral copies(<138 copies/mL). A negative result must be combined with clinical observations, patient history, and epidemiological information. The expected result is Negative.  Fact Sheet for Patients:  EntrepreneurPulse.com.au  Fact Sheet for Healthcare Providers:  IncredibleEmployment.be  This test is no t yet approved or cleared by the [place] FDA and  has been authorized for detection and/or diagnosis of SARS-CoV-2 by FDA under an Emergency Use Authorization (EUA). This EUA will remain  in effect (meaning this test can be used) for the duration of the COVID-19 declaration under Section 564(b)(1) of the Act, 21 U.S.C.section 360bbb-3(b)(1), unless the authorization is terminated  or revoked sooner.       Influenza A by PCR NEGATIVE NEGATIVE Final   Influenza B by PCR NEGATIVE  NEGATIVE Final    Comment: (NOTE) The Xpert Xpress SARS-CoV-2/FLU/RSV plus assay is intended as an aid in the diagnosis of influenza from Nasopharyngeal  Medina specimens and should not be used as a sole basis for treatment. Nasal washings and aspirates are unacceptable for Xpert Xpress SARS-CoV-2/FLU/RSV testing.  Fact Sheet for Patients: EntrepreneurPulse.com.au  Fact Sheet for Healthcare Providers: IncredibleEmployment.be  This test is not yet approved or cleared by the [place] FDA and has been authorized for detection and/or diagnosis of SARS-CoV-2 by FDA under an Emergency Use Authorization (EUA). This EUA will remain in effect (meaning this test can be used) for the duration of the COVID-19 declaration under Section 564(b)(1) of the Act, 21 U.S.C. section 360bbb-3(b)(1), unless the authorization is terminated or revoked.  Performed at [facility], [address] [place], [place] [address]   Urine culture     Status: Abnormal   Collection Time: 09/29/20  5:51 PM   Specimen: In/Out Cath Urine  Result Value Ref Range Status   Specimen Description   Final    IN/OUT CATH URINE Performed at [facility], [address], [place], [place] [address]    Special Requests   Final    NONE Performed at [facility], [address] [place], [place] [address]    Culture 1,000 COLONIES/mL ENTEROCOCCUS FAECALIS (A)  Final   Report Status 10/03/2020 FINAL  Final   Organism ID, Bacteria ENTEROCOCCUS FAECALIS (A)  Final      Susceptibility   Enterococcus faecalis - MIC*    AMPICILLIN <=2 SENSITIVE Sensitive     NITROFURANTOIN <=16 SENSITIVE Sensitive     VANCOMYCIN 2 SENSITIVE Sensitive     * 1,000 COLONIES/mL ENTEROCOCCUS FAECALIS  Body fluid culture     Status: None   Collection Time: 09/30/20  9:38 AM   Specimen: Lung, Left; Pleural Fluid  Result Value Ref Range Status   Specimen Description PLEURAL FLUID  Final   Special Requests LEFT LUNG   Final   Gram Stain   Final    ABUNDANT WBC PRESENT,BOTH PMN AND MONONUCLEAR NO ORGANISMS SEEN    Culture   Final    NO GROWTH 3 DAYS Performed at [facility] [address], [place], [place] [address]    Report Status 10/03/2020 FINAL  Final  Acid Fast Smear (AFB)     Status: None   Collection Time: 09/30/20  9:38 AM   Specimen: Lung, Left; Pleural Fluid  Result Value Ref Range Status   AFB Specimen Processing Concentration  Final   Acid Fast Smear Negative  Final    Comment: (NOTE) Performed At: [facility] [place], [place] [id number] Rush Farmer MD VC:[id number]    Source (AFB) PLEURAL  Final    Comment: FLUID LEFT LUNG Performed at [facility], [place] [address] [place], [place] [address]          Radiology Studies: CARDIAC CATHETERIZATION  Result Date: 10/04/2020  Dist LAD lesion is 50% stenosed.  1st RPL lesion is 20% stenosed.  Mid RCA lesion is 25% stenosed.  2nd Diag lesion is 40% stenosed.  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 No severe lesions or culprit for NSTEMI - suspect demand ischemia        Scheduled Meds: . amoxicillin  500 mg Oral Q8H  . aspirin EC  81 mg Oral Daily  . atorvastatin  20 mg Oral Daily  .  enoxaparin (LOVENOX) injection  40 mg Subcutaneous QHS  . mouth rinse  15 mL Mouth Rinse BID  . metoprolol tartrate  25 mg Oral BID  . sodium chloride flush  3 mL Intravenous Q12H  . sodium chloride flush  3 mL Intravenous Q12H   Continuous Infusions: . sodium chloride       LOS: 7 days    Time spent:40 min    Melissia Lahman, Geraldo Docker, MD [facility] Pager [phone]  If 7PM-7AM, please contact night-coverage [email or web address] Password  [facility] 10/06/2020, 10:27 AM

## 2020-10-06 NOTE — Plan of Care (Signed)
Progressing, will continue to monitor.  

## 2020-10-07 ENCOUNTER — Encounter (HOSPITAL_COMMUNITY): Payer: Self-pay | Admitting: Cardiovascular Disease

## 2020-10-07 DIAGNOSIS — I5021 Acute systolic (congestive) heart failure: Secondary | ICD-10-CM

## 2020-10-07 DIAGNOSIS — C3412 Malignant neoplasm of upper lobe, left bronchus or lung: Secondary | ICD-10-CM | POA: Diagnosis present

## 2020-10-07 DIAGNOSIS — R918 Other nonspecific abnormal finding of lung field: Secondary | ICD-10-CM | POA: Diagnosis present

## 2020-10-07 DIAGNOSIS — R778 Other specified abnormalities of plasma proteins: Secondary | ICD-10-CM

## 2020-10-07 LAB — CBC WITH DIFFERENTIAL/PLATELET
Abs Immature Granulocytes: 0.11 10*3/uL — ABNORMAL HIGH (ref 0.00–0.07)
Basophils Absolute: 0 10*3/uL (ref 0.0–0.1)
Basophils Relative: 0 %
Eosinophils Absolute: 0 10*3/uL (ref 0.0–0.5)
Eosinophils Relative: 0 %
HCT: 37.2 % — ABNORMAL LOW (ref 39.0–52.0)
Hemoglobin: 11.5 g/dL — ABNORMAL LOW (ref 13.0–17.0)
Immature Granulocytes: 1 %
Lymphocytes Relative: 6 %
Lymphs Abs: 0.6 10*3/uL — ABNORMAL LOW (ref 0.7–4.0)
MCH: 24.5 pg — ABNORMAL LOW (ref 26.0–34.0)
MCHC: 30.9 g/dL (ref 30.0–36.0)
MCV: 79.1 fL — ABNORMAL LOW (ref 80.0–100.0)
Monocytes Absolute: 1.3 10*3/uL — ABNORMAL HIGH (ref 0.1–1.0)
Monocytes Relative: 12 %
Neutro Abs: 8.7 10*3/uL — ABNORMAL HIGH (ref 1.7–7.7)
Neutrophils Relative %: 81 %
Platelets: 255 10*3/uL (ref 150–400)
RBC: 4.7 MIL/uL (ref 4.22–5.81)
RDW: 16.8 % — ABNORMAL HIGH (ref 11.5–15.5)
WBC: 10.9 10*3/uL — ABNORMAL HIGH (ref 4.0–10.5)
nRBC: 0 % (ref 0.0–0.2)

## 2020-10-07 LAB — COMPREHENSIVE METABOLIC PANEL
ALT: 49 U/L — ABNORMAL HIGH (ref 0–44)
AST: 40 U/L (ref 15–41)
Albumin: 2 g/dL — ABNORMAL LOW (ref 3.5–5.0)
Alkaline Phosphatase: 224 U/L — ABNORMAL HIGH (ref 38–126)
Anion gap: 9 (ref 5–15)
BUN: 18 mg/dL (ref 6–20)
CO2: 24 mmol/L (ref 22–32)
Calcium: 8.5 mg/dL — ABNORMAL LOW (ref 8.9–10.3)
Chloride: 99 mmol/L (ref 98–111)
Creatinine, Ser: 1.19 mg/dL (ref 0.61–1.24)
GFR, Estimated: >60 mL/min (ref >60)
Glucose, Bld: 94 mg/dL (ref 70–99)
Potassium: 4.2 mmol/L (ref 3.5–5.1)
Sodium: 132 mmol/L — ABNORMAL LOW (ref 135–145)
Total Bilirubin: 0.8 mg/dL (ref 0.3–1.2)
Total Protein: 5.1 g/dL — ABNORMAL LOW (ref 6.5–8.1)

## 2020-10-07 LAB — MAGNESIUM: Magnesium: 1.8 mg/dL (ref 1.7–2.4)

## 2020-10-07 LAB — CYTOLOGY - NON PAP

## 2020-10-07 LAB — PHOSPHORUS: Phosphorus: 3.5 mg/dL (ref 2.5–4.6)

## 2020-10-07 MED ORDER — METOPROLOL TARTRATE 50 MG PO TABS
50.0000 mg | ORAL_TABLET | Freq: Two times a day (BID) | ORAL | Status: AC
  Administered 2020-10-07: 50 mg via ORAL
  Filled 2020-10-07: qty 1

## 2020-10-07 MED ORDER — METOPROLOL SUCCINATE ER 100 MG PO TB24
100.0000 mg | ORAL_TABLET | Freq: Every day | ORAL | Status: DC
  Administered 2020-10-08 – 2020-10-09 (×2): 100 mg via ORAL
  Filled 2020-10-07 (×3): qty 1

## 2020-10-07 MED ORDER — METOPROLOL TARTRATE 25 MG PO TABS
25.0000 mg | ORAL_TABLET | Freq: Once | ORAL | Status: AC
  Administered 2020-10-07: 25 mg via ORAL
  Filled 2020-10-07: qty 1

## 2020-10-07 NOTE — Progress Notes (Signed)
PROGRESS NOTE   Brett Medina  ZOX:[id number] DOB: [birth year] DOA: 09/29/2020 PCP: Patient, No Pcp Per  Brief Narrative:  59 year old white male known tobacco abuse admitted with shortness of breath + DOE X 2 months 09/29/2020-CT on admission found to have moderate to large volume left pleural effusion with complete collapse left upper and lower lobes?  Malignancy--troponin elevated 1300 leukocytosis 13.2 Ceftriaxone doxycycline started on admission Cardiology consulted for elevated troponin-status post left thoracentesis 09/30/2020-Echo 11/29 LVEF 40-45% with global hypokinesis   Assessment & Plan:   Principal Problem:   Large pleural effusion Active Problems:   Elevated troponin   Demand ischemia ([place])   1. Acute respiratory failure with hypoxia status post left thoracentesis 950 cc a. Wean oxygen as tolerated keep Above 92% b. Ambulatory and doing fairly well c. Expect patient may need oxygen on discharge desat screen ordered 2. Elevated troponin secondary to demand ischemia no target lesion previously on heparin 3. Acute systolic heart failure EF 40-45% a. Coronary angiography 12/3 = no obstructive disease and suspected demand ischemia b. Per cardiology at this time continue metoprolol 25 twice daily with as needed labetalol--- he has been cleared as above for procedure c. Continue aspirin 81 mg d. Cardiology following peripherally 4. Persistent sinus tachycardia/?  PE a. Cannot rule out as patient refuses contrast-bilateral LE duplex negative for DVT b. Apparently heparin was discontinued on 12/4 and change to prophylactic dosing by Dr. Jerilee Field duplex was negative for DVT so lower risk 5. Large probably exudative left pleural effusion a. Cytology from 11/29 is pending defer to pulmonology-possible EBUS per them b.  I will repeat chest x-ray 12/7 as he is still on 3 L of oxygen c. Ambulatory desat screen is moving around in the room comfortably 6. Leukocytosis a. Doubt  that this is secondary to pneumonia 7. AKI on admission resolved a. Improving-monitor trends 8. Enterococcus 1000 colony-forming units-not a UTI a. Patient placed on amoxicillin 500 every 8 since 12/4 b. Circumscribed course until 12/7 and reevaluate  DVT prophylaxis: Heparin Code Status: Full Family Communication: None Disposition:  Status is: Inpatient  Remains inpatient appropriate because:Hemodynamically unstable, Ongoing diagnostic testing needed not appropriate for outpatient work up and IV treatments appropriate due to intensity of illness or inability to take PO   Dispo:  Patient From: Home  Planned Disposition: Home  Expected discharge date: 10/07/20  Medically stable for discharge: No        Consultants:   Pulmonary  Cardiology  Procedures: Conclusion from 12/3    Dist LAD lesion is 50% stenosed.  1st RPL lesion is 20% stenosed.  Mid RCA lesion is 25% stenosed.  2nd Diag lesion is 40% stenosed.   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 No severe lesions or culprit for NSTEMI - suspect demand ischemia   Consultants:  PCCM Cardiology IR    Procedures/Significant Events:  11/30  LEFT thoracentesis aspirated 950 cc fluid 11/30 PCXR;Increased size of large left pleural effusion with decreased left upper lung aeration. -Right perihilar/basilar opacities are grossly unchanged 12/1 LEFT thoracentesis; aspirated 676ml maroon-colored fluid  Antimicrobials: Amoxicillin   Subjective: Doing well Pleasant coherent alert no distress Sitting up in chair moved around the room comfortably per him Asked me if he was ever not need oxygen No chest  pain no fever No dark or tarry stool  Objective: Vitals:    10/06/20 2233 10/06/20 2313 10/07/20 0355 10/07/20 0735  BP:  (!) 121/99 (!) 125/97 (!) 135/96  Pulse: 96 95 (!) 104 (!) 114  Resp: 14 13 15 17   Temp:  98.4 F (36.9 C) 98.5 F (36.9 C) 97.6 F (36.4 C)  TempSrc:  Oral Oral Oral  SpO2: 99% 99% 99% 99%  Weight:      Height:        Intake/Output Summary (Last 24 hours) at 10/07/2020 0753 Last data filed at 10/07/2020 0400 Gross per 24 hour  Intake --  Output 1175 ml  Net -1175 ml   Filed Weights   10/04/20 0500 10/05/20 0300 10/06/20 0242  Weight: 88.6 kg 90.6 kg 90.5 kg    Examination: EOMI NCAT looks about stated age no icterus no pallor neck soft supple S1-S2 sinus, sinus tach to exam cannot appreciate murmur Air entry is decreased on the left side compared to the right but only minimally to fremitus Abdomen is obese nontender He has hyperkeratosis to lower extremities and slight pitting edema   Data Reviewed: I have personally reviewed following labs and imaging studies Sodium 132 potassium 4.2 BUN/creatinine down from 21/1.3--->18/1.1 White count down from 16.5-->10.9 Hemoglobin 11.2-->11.5  Radiology Studies: No results found.   Scheduled Meds: . amoxicillin  500 mg Oral Q8H  . aspirin EC  81 mg Oral Daily  . atorvastatin  20 mg Oral Daily  . enoxaparin (LOVENOX) injection  40 mg Subcutaneous QHS  . mouth rinse  15 mL Mouth Rinse BID  . metoprolol tartrate  25 mg Oral BID  . sodium chloride flush  3 mL Intravenous Q12H  . sodium chloride flush  3 mL Intravenous Q12H   Continuous Infusions: . sodium chloride       LOS: 8 days    Time spent: [place], MD [facility] To contact the attending provider between 7A-7P or the covering provider during after hours 7P-7A, please log into the web site [email or web address] and access using universal [place] password for that web site. If you do not have the password, please call the hospital operator.  10/07/2020, 7:53 AM

## 2020-10-07 NOTE — H&P (View-Only) (Signed)
NAME:  Brett Medina, MRN:  [id number], DOB:  Mar 12, 1961, LOS: 8 ADMISSION DATE:  09/29/2020, CONSULTATION DATE:  10/01/2020 REFERRING MD:  Dr. Nevada Crane , CHIEF COMPLAINT:  Pleural Effusion  Brief History   59 year old male with hx of tobacco abuse presenting with progressive shortness of breath with CT findings concerning for metastatic lung process with large left pleural effusion.    History of present illness    59 year old male with no significant past medical history other than tobacco abuse with a 68 year pack history (reports smoking for 45 years, 1.5 ppd) in which he quit smoking 2 months ago.  He does not see a physician on a regular basis.  Reports chronic bilateral lower leg swelling, as it runs in his family.    Presents with progressive two month history of progressive dyspnea.  Started out in September as dyspnea on exertion which progressed to dsypnea even at rest.  Reports chronic unchanged productive cough- with clear sputum.  Also reports 15lb weight loss over the last month.  He was seen at urgent care several weeks ago and prescribed prednisone, which did not help.  On admit, he was tachycardic, mildly tachypneic, and room air saturations of 91-97%.  Workup notable for WBC 13.2, troponin 1087->1380-> 1446-> 746, BNP 205, SARS2 negative, moderate Hgb on UA.  CXR showed near complete consolidation of left hemithorax with combination of pleural effusion and consolidation of left lung, with peripheral nodular opacity of right mid lung, all concerning for malignancy.  He was cultured and started on empiric antibiotics.  He was admitted to [facility] for further workup.    Cardiology consulted and are following given elevated troponin's and started on heparin gtt.  TTE showing LVEF 40-45% with global hypokinesis.  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  He underwent a left thoracentesis in IR on 11/29 with 950 ml fluid removed and sent for cytology and pleural studies.  Reports some improvement in SOB since thoracentesis.   Repeat CXR today, shows increased size in left pleural effusion.  Patient reports no worsening of SOB today.  Pulmonary consulted for further management and evaluation of pleural effusion and pulmonary nodules.   Past Medical History  Tobacco abuse   Significant Hospital Events   11/28 Admitted [place] 11/29 left thoracentesis/ cards consult  Consults:  IR Cardiology  Pulmonary  Procedures:  11/29 Left thoracentesis IR-> 950 ml of dark brown fluid  Significant Diagnostic Tests:  11/28 CT chest wo contrast >> 1. Constellation of findings consistent with metastatic malignancy. 2. Moderate to large volume left pleural effusion with almost complete collapse of the left upper and lower lobes. Associated mediastinal lymphadenopathy and limited evaluation for hilar lymphadenopathy on this noncontrast study. Underlying malignancy suspected (ddx includes metastasis or primary lung). Consider analysis of the pleural fluid for further evaluation. 3. Indeterminate multifocal right lung pulmonary nodules measuring up to 1 cm in the right middle lobe. 4. Indeterminate 3.2 cm peribronchovascular ground-glass airspace opacity within the right lower lobe. 5. Scattered suspicious sclerotic lesions concerning for osseous metastases. 6. Indeterminate pericentimeter subcutaneus soft tissue densities within the bilateral anterolateral back at the level of scapulas. 7. Severe emphysematous changes. 8. Ascending aorta aneurysm measuring  up to 4.5 cm. 9. Aortic Atherosclerosis (ICD10-I70.0) and Emphysema  TTE  11/29 >> 1. Left ventricular ejection fraction, by estimation, is 40 to 45%. The  left ventricle has mildly decreased function. The left ventricle  demonstrates global hypokinesis. Left ventricular diastolic parameters are  indeterminate.  2. Right ventricular systolic function is normal. The right ventricular  size is normal. Tricuspid regurgitation signal is inadequate for assessing  PA pressure.  3. The mitral valve is normal in structure. No evidence of mitral valve  regurgitation. No evidence of mitral stenosis.  4. The aortic valve was not well visualized. Aortic valve regurgitation  is trivial. No aortic stenosis is present.  5. Aortic dilatation noted. 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  6. The inferior vena cava is dilated in size with <50% respiratory  variability, suggesting right atrial pressure of 15 mmHg.    Micro Data:  11/28 SARS 2/ flu >>neg 11/28 BCx2 >> ngtd 11/28 UC >> 11/29 left pleural studies: GS >> NGTD          AFB >> NGTD                     Culture >> NGTD                                 Cytology >> pending           Antimicrobials:  11/28 azithro >> completed 11/28 ceftriaxone >> completed  Interim history/subjective:   No new issues reported Reviewed case with pathology as below   Objective   Blood pressure (!) 135/96, pulse (!) 114, temperature 97.6 F (36.4 C), temperature source Oral, resp. rate 17, height 5\' 9"  (1.753 m), weight 90.5 kg, SpO2 99 %.        Intake/Output Summary (Last 24 hours) at 10/07/2020 1014 Last data filed at 10/07/2020 0400 Gross per 24 hour  Intake --  Output 875 ml  Net -875 ml   Filed Weights   10/04/20 0500 10/05/20 0300 10/06/20 0242  Weight: 88.6 kg 90.6 kg 90.5 kg   Physical Exam: General: Sitting up at bedside, no distress HENT: Oropharynx clear, no lesions Eyes: Pupils equal Respiratory: Decreased on the left, scattered rhonchi, mostly clear on the  right Cardiovascular: Regular, distant, no murmur GI: Nondistended, positive bowel sounds Extremities: 1+ lower extremity edema  Resolved Hospital Problem list    Assessment & Plan:   Acute hypoxic respiratory failure secondary to lung mass with atelectasis, pleural effusion Emphysematous changes on CT - Lower extremity doppler negative.  RV reassuring on TTE, no evidence acute PE; given contrast dye allergy, CTA not performed - continue supplemental O2 for sat goal > 88% -Reviewed cytology with Dr. Melina Copa in pathology 12/6.  Cell block has been sent for immunohistochemistry stains.  There is a chance will get nondiagnostic final report.  Certainly even if we do get a diagnosis there will not be enough material for molecular studies. Plan: Continue to wait for cytology results. As we do so I recommend bronchoscopy to get a definitive diagnosis, get enough material to send for molecular studies as this will be important in planning his ultimate therapy. Bronchoscopy scheduled for 1300 on 12/7.  I will hold his enoxaparin, make n.p.o. Intermediate risk for anesthesia and bronchoscopy per cardiology notes.  With him.  Systolic HF Elevated troponin, s/p LHC, likely demand ischemia  Left heart  catheterization with nonobstructive CAD. Tachycardia - per Cardiology- ASA, statin, BB; - TTE w/ LVEF 40-45% with global hypokinesis Plan: Off heparin infusion, currently on enoxaparin daily.  Plan to hold and prep for procedure 12/7   Best practice   Diet: heart healthy Pain/Anxiety/Delirium protocol (if indicated): n/a VAP protocol (if indicated): n/a DVT prophylaxis: heparin gtt GI prophylaxis: n/a Glucose control: n/a Mobility: as tolerated Code Status: full  Disposition: telemetry  Labs   CBC: Recent Labs  Lab 10/03/20 0323 10/04/20 0154 10/05/20 0115 10/06/20 0206 10/07/20 0234  WBC 10.7* 10.8* 16.5* 11.1* 10.9*  NEUTROABS 8.6* 10.2* 14.1* 9.1* 8.7*  HGB 12.5* 12.3* 11.2*  11.4* 11.5*  HCT 37.2* 39.5 36.6* 35.7* 37.2*  MCV 77.3* 79.0* 79.6* 78.5* 79.1*  PLT 267 256 300 324 433    Basic Metabolic Panel: Recent Labs  Lab 10/03/20 0323 10/04/20 0154 10/05/20 0115 10/06/20 0206 10/07/20 0234  NA 135 133* 136 134* 132*  K 3.8 4.6 5.1 4.1 4.2  CL 99 102 101 101 99  CO2 23 22 28 24 24   GLUCOSE 102* 144* 146* 119* 94  BUN 10 14 21* 21* 18  CREATININE 1.16 1.21 1.39* 1.26* 1.19  CALCIUM 8.8* 8.9 8.9 8.7* 8.5*  MG 1.6* 2.0 2.2 1.8 1.8  PHOS 3.7 4.3 4.2 4.1 3.5   GFR: Estimated Creatinine Clearance: 75.2 mL/min (by C-G formula based on SCr of 1.19 mg/dL). Recent Labs  Lab 10/02/20 0135 10/03/20 0323 10/04/20 0154 10/05/20 0115 10/06/20 0206 10/07/20 0234  PROCALCITON 0.25  --   --   --   --   --   WBC 11.5*   < > 10.8* 16.5* 11.1* 10.9*   < > = values in this interval not displayed.    Liver Function Tests: Recent Labs  Lab 10/03/20 0323 10/04/20 0154 10/05/20 0115 10/06/20 0206 10/07/20 0234  AST 30 40 46* 53* 40  ALT 26 27 38 55* 49*  ALKPHOS 88 106 149* 175* 224*  BILITOT 0.6 0.5 0.4 0.6 0.8  PROT 5.2* 5.6* 5.1* 5.5* 5.1*  ALBUMIN 2.1* 2.1* 1.9* 2.1* 2.0*   No results for input(s): LIPASE, AMYLASE in the last 168 hours. No results for input(s): AMMONIA in the last 168 hours.  ABG No results found for: PHART, PCO2ART, PO2ART, HCO3, TCO2, ACIDBASEDEF, O2SAT   Coagulation Profile: No results for input(s): INR, PROTIME in the last 168 hours.  Cardiac Enzymes: No results for input(s): CKTOTAL, CKMB, CKMBINDEX, TROPONINI in the last 168 hours.  HbA1C: Hgb A1c MFr Bld  Date/Time Value Ref Range Status  09/30/2020 02:13 AM 5.7 (H) 4.8 - 5.6 % Final    Comment:    (NOTE) Pre diabetes:          5.7%-6.4%  Diabetes:              >6.4%  Glycemic control for   <7.0% adults with diabetes     CBG: Recent Labs  Lab 10/01/20 0118 10/01/20 0638  GLUCAP 87 98    Baltazar Apo, MD, PhD 10/07/2020, 10:14 AM [facility] [phone] or if no answer [phone]

## 2020-10-07 NOTE — Progress Notes (Signed)
Pt did not want to walk for ambulating oxygen saturation at this time. Will try again later.   Clyde Canterbury, RN

## 2020-10-07 NOTE — Progress Notes (Signed)
NAME:  Brett Medina, MRN:  [id number], DOB:  08-Jun-1961, LOS: 8 ADMISSION DATE:  09/29/2020, CONSULTATION DATE:  10/01/2020 REFERRING MD:  Dr. Nevada Crane , CHIEF COMPLAINT:  Pleural Effusion  Brief History   59 year old male with hx of tobacco abuse presenting with progressive shortness of breath with CT findings concerning for metastatic lung process with large left pleural effusion.    History of present illness    59 year old male with no significant past medical history other than tobacco abuse with a 68 year pack history (reports smoking for 45 years, 1.5 ppd) in which he quit smoking 2 months ago.  He does not see a physician on a regular basis.  Reports chronic bilateral lower leg swelling, as it runs in his family.    Presents with progressive two month history of progressive dyspnea.  Started out in September as dyspnea on exertion which progressed to dsypnea even at rest.  Reports chronic unchanged productive cough- with clear sputum.  Also reports 15lb weight loss over the last month.  He was seen at urgent care several weeks ago and prescribed prednisone, which did not help.  On admit, he was tachycardic, mildly tachypneic, and room air saturations of 91-97%.  Workup notable for WBC 13.2, troponin 1087->1380-> 1446-> 746, BNP 205, SARS2 negative, moderate Hgb on UA.  CXR showed near complete consolidation of left hemithorax with combination of pleural effusion and consolidation of left lung, with peripheral nodular opacity of right mid lung, all concerning for malignancy.  He was cultured and started on empiric antibiotics.  He was admitted to [facility] for further workup.    Cardiology consulted and are following given elevated troponin's and started on heparin gtt.  TTE showing LVEF 40-45% with global hypokinesis.  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  He underwent a left thoracentesis in IR on 11/29 with 950 ml fluid removed and sent for cytology and pleural studies.  Reports some improvement in SOB since thoracentesis.   Repeat CXR today, shows increased size in left pleural effusion.  Patient reports no worsening of SOB today.  Pulmonary consulted for further management and evaluation of pleural effusion and pulmonary nodules.   Past Medical History  Tobacco abuse   Significant Hospital Events   11/28 Admitted [place] 11/29 left thoracentesis/ cards consult  Consults:  IR Cardiology  Pulmonary  Procedures:  11/29 Left thoracentesis IR-> 950 ml of dark brown fluid  Significant Diagnostic Tests:  11/28 CT chest wo contrast >> 1. Constellation of findings consistent with metastatic malignancy. 2. Moderate to large volume left pleural effusion with almost complete collapse of the left upper and lower lobes. Associated mediastinal lymphadenopathy and limited evaluation for hilar lymphadenopathy on this noncontrast study. Underlying malignancy suspected (ddx includes metastasis or primary lung). Consider analysis of the pleural fluid for further evaluation. 3. Indeterminate multifocal right lung pulmonary nodules measuring up to 1 cm in the right middle lobe. 4. Indeterminate 3.2 cm peribronchovascular ground-glass airspace opacity within the right lower lobe. 5. Scattered suspicious sclerotic lesions concerning for osseous metastases. 6. Indeterminate pericentimeter subcutaneus soft tissue densities within the bilateral anterolateral back at the level of scapulas. 7. Severe emphysematous changes. 8. Ascending aorta aneurysm measuring  up to 4.5 cm. 9. Aortic Atherosclerosis (ICD10-I70.0) and Emphysema  TTE  11/29 >> 1. Left ventricular ejection fraction, by estimation, is 40 to 45%. The  left ventricle has mildly decreased function. The left ventricle  demonstrates global hypokinesis. Left ventricular diastolic parameters are  indeterminate.  2. Right ventricular systolic function is normal. The right ventricular  size is normal. Tricuspid regurgitation signal is inadequate for assessing  PA pressure.  3. The mitral valve is normal in structure. No evidence of mitral valve  regurgitation. No evidence of mitral stenosis.  4. The aortic valve was not well visualized. Aortic valve regurgitation  is trivial. No aortic stenosis is present.  5. Aortic dilatation noted. 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  6. The inferior vena cava is dilated in size with <50% respiratory  variability, suggesting right atrial pressure of 15 mmHg.    Micro Data:  11/28 SARS 2/ flu >>neg 11/28 BCx2 >> ngtd 11/28 UC >> 11/29 left pleural studies: GS >> NGTD          AFB >> NGTD                     Culture >> NGTD                                 Cytology >> pending           Antimicrobials:  11/28 azithro >> completed 11/28 ceftriaxone >> completed  Interim history/subjective:   No new issues reported Reviewed case with pathology as below   Objective   Blood pressure (!) 135/96, pulse (!) 114, temperature 97.6 F (36.4 C), temperature source Oral, resp. rate 17, height 5\' 9"  (1.753 m), weight 90.5 kg, SpO2 99 %.        Intake/Output Summary (Last 24 hours) at 10/07/2020 1014 Last data filed at 10/07/2020 0400 Gross per 24 hour  Intake --  Output 875 ml  Net -875 ml   Filed Weights   10/04/20 0500 10/05/20 0300 10/06/20 0242  Weight: 88.6 kg 90.6 kg 90.5 kg   Physical Exam: General: Sitting up at bedside, no distress HENT: Oropharynx clear, no lesions Eyes: Pupils equal Respiratory: Decreased on the left, scattered rhonchi, mostly clear on the  right Cardiovascular: Regular, distant, no murmur GI: Nondistended, positive bowel sounds Extremities: 1+ lower extremity edema  Resolved Hospital Problem list    Assessment & Plan:   Acute hypoxic respiratory failure secondary to lung mass with atelectasis, pleural effusion Emphysematous changes on CT - Lower extremity doppler negative.  RV reassuring on TTE, no evidence acute PE; given contrast dye allergy, CTA not performed - continue supplemental O2 for sat goal > 88% -Reviewed cytology with Dr. Melina Copa in pathology 12/6.  Cell block has been sent for immunohistochemistry stains.  There is a chance will get nondiagnostic final report.  Certainly even if we do get a diagnosis there will not be enough material for molecular studies. Plan: Continue to wait for cytology results. As we do so I recommend bronchoscopy to get a definitive diagnosis, get enough material to send for molecular studies as this will be important in planning his ultimate therapy. Bronchoscopy scheduled for 1300 on 12/7.  I will hold his enoxaparin, make n.p.o. Intermediate risk for anesthesia and bronchoscopy per cardiology notes.  With him.  Systolic HF Elevated troponin, s/p LHC, likely demand ischemia  Left heart  catheterization with nonobstructive CAD. Tachycardia - per Cardiology- ASA, statin, BB; - TTE w/ LVEF 40-45% with global hypokinesis Plan: Off heparin infusion, currently on enoxaparin daily.  Plan to hold and prep for procedure 12/7   Best practice   Diet: heart healthy Pain/Anxiety/Delirium protocol (if indicated): n/a VAP protocol (if indicated): n/a DVT prophylaxis: heparin gtt GI prophylaxis: n/a Glucose control: n/a Mobility: as tolerated Code Status: full  Disposition: telemetry  Labs   CBC: Recent Labs  Lab 10/03/20 0323 10/04/20 0154 10/05/20 0115 10/06/20 0206 10/07/20 0234  WBC 10.7* 10.8* 16.5* 11.1* 10.9*  NEUTROABS 8.6* 10.2* 14.1* 9.1* 8.7*  HGB 12.5* 12.3* 11.2*  11.4* 11.5*  HCT 37.2* 39.5 36.6* 35.7* 37.2*  MCV 77.3* 79.0* 79.6* 78.5* 79.1*  PLT 267 256 300 324 035    Basic Metabolic Panel: Recent Labs  Lab 10/03/20 0323 10/04/20 0154 10/05/20 0115 10/06/20 0206 10/07/20 0234  NA 135 133* 136 134* 132*  K 3.8 4.6 5.1 4.1 4.2  CL 99 102 101 101 99  CO2 23 22 28 24 24   GLUCOSE 102* 144* 146* 119* 94  BUN 10 14 21* 21* 18  CREATININE 1.16 1.21 1.39* 1.26* 1.19  CALCIUM 8.8* 8.9 8.9 8.7* 8.5*  MG 1.6* 2.0 2.2 1.8 1.8  PHOS 3.7 4.3 4.2 4.1 3.5   GFR: Estimated Creatinine Clearance: 75.2 mL/min (by C-G formula based on SCr of 1.19 mg/dL). Recent Labs  Lab 10/02/20 0135 10/03/20 0323 10/04/20 0154 10/05/20 0115 10/06/20 0206 10/07/20 0234  PROCALCITON 0.25  --   --   --   --   --   WBC 11.5*   < > 10.8* 16.5* 11.1* 10.9*   < > = values in this interval not displayed.    Liver Function Tests: Recent Labs  Lab 10/03/20 0323 10/04/20 0154 10/05/20 0115 10/06/20 0206 10/07/20 0234  AST 30 40 46* 53* 40  ALT 26 27 38 55* 49*  ALKPHOS 88 106 149* 175* 224*  BILITOT 0.6 0.5 0.4 0.6 0.8  PROT 5.2* 5.6* 5.1* 5.5* 5.1*  ALBUMIN 2.1* 2.1* 1.9* 2.1* 2.0*   No results for input(s): LIPASE, AMYLASE in the last 168 hours. No results for input(s): AMMONIA in the last 168 hours.  ABG No results found for: PHART, PCO2ART, PO2ART, HCO3, TCO2, ACIDBASEDEF, O2SAT   Coagulation Profile: No results for input(s): INR, PROTIME in the last 168 hours.  Cardiac Enzymes: No results for input(s): CKTOTAL, CKMB, CKMBINDEX, TROPONINI in the last 168 hours.  HbA1C: Hgb A1c MFr Bld  Date/Time Value Ref Range Status  09/30/2020 02:13 AM 5.7 (H) 4.8 - 5.6 % Final    Comment:    (NOTE) Pre diabetes:          5.7%-6.4%  Diabetes:              >6.4%  Glycemic control for   <7.0% adults with diabetes     CBG: Recent Labs  Lab 10/01/20 0118 10/01/20 0638  GLUCAP 87 98    Baltazar Apo, MD, PhD 10/07/2020, 10:14 AM [facility] [phone] or if no answer [phone]

## 2020-10-07 NOTE — Progress Notes (Signed)
Progress Note  Patient Name: Brett Medina Date of Encounter: 10/07/2020  [facility] Cardiologist: Elouise Munroe, MD   Subjective   Feeling OK.  Breathing improving.  Denies chest pain.   Inpatient Medications    Scheduled Meds: . amoxicillin  500 mg Oral Q8H  . aspirin EC  81 mg Oral Daily  . atorvastatin  20 mg Oral Daily  . mouth rinse  15 mL Mouth Rinse BID  . metoprolol tartrate  25 mg Oral BID  . sodium chloride flush  3 mL Intravenous Q12H  . sodium chloride flush  3 mL Intravenous Q12H   Continuous Infusions: . sodium chloride     PRN Meds: sodium chloride, acetaminophen **OR** acetaminophen, guaiFENesin-codeine, labetalol, levalbuterol, lidocaine (PF), lidocaine, loperamide, ondansetron **OR** ondansetron (ZOFRAN) IV, polyethylene glycol, sodium chloride flush   Vital Signs    Vitals:   10/06/20 2313 10/07/20 0355 10/07/20 0735 10/07/20 1107  BP: (!) 121/99 (!) 125/97 (!) 135/96 (!) 122/91  Pulse: 95 (!) 104 (!) 114 (!) 111  Resp: 13 15 17 19   Temp: 98.4 F (36.9 C) 98.5 F (36.9 C) 97.6 F (36.4 C) 99.3 F (37.4 C)  TempSrc: Oral Oral Oral Oral  SpO2: 99% 99% 99% 98%  Weight:      Height:        Intake/Output Summary (Last 24 hours) at 10/07/2020 1125 Last data filed at 10/07/2020 0400 Gross per 24 hour  Intake --  Output 875 ml  Net -875 ml   Last 3 Weights 10/06/2020 10/05/2020 10/04/2020  Weight (lbs) 199 lb 9.7 oz 199 lb 12.8 oz 195 lb 6.4 oz  Weight (kg) 90.54 kg 90.629 kg 88.633 kg      Telemetry    Sinus tachycardia.  PVCs.  - Personally Reviewed  ECG    n/a - Personally Reviewed  Physical Exam   VS:  BP (!) 122/91 (BP Location: Left Arm)   Pulse (!) 111   Temp 99.3 F (37.4 C) (Oral)   Resp 19   Ht 5\' 9"  (1.753 m)   Wt 90.5 kg   SpO2 98%   BMI 29.48 kg/m  , BMI Body mass index is 29.48 kg/m. GENERAL:  Well appearing HEENT: Pupils equal round and reactive, fundi not visualized, oral mucosa unremarkable NECK:  No  jugular venous distention, waveform within normal limits, carotid upstroke brisk and symmetric, no bruits LUNGS: Diminished on the L base. HEART:  RRR.  PMI not displaced or sustained,S1 and S2 within normal limits, no S3, no S4, no clicks, no rubs, no murmurs ABD:  Flat, positive bowel sounds normal in frequency in pitch, no bruits, no rebound, no guarding, no midline pulsatile mass, no hepatomegaly, no splenomegaly EXT:  2 plus pulses throughout, no edema, no cyanosis no clubbing SKIN:  No rashes no nodules NEURO:  Cranial nerves II through XII grossly intact, motor grossly intact throughout PSYCH:  Cognitively intact, oriented to person place and time   Labs    High Sensitivity Troponin:   Recent Labs  Lab 09/29/20 1635 09/29/20 2032 09/30/20 0213 10/01/20 0917  TROPONINIHS 1,087* 1,380* 1,446* 746*      Chemistry Recent Labs  Lab 10/05/20 0115 10/06/20 0206 10/07/20 0234  NA 136 134* 132*  K 5.1 4.1 4.2  CL 101 101 99  CO2 28 24 24   GLUCOSE 146* 119* 94  BUN 21* 21* 18  CREATININE 1.39* 1.26* 1.19  CALCIUM 8.9 8.7* 8.5*  PROT 5.1* 5.5* 5.1*  ALBUMIN  1.9* 2.1* 2.0*  AST 46* 53* 40  ALT 38 55* 49*  ALKPHOS 149* 175* 224*  BILITOT 0.4 0.6 0.8  GFRNONAA 59* >60 >60  ANIONGAP 7 9 9      Hematology Recent Labs  Lab 10/05/20 0115 10/06/20 0206 10/07/20 0234  WBC 16.5* 11.1* 10.9*  RBC 4.60 4.55 4.70  HGB 11.2* 11.4* 11.5*  HCT 36.6* 35.7* 37.2*  MCV 79.6* 78.5* 79.1*  MCH 24.3* 25.1* 24.5*  MCHC 30.6 31.9 30.9  RDW 16.8* 17.0* 16.8*  PLT 300 324 255    BNPNo results for input(s): BNP, PROBNP in the last 168 hours.   DDimer No results for input(s): DDIMER in the last 168 hours.   Radiology    No results found.  Cardiac Studies   LHC 10/04/20:   Dist LAD lesion is 50% stenosed.  1st RPL lesion is 20% stenosed.  Mid RCA lesion is 25% stenosed.  2nd Diag lesion is 40% stenosed.  1) Pt with nonobstructive CAD  Moderate diffuse mid-LAD  stenosis  Moderate diffuse diagonal stenosis  Patent LCx without stenosis  Patent RCA with mild aneurysmal dilatation of the mid-vessel and aneurysmal dilatation of the PLA branch 2) Normal LVEDP  Recommend: medical therapy. No severe lesions or culprit for NSTEMI - suspect demand ischemia  Echo 09/30/20: 1. Left ventricular ejection fraction, by estimation, is 40 to 45%. The  left ventricle has mildly decreased function. The left ventricle  demonstrates global hypokinesis. Left ventricular diastolic parameters are  indeterminate.  2. Right ventricular systolic function is normal. The right ventricular  size is normal. Tricuspid regurgitation signal is inadequate for assessing  PA pressure.  3. The mitral valve is normal in structure. No evidence of mitral valve  regurgitation. No evidence of mitral stenosis.  4. The aortic valve was not well visualized. Aortic valve regurgitation  is trivial. No aortic stenosis is present.  5. Aortic dilatation noted. 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  6. The inferior vena cava is dilated in size with <50% respiratory  variability, suggesting right atrial pressure of 15 mmHg.   Patient Profile     59 y.o. male with hypertension, tobacco abuse, and limited medical care admitted with exertional dyspnea and found to have hypoxic respiratory failure, acute systolic heart failure and likely metastatic lung cancer.   Assessment & Plan    # Acute systolic heart failure:  LVEF 40-45%.  Newly diagnosed this admission.  He is euvolemic on exam.  Nonobstructive disease on cath.  He is hyponatremic.  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  We will increase metoprolol to 50 mg twice daily today and consolidate to 100 mg of succinate tomorrow.  Add an ARB or Entresto as blood pressure tolerates.  # Non-obstructive CAD:  Cath revealed moderate diffuse LAD stenosis and 40% D2  lesion.  Manage medically.Continue atorvastatin.  He does have mild transaminitis.  This will need to be monitored over time.  Continue aspirin.  #Anemia: Hemoglobin stable at 11.5.  #Sinus tachycardia: Wife notes that everyone in his family has a high resting heart rate.  Given his systolic dysfunction we will add metoprolol as above.  # Hypoxia: # likely malignancy:  Per primary team.  Breathing improved with thoracentesis.  For questions or updates, please contact [place] Please consult [email or web address] for contact info under        Signed, Skeet Latch, MD  10/07/2020, 11:25 AM

## 2020-10-08 ENCOUNTER — Inpatient Hospital Stay (HOSPITAL_COMMUNITY): Payer: Self-pay

## 2020-10-08 ENCOUNTER — Inpatient Hospital Stay (HOSPITAL_COMMUNITY): Payer: Self-pay | Admitting: Certified Registered"

## 2020-10-08 ENCOUNTER — Encounter (HOSPITAL_COMMUNITY)
Admission: EM | Disposition: A | Discharge status: Hospice | Payer: Self-pay | Source: Home / Self Care | Attending: Internal Medicine

## 2020-10-08 ENCOUNTER — Other Ambulatory Visit: Payer: Self-pay

## 2020-10-08 ENCOUNTER — Encounter (HOSPITAL_COMMUNITY): Payer: Self-pay | Admitting: Internal Medicine

## 2020-10-08 HISTORY — PX: BRONCHIAL BRUSHINGS: SHX5108

## 2020-10-08 HISTORY — PX: VIDEO BRONCHOSCOPY: SHX5072

## 2020-10-08 HISTORY — PX: HEMOSTASIS CONTROL: SHX6838

## 2020-10-08 HISTORY — PX: BRONCHIAL BIOPSY: SHX5109

## 2020-10-08 LAB — CBC WITH DIFFERENTIAL/PLATELET
Abs Immature Granulocytes: 0.15 10*3/uL — ABNORMAL HIGH (ref 0.00–0.07)
Basophils Absolute: 0 10*3/uL (ref 0.0–0.1)
Basophils Relative: 0 %
Eosinophils Absolute: 0 10*3/uL (ref 0.0–0.5)
Eosinophils Relative: 0 %
HCT: 37 % — ABNORMAL LOW (ref 39.0–52.0)
Hemoglobin: 11.5 g/dL — ABNORMAL LOW (ref 13.0–17.0)
Immature Granulocytes: 1 %
Lymphocytes Relative: 5 %
Lymphs Abs: 0.6 10*3/uL — ABNORMAL LOW (ref 0.7–4.0)
MCH: 24.6 pg — ABNORMAL LOW (ref 26.0–34.0)
MCHC: 31.1 g/dL (ref 30.0–36.0)
MCV: 79.1 fL — ABNORMAL LOW (ref 80.0–100.0)
Monocytes Absolute: 1.4 10*3/uL — ABNORMAL HIGH (ref 0.1–1.0)
Monocytes Relative: 11 %
Neutro Abs: 10.7 10*3/uL — ABNORMAL HIGH (ref 1.7–7.7)
Neutrophils Relative %: 83 %
Platelets: 244 10*3/uL (ref 150–400)
RBC: 4.68 MIL/uL (ref 4.22–5.81)
RDW: 16.9 % — ABNORMAL HIGH (ref 11.5–15.5)
WBC: 12.9 10*3/uL — ABNORMAL HIGH (ref 4.0–10.5)
nRBC: 0 % (ref 0.0–0.2)

## 2020-10-08 LAB — PHOSPHORUS: Phosphorus: 3.2 mg/dL (ref 2.5–4.6)

## 2020-10-08 LAB — MAGNESIUM: Magnesium: 1.9 mg/dL (ref 1.7–2.4)

## 2020-10-08 LAB — SURGICAL PCR SCREEN
MRSA, PCR: NEGATIVE
Staphylococcus aureus: NEGATIVE

## 2020-10-08 SURGERY — BRONCHOSCOPY, WITH FLUOROSCOPY
Anesthesia: General

## 2020-10-08 MED ORDER — LIDOCAINE 2% (20 MG/ML) 5 ML SYRINGE
INTRAMUSCULAR | Status: DC | PRN
  Administered 2020-10-08: 60 mg via INTRAVENOUS

## 2020-10-08 MED ORDER — LACTATED RINGERS IV SOLN: INTRAVENOUS | Status: DC

## 2020-10-08 MED ORDER — MUPIROCIN 2 % EX OINT
1.0000 "application " | TOPICAL_OINTMENT | Freq: Two times a day (BID) | CUTANEOUS | Status: AC
  Administered 2020-10-08 – 2020-10-12 (×8): 1 via NASAL
  Filled 2020-10-08 (×2): qty 22

## 2020-10-08 MED ORDER — ONDANSETRON HCL 4 MG/2ML IJ SOLN
INTRAMUSCULAR | Status: DC | PRN
  Administered 2020-10-08: 4 mg via INTRAVENOUS

## 2020-10-08 MED ORDER — PHENYLEPHRINE 40 MCG/ML (10ML) SYRINGE FOR IV PUSH (FOR BLOOD PRESSURE SUPPORT)
PREFILLED_SYRINGE | INTRAVENOUS | Status: DC | PRN
  Administered 2020-10-08: 120 ug via INTRAVENOUS

## 2020-10-08 MED ORDER — HYDROMORPHONE HCL 1 MG/ML IJ SOLN: 0.2500 mg | INTRAMUSCULAR | Status: DC | PRN

## 2020-10-08 MED ORDER — PROPOFOL 10 MG/ML IV BOLUS
INTRAVENOUS | Status: DC | PRN
  Administered 2020-10-08: 150 mg via INTRAVENOUS

## 2020-10-08 MED ORDER — DEXAMETHASONE SODIUM PHOSPHATE 10 MG/ML IJ SOLN
INTRAMUSCULAR | Status: DC | PRN
  Administered 2020-10-08: 5 mg via INTRAVENOUS

## 2020-10-08 MED ORDER — ONDANSETRON HCL 4 MG/2ML IJ SOLN: 4.0000 mg | Freq: Once | INTRAMUSCULAR | Status: DC | PRN

## 2020-10-08 MED ORDER — SODIUM CHLORIDE (PF) 0.9 % IJ SOLN
PREFILLED_SYRINGE | INTRAMUSCULAR | Status: DC | PRN
  Administered 2020-10-08: 2 mL
  Administered 2020-10-08: 4 mL

## 2020-10-08 MED ORDER — ROCURONIUM BROMIDE 10 MG/ML (PF) SYRINGE
PREFILLED_SYRINGE | INTRAVENOUS | Status: DC | PRN
  Administered 2020-10-08: 60 mg via INTRAVENOUS

## 2020-10-08 MED ORDER — SUGAMMADEX SODIUM 200 MG/2ML IV SOLN
INTRAVENOUS | Status: DC | PRN
  Administered 2020-10-08: 200 mg via INTRAVENOUS

## 2020-10-08 MED ORDER — PHENYLEPHRINE HCL-NACL 10-0.9 MG/250ML-% IV SOLN
INTRAVENOUS | Status: DC | PRN
  Administered 2020-10-08: 50 ug/min via INTRAVENOUS

## 2020-10-08 MED ORDER — CHLORHEXIDINE GLUCONATE 0.12 % MT SOLN
OROMUCOSAL | Status: AC
  Administered 2020-10-08: 15 mL
  Filled 2020-10-08: qty 15

## 2020-10-08 MED ORDER — FENTANYL CITRATE (PF) 100 MCG/2ML IJ SOLN
INTRAMUSCULAR | Status: DC | PRN
Start: 2020-10-08 — End: 2020-10-08
  Administered 2020-10-08: 50 ug via INTRAVENOUS

## 2020-10-08 NOTE — Anesthesia Preprocedure Evaluation (Signed)
Anesthesia Evaluation  Patient identified by MRN, date of birth, ID band Patient awake    Reviewed: Patient's Chart, lab work & pertinent test results  Airway Mallampati: II  TM Distance: >3 FB Neck ROM: Full    Dental  (+) Teeth Intact   Pulmonary former smoker,  Pulmonary nodules   Pulmonary exam normal        Cardiovascular negative cardio ROS   Rhythm:Regular Rate:Normal     Neuro/Psych negative neurological ROS  negative psych ROS   GI/Hepatic negative GI ROS, Neg liver ROS, (+)     substance abuse  ,   Endo/Other  negative endocrine ROS  Renal/GU negative Renal ROS  negative genitourinary   Musculoskeletal negative musculoskeletal ROS (+)   Abdominal (+)   Bowel sounds: normal.  Peds  Hematology negative hematology ROS (+)   Anesthesia Other Findings   Reproductive/Obstetrics                             Anesthesia Physical Anesthesia Plan  ASA: II  Anesthesia Plan: General   Post-op Pain Management:    Induction: Intravenous  PONV Risk Score and Plan: 2 and Ondansetron, Dexamethasone, Treatment may vary due to age or medical condition and Midazolam  Airway Management Planned: Mask and Oral ETT  Additional Equipment: None  Intra-op Plan:   Post-operative Plan: Extubation in OR  Informed Consent: I have reviewed the patients History and Physical, chart, labs and discussed the procedure including the risks, benefits and alternatives for the proposed anesthesia with the patient or authorized representative who has indicated his/her understanding and acceptance.     Dental advisory given  Plan Discussed with: CRNA  Anesthesia Plan Comments: (Lab Results      Component                Value               Date                      WBC                      12.9 (H)            10/08/2020                HGB                      11.5 (L)            10/08/2020                 HCT                      37.0 (L)            10/08/2020                MCV                      79.1 (L)            10/08/2020                PLT                      244  10/08/2020           ECHO 09/30/20: Left ventricular ejection fraction, by estimation, is 40 to 45%. The left ventricle has mildly decreased function. The left ventricle demonstrates global hypokinesis. Left ventricular diastolic parameters are indeterminate. 2. Right ventricular systolic function is normal. The right ventricular size is normal. Tricuspid regurgitation signal is inadequate for assessing PA pressure. 3. The mitral valve is normal in structure. No evidence of mitral valve regurgitation. No evidence of mitral stenosis. 4. The aortic valve was not well visualized. Aortic valve regurgitation is trivial. No aortic stenosis is present. 5. Aortic dilatation noted. There is mild dilatation of the ascending aorta, measuring 37 mm. 6. The inferior vena cava is dilated in size with <50% respiratory variability, suggesting right atrial pressure of 15 mmHg. Cath 10/04/20:  Dist LAD lesion is 50% stenosed.  1st RPL lesion is 20% stenosed.  Mid RCA lesion is 25% stenosed.  2nd Diag lesion is 40% stenosed.   1) Pt with nonobstructive CAD  Moderate diffuse mid-LAD stenosis  Moderate diffuse diagonal stenosis  Patent LCx without stenosis  Patent RCA with mild aneurysmal dilatation of the mid-vessel and aneurysmal dilatation of the PLA branch 2) Normal LVEDP )        Anesthesia Quick Evaluation

## 2020-10-08 NOTE — Progress Notes (Signed)
PROGRESS NOTE   Brett Medina  ASN:[id number] DOB: December 16, 1960 DOA: 09/29/2020 PCP: Patient, No Pcp Per  Brief Narrative:  59 year old white male known tobacco abuse admitted with shortness of breath + DOE X 2 months 09/29/2020-CT on admission found to have moderate to large volume left pleural effusion with complete collapse left upper and lower lobes?  Malignancy--troponin elevated 1300 leukocytosis 13.2 Ceftriaxone doxycycline started on admission Cardiology consulted for elevated troponin-status post left thoracentesis 09/30/2020-Echo 11/29 LVEF 40-45% with global hypokinesis   Assessment & Plan:   Principal Problem:   Mass of left lung Active Problems:   Large pleural effusion   Elevated troponin   Demand ischemia (HCC)   Acute systolic heart failure (HCC)   1. Acute respiratory failure with hypoxia status post left thoracentesis 950 cc a. Wean oxygen as tolerated keep Above 92% b. Ambulatory and doing fairly well c. Expect patient may need oxygen on discharge desat screen ordered 2. Elevated troponin secondary to demand ischemia no target lesion previously on heparin 3. Acute systolic heart failure EF 40-45% a. Coronary angiography 12/3 = no obstructive disease and suspected demand ischemia b. Metoprolol increased to XL 100 daily continue labetalol IV as needed --- he has been cleared as above for procedure c. Continue aspirin 81 mg d. Cardiology following peripherally 4. Persistent sinus tachycardia/?  PE a. Cannot rule out as patient refuses contrast-bilateral LE duplex negative for DVT b. Apparently heparin was discontinued on 12/4 and change to prophylactic lovenox dosing by Dr. Jerilee Field duplex was negative for DVT so lower risk 5. Large probably exudative left pleural effusion a. Cytology from 11/29 is pending defer to pulmonology-possible EBUS per them b. CXR repeat 12/7 shows almost complete opacification of the left lung fields and is worse--- I have sent a message  to pulmonology to document if there is a role for Pleurx catheter c. Continue oxygen 6. Leukocytosis a. Doubt that this is secondary to pneumonia 7. AKI on admission resolved a. Improving-monitor trends 8. Enterococcus 1000 colony-forming units-not a UTI a. Patient placed on amoxicillin 500 every 8 since 12/4 b. Circumscribed course until 12/7 and discontinued subsequently  DVT prophylaxis: Heparin Code Status: Full Family Communication: None Disposition:  Status is: Inpatient  Remains inpatient appropriate because:Hemodynamically unstable and Ongoing diagnostic testing needed not appropriate for outpatient work up   Dispo:  Patient From: Home  Planned Disposition: Home with Health Care Svc  Expected discharge date: Do not anticipate discharge before 12/10  Medically stable for discharge: No    Consultants:   Pulmonary  Cardiology  Procedures: Conclusion from 12/3    Dist LAD lesion is 50% stenosed.  1st RPL lesion is 20% stenosed.  Mid RCA lesion is 25% stenosed.  2nd Diag lesion is 40% stenosed.   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 No severe lesions or culprit for NSTEMI - suspect demand ischemia   Consultants:  PCCM Cardiology IR    Procedures/Significant Events:  11/30  LEFT thoracentesis aspirated 950 cc fluid 11/30 PCXR;Increased size of large left pleural effusion with decreased left upper lung aeration. -Right perihilar/basilar opacities are grossly unchanged 12/1 LEFT thoracentesis; aspirated 643ml maroon-colored fluid  Antimicrobials: Amoxicillin   Subjective:  Awake alert sitting up in bed n.p.o. for procedure and ready to go home No  chest pain no fever No significant shortness of  breath Wife at bedside  Objective: Vitals:   10/08/20 0339 10/08/20 0523 10/08/20 0843 10/08/20 0938  BP: (!) 131/100  (!) 136/99 (!) 147/115  Pulse:   (!) 115 (!) 120  Resp: 19  20 18   Temp: 99 F (37.2 C)  99 F (37.2 C) 99 F (37.2 C)  TempSrc: Oral  Oral   SpO2: 97%  96%   Weight:  89.3 kg    Height:        Intake/Output Summary (Last 24 hours) at 10/08/2020 1017 Last data filed at 10/07/2020 2200 Gross per 24 hour  Intake 120 ml  Output 600 ml  Net -480 ml   Filed Weights   10/05/20 0300 10/06/20 0242 10/08/20 0523  Weight: 90.6 kg 90.5 kg 89.3 kg    Examination: EOMI NCAT neck soft supple S1-S2 sinus, sinus tach on monitors Air entry is decreased on the left side compared to the right and dull percussion note on left side fremitus Abdomen is obese nontender He has hyperkeratosis to lower extremities and slight pitting edema   Data Reviewed: I have personally reviewed following labs and imaging studies  White count down from 16.5-->10.9-->12.9 Hemoglobin 11.2-->11.5  Radiology Studies: DG Chest 2 View  Result Date: 10/08/2020 CLINICAL DATA:  Pleural effusion. EXAM: CHEST - 2 VIEW COMPARISON:  10/02/2020. FINDINGS: Worsening, now complete opacification of the left lung. Similar right perihilar and right basilar opacities. Cardiomediastinal silhouette is largely obscured. No acute osseous abnormality. No visible pneumothorax. IMPRESSION: 1. Worsening, now complete opacification of the left lung that is concerning for a combination of pleural effusion and consolidation. 2. Similar right perihilar and right basilar opacities. Electronically Signed   By: Margaretha Sheffield MD   On: 10/08/2020 07:59     Scheduled Meds: . amoxicillin  500 mg Oral Q8H  . aspirin EC  81 mg Oral Daily  . atorvastatin  20 mg Oral Daily  . mouth rinse  15 mL Mouth Rinse BID  . metoprolol succinate  100 mg Oral Daily  . mupirocin ointment  1 application Nasal BID  . sodium chloride  flush  3 mL Intravenous Q12H  . sodium chloride flush  3 mL Intravenous Q12H   Continuous Infusions: . sodium chloride       LOS: 9 days    Time spent: Martin, MD [facility] To contact the attending provider between 7A-7P or the covering provider during after hours 7P-7A, please log into the web site [email or web address] and access using universal [place] password for that web site. If you do not have the password, please call the hospital operator.  10/08/2020, 10:17 AM

## 2020-10-08 NOTE — Op Note (Signed)
[facility] Cardiopulmonary Patient Name: Brett Medina Pocedure Date: 10/08/2020 MRN: [id number] Attending MD: Collene Gobble , MD Date of Birth: 11-26-1960 CSN: Finalized Age: 59 Admit Type: Inpatient Gender: Male Procedure:             Bronchoscopy Indications:           Left upper lobe mass Providers:             Collene Gobble, MD, Vista Lawman, RN, Grace Isaac, RN Referring MD:           Medicines:             Epinephrine 1 mg/10 mL topical 6 mL Complications:         No immediate complications Estimated Blood Loss:  Estimated blood loss was minimal. Procedure:             Pre-Anesthesia Assessment:                        - A History and Physical has been performed. Patient                         meds and allergies have been reviewed. The risks and                         benefits of the procedure and the sedation options and                         risks were discussed with the patient. All questions                         were answered and informed consent was obtained.                         Patient identification and proposed procedure were                         verified prior to the procedure by the physician in                         the pre-procedure area. Mental Status Examination:                         normal. Airway Examination: normal oropharyngeal                         airway. Respiratory Examination: poor air movement in                         the left lung. CV Examination: normal. Prior                         Anticoagulants: The patient has taken no previous                         anticoagulant or antiplatelet agents. ASA Grade                         Assessment: III - A patient with severe systemic  disease. After reviewing the risks and benefits, the                         patient was deemed in satisfactory condition to                         undergo the procedure. The anesthesia plan was to use                          general anesthesia. Immediately prior to                         administration of medications, the patient was                         re-assessed for adequacy to receive sedatives. The                         heart rate, respiratory rate, oxygen saturations,                         blood pressure, adequacy of pulmonary ventilation, and                         response to care were monitored throughout the                         procedure. The physical status of the patient was                         re-assessed after the procedure.                        After obtaining informed consent, the bronchoscope was                         passed under direct vision. Throughout the procedure,                         the patient's blood pressure, pulse, and oxygen                         saturations were monitored continuously. the                         [place] [id number] was introduced through the                         mouth, via the endotracheal tube (the patient was                         intubated for the procedure) and advanced to the                         tracheobronchial tree. The procedure was accomplished                         without difficulty. The patient tolerated the  procedure well. Scope In: Scope Out: Findings:      The trachea was displaced rightward by extrensic compression. The carina       is sharp. The tracheobronchial tree of the right lung was examined to at       least the first subsegmental level. Bronchial mucosa and anatomy in the       right lung are normal; there are no endobronchial lesions, and no       secretions.      Left Lung Abnormalities: The entire left side was erythematous,       edematous and irritable. A completely obstructing mass was found       proximally in the left upper lobe airway. The mass was exophytic,       fungating and necrotic. Endobronchial biopsies and brushings were       performed on  the LUL endobronchial lesion. The lesion was not traversed.       There was abnother polypoid erythematous lesion eminating from the wall       of the LUL carina into the LLL airway. This polypoid lesion was treated       with 6cc epinephrine 1:10000 dil and then biopsied and debrided away to       reveal the LLL segmental airways. The scope was able to pass to the LLL       segmental level after debridement. Impression:            - Left upper lobe mass                        - The airway examination of the right lung was normal.                        - An exophytic, fungating and necrotic mass was found                         in the left upper lobe airway and a polypoid lesion                         extending into and obstructing the left lower lobe                         airway. The lesions are likely malignant.                        - Endobronchial biopsies performed LUL and LLL masses.                        - Endobronchial brushings performed LUL mass. Moderate Sedation:      Performed under general anesthesia Recommendation:        - Await biopsy and brushing results. Procedure Code(s):     --- Professional ---                        [phone], Bronchoscopy, rigid or flexible, including                         fluoroscopic guidance, when performed; diagnostic,                         with cell washing,  when performed (separate procedure) Diagnosis Code(s):     --- Professional ---                        R91.8, Other nonspecific abnormal finding of lung field                        J98.9, Respiratory disorder, unspecified CPT copyright 2019 American Medical Association. All rights reserved. The codes documented in this report are preliminary and upon coder review may  be revised to meet current compliance requirements. Collene Gobble, MD Collene Gobble, MD 10/08/2020 2:55:00 PM Number of Addenda: 0

## 2020-10-08 NOTE — Transfer of Care (Signed)
Immediate Anesthesia Transfer of Care Note  Patient: Brett Medina  Procedure(s) Performed: VIDEO BRONCHOSCOPY WITH FLUORO (N/A ) BRONCHIAL BIOPSIES BRONCHIAL BRUSHINGS HEMOSTASIS CONTROL  Patient Location: Endoscopy Unit  Anesthesia Type:General  Level of Consciousness: drowsy and patient cooperative  Airway & Oxygen Therapy: Patient Spontanous Breathing and Patient connected to face mask oxygen  Post-op Assessment: Report given to RN  Post vital signs: Reviewed and stable  Last Vitals:  Vitals Value Taken Time  BP 141/91 10/08/20 1456  Temp    Pulse 67 10/08/20 1458  Resp 26 10/08/20 1458  SpO2 96 % 10/08/20 1458  Vitals shown include unvalidated device data.  Last Pain:  Vitals:   10/08/20 0938  TempSrc: Oral  PainSc: 0-No pain      Patients Stated Pain Goal: 0 (76/18/48 [id number])  Complications: No complications documented.

## 2020-10-08 NOTE — Interval H&P Note (Signed)
History and Physical Interval Note:  10/08/2020 11:42 AM  Brett Medina  has presented today for surgery, with the diagnosis of pulmonary nodules.  The various methods of treatment have been discussed with the patient and family. After consideration of risks, benefits and other options for treatment, the patient has consented to  Procedure(s): VIDEO BRONCHOSCOPY WITH FLUORO (N/A) as a surgical intervention.  The patient's history has been reviewed, patient examined, no change in status, stable for surgery.  I have reviewed the patient's chart and labs.  Questions were answered to the patient's satisfaction.     Collene Gobble

## 2020-10-08 NOTE — Anesthesia Procedure Notes (Signed)
Procedure Name: Intubation Date/Time: 10/08/2020 2:10 PM Performed by: Barrington Ellison, CRNA Pre-anesthesia Checklist: Patient identified, Emergency Drugs available, Suction available and Patient being monitored Patient Re-evaluated:Patient Re-evaluated prior to induction Oxygen Delivery Method: Circle System Utilized Preoxygenation: Pre-oxygenation with 100% oxygen Induction Type: IV induction Ventilation: Mask ventilation without difficulty Laryngoscope Size: Mac and 4 Grade View: Grade I Tube type: Oral Tube size: 8.5 mm Number of attempts: 1 Airway Equipment and Method: Stylet and Oral airway Placement Confirmation: ETT inserted through vocal cords under direct vision,  positive ETCO2 and breath sounds checked- equal and bilateral Secured at: 23 cm Tube secured with: Tape Dental Injury: Teeth and Oropharynx as per pre-operative assessment

## 2020-10-09 ENCOUNTER — Inpatient Hospital Stay (HOSPITAL_COMMUNITY): Payer: Self-pay

## 2020-10-09 ENCOUNTER — Encounter (HOSPITAL_COMMUNITY): Payer: Self-pay | Admitting: Emergency Medicine

## 2020-10-09 LAB — CBC WITH DIFFERENTIAL/PLATELET
Abs Immature Granulocytes: 0.14 10*3/uL — ABNORMAL HIGH (ref 0.00–0.07)
Basophils Absolute: 0 10*3/uL (ref 0.0–0.1)
Basophils Relative: 0 %
Eosinophils Absolute: 0 10*3/uL (ref 0.0–0.5)
Eosinophils Relative: 0 %
HCT: 36.2 % — ABNORMAL LOW (ref 39.0–52.0)
Hemoglobin: 11.1 g/dL — ABNORMAL LOW (ref 13.0–17.0)
Immature Granulocytes: 1 %
Lymphocytes Relative: 4 %
Lymphs Abs: 0.5 10*3/uL — ABNORMAL LOW (ref 0.7–4.0)
MCH: 24.3 pg — ABNORMAL LOW (ref 26.0–34.0)
MCHC: 30.7 g/dL (ref 30.0–36.0)
MCV: 79.4 fL — ABNORMAL LOW (ref 80.0–100.0)
Monocytes Absolute: 1.3 10*3/uL — ABNORMAL HIGH (ref 0.1–1.0)
Monocytes Relative: 10 %
Neutro Abs: 10.9 10*3/uL — ABNORMAL HIGH (ref 1.7–7.7)
Neutrophils Relative %: 85 %
Platelets: 183 10*3/uL (ref 150–400)
RBC: 4.56 MIL/uL (ref 4.22–5.81)
RDW: 17 % — ABNORMAL HIGH (ref 11.5–15.5)
WBC: 12.9 10*3/uL — ABNORMAL HIGH (ref 4.0–10.5)
nRBC: 0 % (ref 0.0–0.2)

## 2020-10-09 LAB — COMPREHENSIVE METABOLIC PANEL
ALT: 41 U/L (ref 0–44)
AST: 44 U/L — ABNORMAL HIGH (ref 15–41)
Albumin: 2 g/dL — ABNORMAL LOW (ref 3.5–5.0)
Alkaline Phosphatase: 177 U/L — ABNORMAL HIGH (ref 38–126)
Anion gap: 13 (ref 5–15)
BUN: 21 mg/dL — ABNORMAL HIGH (ref 6–20)
CO2: 22 mmol/L (ref 22–32)
Calcium: 8.5 mg/dL — ABNORMAL LOW (ref 8.9–10.3)
Chloride: 101 mmol/L (ref 98–111)
Creatinine, Ser: 1.33 mg/dL — ABNORMAL HIGH (ref 0.61–1.24)
GFR, Estimated: >60 mL/min (ref >60)
Glucose, Bld: 103 mg/dL — ABNORMAL HIGH (ref 70–99)
Potassium: 4.4 mmol/L (ref 3.5–5.1)
Sodium: 136 mmol/L (ref 135–145)
Total Bilirubin: 1.3 mg/dL — ABNORMAL HIGH (ref 0.3–1.2)
Total Protein: 5.4 g/dL — ABNORMAL LOW (ref 6.5–8.1)

## 2020-10-09 LAB — MAGNESIUM: Magnesium: 2.1 mg/dL (ref 1.7–2.4)

## 2020-10-09 LAB — PHOSPHORUS: Phosphorus: 3.8 mg/dL (ref 2.5–4.6)

## 2020-10-09 MED ORDER — ENOXAPARIN SODIUM 40 MG/0.4ML ~~LOC~~ SOLN
40.0000 mg | SUBCUTANEOUS | Status: DC
  Administered 2020-10-09 – 2020-10-15 (×7): 40 mg via SUBCUTANEOUS
  Filled 2020-10-09 (×7): qty 0.4

## 2020-10-09 MED ORDER — LORAZEPAM 2 MG/ML IJ SOLN
1.0000 mg | Freq: Once | INTRAMUSCULAR | Status: AC
  Administered 2020-10-09: 1 mg via INTRAVENOUS
  Filled 2020-10-09: qty 1

## 2020-10-09 NOTE — Progress Notes (Signed)
PROGRESS NOTE    Brett Medina  MWN:[id number] DOB: 19-Nov-1960 DOA: 09/29/2020 PCP: Patient, No Pcp Per     Brief Narrative:  Brett Green Brett a 59 Medina PMHx Tobacco abuse.  Presented to the ED with complaints of difficulty breathing ongoing over the past 15month at least. Reports he was seen at urgent care several was given prednisone. Patient quit smoking cigarettes about 2 months ago. Has chronic unchanged bilateral lower extremity swelling. Reports a productive cough over the past few weeks. No family history of heart attacks. In the ED, O2 sats 91 to 97% on room air. Tachycardic to 135, mild tachypnea to 22.Lactic acid 1.3. WBC 13.2. Troponin 1087. UA with moderate hemoglobin. Chest CT-Constellation of findings consistent with metastatic malignancy, moderate to large volume left pleural effusion with almost complete collapse of left upper and lower lobes. Patient reports contrast allergy, he refused CT with contrast to rule out PE as he wastachycardic. Cardiology was consulted for elevated troponin, following. S/P left thoracentesis on 09/30/20 by IR 950 cc of dark bloody fluid removed. 2D echo 11/29 showing reduced LVEF 40-45% with global left ventricle hypokinesis.  B/L LE duplex [place] for DVT.  On 1130/21, chest x-ray showed recurrent large left pleural effusion, IR consulted for repeat left thoracentesis.  PCCM on board, recommend bronchoscopy, biopsies taken, await results.    Today, met wife at bedside, who reports patient is intermittently confused, patient denies any new complaints, denies any worsening shortness of breath, reports breathing is better.  Denies any chest pain, abdominal pain, nausea/vomiting, fever/chills.  Noted to have right upper and lower extremity weakness while walking with PT   Assessment & Plan:   Principal Problem:   Mass of left lung Active Problems:   Large pleural effusion   Elevated troponin   Demand ischemia (HCC)   Acute  systolic heart failure (HCC)   Acute respiratory failure with hypoxia Large left pleural effusion ??Possible lung malignancy s/p bronchoscopy Multifactorial, likely 2/2 left pleural effusion, s/p left thoracentesis draining about 950 cc of fluid, exudative in nature PCCM on board, s/p EBUS/bronchoscopy, likely lung malignancy, await biopsy report Continue Xopenex Supplemental O2, I-S, flutter valve  Acute systolic CHF Elevated troponin, suspect demand ischemia 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% on 2D echo 11/29 LHC revealed moderate diffuse LAD stenosis and 40% D2 lesion, medical management LDL 85 Cardiology on board Continue aspirin, Lipitor, metoprolol, losartan Daily weights, start strict I&O Monitor closely  Acute metabolic encephalopathy Ongoing intermittent confusion CT head showed concern for potential metastatic focus Will order MRI brain due to intermittent confusion, and to rule out CVA  Persistent sinus tachycardia TSH nornal B/L LE duplex [place] for DVT  Leukocytosis, likely reactive Afebrile Currently not on antibiotics Continue to monitor fever curve and WBC  AKI Ongoing Daily BMP  ?UTI Enterococcus faecalis UC grew 1000 colony Completed antibiotics (12/4-->12/7)      DVT prophylaxis: Lovenox Code Status: Full Family Communication: Discussed with wife at bedside on 10/09/2020  Status is: Inpatient    Dispo: The patient is from: Home              Anticipated d/c is to: Home              Anticipated d/c date is: TBD              Patient currently unstable      Consultants:  PCCM Cardiology IR    Procedures/Significant Events:  11/30  LEFT thoracentesis aspirated 950 cc  fluid 11/30 PCXR;Increased size of large left pleural effusion with decreased left upper lung aeration. -Right perihilar/basilar opacities are grossly unchanged 12/1 LEFT thoracentesis; aspirated 656m maroon-colored fluid 12/3 LEFT heart cath;1) Pt with  nonobstructive CAD  Moderate diffuse mid-LAD stenosis  Moderate diffuse diagonal stenosis  Patent LCx without stenosis  Patent RCA with mild aneurysmal dilatation of the mid-vessel and aneurysmal dilatation of the PLA branch 2) Normal LVEDP    Cultures 11/28 urine positive Enterococcus faecalis 11/29 LEFT thoracentesis NGTD    Antimicrobials: Anti-infectives (From admission, onward)   Start     Ordered Stop   10/05/20 1615  amoxicillin (AMOXIL) capsule 500 mg        10/05/20 1528     10/05/20 1445  cefTRIAXone (ROCEPHIN) 2 g in sodium chloride 0.9 % 100 mL IVPB  Status:  Discontinued        10/05/20 1354 10/05/20 1527   09/29/20 1630  cefTRIAXone (ROCEPHIN) 2 g in sodium chloride 0.9 % 100 mL IVPB  Status:  Discontinued        09/29/20 1624 09/29/20 2226   09/29/20 1630  azithromycin (ZITHROMAX) 500 mg in sodium chloride 0.9 % 250 mL IVPB  Status:  Discontinued        09/29/20 1624 09/29/20 2226         Continuous Infusions: . sodium chloride       Objective: Vitals:   10/09/20 0323 10/09/20 0357 10/09/20 0855 10/09/20 1234  BP: (!) 143/92   (!) 117/94  Pulse:  100 99 100  Resp: 18 18 16 20   Temp: 98.6 F (37 C)  98.8 F (37.1 C) 97.6 F (36.4 C)  TempSrc: Oral  Oral Oral  SpO2: 98% 100%  98%  Weight:  87.5 kg    Height:        Intake/Output Summary (Last 24 hours) at 10/09/2020 1506 Last data filed at 10/09/2020 1431 Gross per 24 hour  Intake 120 ml  Output 400 ml  Net -280 ml   Filed Weights   10/06/20 0242 10/08/20 0523 10/09/20 0357  Weight: 90.5 kg 89.3 kg 87.5 kg   Physical Exam:  General: NAD, intermittent confusion  Cardiovascular: S1, S2 present  Respiratory: CTAB  Abdomen: Soft, nontender, nondistended, bowel sounds present  Musculoskeletal: No bilateral pedal edema noted  Skin: Normal  Psychiatry: Normal mood   CBC: Recent Labs  Lab 10/05/20 0115 10/06/20 0206 10/07/20 0234 10/08/20 0121 10/09/20 0208  WBC 16.5*  11.1* 10.9* 12.9* 12.9*  NEUTROABS 14.1* 9.1* 8.7* 10.7* 10.9*  HGB 11.2* 11.4* 11.5* 11.5* 11.1*  HCT 36.6* 35.7* 37.2* 37.0* 36.2*  MCV 79.6* 78.5* 79.1* 79.1* 79.4*  PLT 300 324 255 244 1093  Basic Metabolic Panel: Recent Labs  Lab 10/04/20 0154 10/04/20 0154 10/05/20 0115 10/06/20 0206 10/07/20 0234 10/08/20 0121 10/09/20 0208  NA 133*  --  136 134* 132*  --  136  K 4.6  --  5.1 4.1 4.2  --  4.4  CL 102  --  101 101 99  --  101  CO2 22  --  28 24 24   --  22  GLUCOSE 144*  --  146* 119* 94  --  103*  BUN 14  --  21* 21* 18  --  21*  CREATININE 1.21  --  1.39* 1.26* 1.19  --  1.33*  CALCIUM 8.9  --  8.9 8.7* 8.5*  --  8.5*  MG 2.0   < > 2.2 1.8 1.8 1.9  2.1  PHOS 4.3   < > 4.2 4.1 3.5 3.2 3.8   < > = values in this interval not displayed.   GFR: Estimated Creatinine Clearance: 66.3 mL/min (A) (by C-G formula based on SCr of 1.33 mg/dL (H)). Liver Function Tests: Recent Labs  Lab 10/04/20 0154 10/05/20 0115 10/06/20 0206 10/07/20 0234 10/09/20 0208  AST 40 46* 53* 40 44*  ALT 27 38 55* 49* 41  ALKPHOS 106 149* 175* 224* 177*  BILITOT 0.5 0.4 0.6 0.8 1.3*  PROT 5.6* 5.1* 5.5* 5.1* 5.4*  ALBUMIN 2.1* 1.9* 2.1* 2.0* 2.0*   No results for input(s): LIPASE, AMYLASE in the last 168 hours. No results for input(s): AMMONIA in the last 168 hours. Coagulation Profile: No results for input(s): INR, PROTIME in the last 168 hours. Cardiac Enzymes: No results for input(s): CKTOTAL, CKMB, CKMBINDEX, TROPONINI in the last 168 hours. BNP (last 3 results) No results for input(s): PROBNP in the last 8760 hours. HbA1C: No results for input(s): HGBA1C in the last 72 hours. CBG: No results for input(s): GLUCAP in the last 168 hours. Lipid Profile: No results for input(s): CHOL, HDL, LDLCALC, TRIG, CHOLHDL, LDLDIRECT in the last 72 hours. Thyroid Function Tests: No results for input(s): TSH, T4TOTAL, FREET4, T3FREE, THYROIDAB in the last 72 hours. Anemia Panel: No results for  input(s): VITAMINB12, FOLATE, FERRITIN, TIBC, IRON, RETICCTPCT in the last 72 hours. Sepsis Labs: No results for input(s): PROCALCITON, LATICACIDVEN in the last 168 hours.  Recent Results (from the past 240 hour(s))  Blood Culture (routine x 2)     Status: None   Collection Time: 09/29/20  4:35 PM   Specimen: BLOOD LEFT HAND  Result Value Ref Range Status   Specimen Description BLOOD LEFT HAND  Final   Special Requests   Final    BOTTLES DRAWN AEROBIC AND ANAEROBIC Blood Culture adequate volume   Culture   Final    NO GROWTH 5 DAYS Performed at [facility], [address], [place], [place] [address]    Report Status 10/04/2020 FINAL  Final  Blood Culture (routine x 2)     Status: None   Collection Time: 09/29/20  4:45 PM   Specimen: BLOOD RIGHT HAND  Result Value Ref Range Status   Specimen Description BLOOD RIGHT HAND  Final   Special Requests   Final    BOTTLES DRAWN AEROBIC AND ANAEROBIC Blood Culture results may not be optimal due to an inadequate volume of blood received in culture bottles   Culture   Final    NO GROWTH 5 DAYS Performed at [facility], [address] [place], [place] [address]    Report Status 10/04/2020 FINAL  Final  Resp Panel by RT-PCR (Flu A&B, Covid) Nasopharyngeal Swab     Status: None   Collection Time: 09/29/20  5:07 PM   Specimen: Nasopharyngeal Swab; Nasopharyngeal(NP) swabs in vial transport medium  Result Value Ref Range Status   SARS Coronavirus 2 by RT PCR NEGATIVE NEGATIVE Final    Comment: (NOTE) SARS-CoV-2 target nucleic acids are NOT DETECTED.  The SARS-CoV-2 RNA is generally detectable in upper respiratory specimens during the acute phase of infection. The lowest concentration of SARS-CoV-2 viral copies this assay can detect is 138 copies/mL. A negative result does not preclude SARS-Cov-2 infection and should not be used as the sole basis for treatment or other patient management decisions. A negative result may occur with  improper  specimen collection/handling, submission of specimen other than nasopharyngeal swab, presence  of viral mutation(s) within the areas targeted by this assay, and inadequate number of viral copies(<138 copies/mL). A negative result must be combined with clinical observations, patient history, and epidemiological information. The expected result is Negative.  Fact Sheet for Patients:  EntrepreneurPulse.com.au  Fact Sheet for Healthcare Providers:  IncredibleEmployment.be  This test is no t yet approved or cleared by the [place] FDA and  has been authorized for detection and/or diagnosis of SARS-CoV-2 by FDA under an Emergency Use Authorization (EUA). This EUA will remain  in effect (meaning this test can be used) for the duration of the COVID-19 declaration under Section 564(b)(1) of the Act, 21 U.S.C.section 360bbb-3(b)(1), unless the authorization is terminated  or revoked sooner.       Influenza A by PCR NEGATIVE NEGATIVE Final   Influenza B by PCR NEGATIVE NEGATIVE Final    Comment: (NOTE) The Xpert Xpress SARS-CoV-2/FLU/RSV plus assay is intended as an aid in the diagnosis of influenza from Nasopharyngeal swab specimens and should not be used as a sole basis for treatment. Nasal washings and aspirates are unacceptable for Xpert Xpress SARS-CoV-2/FLU/RSV testing.  Fact Sheet for Patients: EntrepreneurPulse.com.au  Fact Sheet for Healthcare Providers: IncredibleEmployment.be  This test is not yet approved or cleared by the [place] FDA and has been authorized for detection and/or diagnosis of SARS-CoV-2 by FDA under an Emergency Use Authorization (EUA). This EUA will remain in effect (meaning this test can be used) for the duration of the COVID-19 declaration under Section 564(b)(1) of the Act, 21 U.S.C. section 360bbb-3(b)(1), unless the authorization is terminated or revoked.  Performed at  [facility], [address] [place], [place] [address]   Urine culture     Status: Abnormal   Collection Time: 09/29/20  5:51 PM   Specimen: In/Out Cath Urine  Result Value Ref Range Status   Specimen Description   Final    IN/OUT CATH URINE Performed at [facility], [address], [place], [place] [address]    Special Requests   Final    NONE Performed at [facility], [address] [place], [place] [address]    Culture 1,000 COLONIES/mL ENTEROCOCCUS FAECALIS (A)  Final   Report Status 10/03/2020 FINAL  Final   Organism ID, Bacteria ENTEROCOCCUS FAECALIS (A)  Final      Susceptibility   Enterococcus faecalis - MIC*    AMPICILLIN <=2 SENSITIVE Sensitive     NITROFURANTOIN <=16 SENSITIVE Sensitive     VANCOMYCIN 2 SENSITIVE Sensitive     * 1,000 COLONIES/mL ENTEROCOCCUS FAECALIS  Body fluid culture     Status: None   Collection Time: 09/30/20  9:38 AM   Specimen: Lung, Left; Pleural Fluid  Result Value Ref Range Status   Specimen Description PLEURAL FLUID  Final   Special Requests LEFT LUNG  Final   Gram Stain   Final    ABUNDANT WBC PRESENT,BOTH PMN AND MONONUCLEAR NO ORGANISMS SEEN    Culture   Final    NO GROWTH 3 DAYS Performed at [place] [address] [place], [place] [address]    Report Status 10/03/2020 FINAL  Final  Acid Fast Smear (AFB)     Status: None   Collection Time: 09/30/20  9:38 AM   Specimen: Lung, Left; Pleural Fluid  Result Value Ref Range Status   AFB Specimen Processing Concentration  Final   Acid Fast Smear Negative  Final    Comment: (NOTE) Performed At: [place],  [place] [id number] Rush Farmer MD [id number]    Source (AFB) PLEURAL  Final    Comment: FLUID LEFT LUNG Performed at [facility], [place] [address] [place], [place] [address]   Surgical PCR screen     Status: None   Collection Time: 10/08/20  8:51 AM   Specimen: Nasal Mucosa; Nasal Swab  Result Value Ref Range  Status   MRSA, PCR NEGATIVE NEGATIVE Final   Staphylococcus aureus NEGATIVE NEGATIVE Final    Comment: (NOTE) The Xpert SA Assay (FDA approved for NASAL specimens in patients 74 years of age and older), is one component of a comprehensive surveillance program. It is not intended to diagnose infection nor to guide or monitor treatment. Performed at [facility], [place] [address] [place], [place] [address]          Radiology Studies: DG Chest 2 View  Result Date: 10/08/2020 CLINICAL DATA:  Pleural effusion. EXAM: CHEST - 2 VIEW COMPARISON:  10/02/2020. FINDINGS: Worsening, now complete opacification of the left lung. Similar right perihilar and right basilar opacities. Cardiomediastinal silhouette is largely obscured. No acute osseous abnormality. No visible pneumothorax. IMPRESSION: 1. Worsening, now complete opacification of the left lung that is concerning for a combination of pleural effusion and consolidation. 2. Similar right perihilar and right basilar opacities. Electronically Signed   By: Margaretha Sheffield MD   On: 10/08/2020 07:59        Scheduled Meds: . aspirin EC  81 mg Oral Daily  . atorvastatin  20 mg Oral Daily  . enoxaparin (LOVENOX) injection  40 mg Subcutaneous Q24H  . mouth rinse  15 mL Mouth Rinse BID  . metoprolol succinate  100 mg Oral Daily  . mupirocin ointment  1 application Nasal BID  . sodium chloride flush  3 mL Intravenous Q12H  . sodium chloride flush  3 mL Intravenous Q12H   Continuous Infusions: . sodium chloride       LOS: 10 days        Alma Friendly, MD [facility]   If 7PM-7AM, please contact night-coverage 10/09/2020, 3:06 PM

## 2020-10-09 NOTE — Progress Notes (Signed)
Progress Note  Patient Name: Brett Medina Date of Encounter: 10/09/2020  [facility] Cardiologist: Elouise Munroe, MD   Subjective   Feeling OK.  Breathing improving.  Denies chest pain.   Inpatient Medications    Scheduled Meds: . aspirin EC  81 mg Oral Daily  . atorvastatin  20 mg Oral Daily  . enoxaparin (LOVENOX) injection  40 mg Subcutaneous Q24H  . mouth rinse  15 mL Mouth Rinse BID  . metoprolol succinate  100 mg Oral Daily  . mupirocin ointment  1 application Nasal BID  . sodium chloride flush  3 mL Intravenous Q12H  . sodium chloride flush  3 mL Intravenous Q12H   Continuous Infusions: . sodium chloride     PRN Meds: sodium chloride, acetaminophen **OR** acetaminophen, guaiFENesin-codeine, labetalol, levalbuterol, lidocaine (PF), lidocaine, loperamide, ondansetron **OR** ondansetron (ZOFRAN) IV, polyethylene glycol, sodium chloride flush   Vital Signs    Vitals:   10/09/20 0009 10/09/20 0323 10/09/20 0357 10/09/20 0855  BP: (!) 128/98 (!) 143/92    Pulse: (!) 102  100 99  Resp: 16 18 18 16   Temp: 99 F (37.2 C) 98.6 F (37 C)  98.8 F (37.1 C)  TempSrc: Oral Oral  Oral  SpO2: 99% 98% 100%   Weight:   87.5 kg   Height:        Intake/Output Summary (Last 24 hours) at 10/09/2020 1032 Last data filed at 10/08/2020 1444 Gross per 24 hour  Intake 300 ml  Output 5 ml  Net 295 ml   Last 3 Weights 10/09/2020 10/08/2020 10/06/2020  Weight (lbs) 192 lb 14.4 oz 196 lb 14.4 oz 199 lb 9.7 oz  Weight (kg) 87.5 kg 89.313 kg 90.54 kg      Telemetry    Sinus tachycardia.  PVCs.  - Personally Reviewed  ECG    n/a - Personally Reviewed  Physical Exam   VS:  BP (!) 143/92 (BP Location: Right Arm)   Pulse 99   Temp 98.8 F (37.1 C) (Oral)   Resp 16   Ht 5\' 9"  (1.753 m)   Wt 87.5 kg   SpO2 100%   BMI 28.49 kg/m  , BMI Body mass index is 28.49 kg/m. GENERAL:  Well appearing HEENT: Pupils equal round and reactive, fundi not visualized, oral mucosa  unremarkable NECK:  No jugular venous distention, waveform within normal limits, carotid upstroke brisk and symmetric, no bruits LUNGS: Diminished on the L base. HEART:  RRR.  PMI not displaced or sustained,S1 and S2 within normal limits, no S3, no S4, no clicks, no rubs, no murmurs ABD:  Flat, positive bowel sounds normal in frequency in pitch, no bruits, no rebound, no guarding, no midline pulsatile mass, no hepatomegaly, no splenomegaly EXT:  2 plus pulses throughout, no edema, no cyanosis no clubbing SKIN:  No rashes no nodules NEURO:  Cranial nerves II through XII grossly intact, motor grossly intact throughout PSYCH:  Cognitively intact, oriented to person place and time   Labs    High Sensitivity Troponin:   Recent Labs  Lab 09/29/20 1635 09/29/20 2032 09/30/20 0213 10/01/20 0917  TROPONINIHS 1,087* 1,380* 1,446* 746*      Chemistry Recent Labs  Lab 10/06/20 0206 10/07/20 0234 10/09/20 0208  NA 134* 132* 136  K 4.1 4.2 4.4  CL 101 99 101  CO2 24 24 22   GLUCOSE 119* 94 103*  BUN 21* 18 21*  CREATININE 1.26* 1.19 1.33*  CALCIUM 8.7* 8.5* 8.5*  PROT  5.5* 5.1* 5.4*  ALBUMIN 2.1* 2.0* 2.0*  AST 53* 40 44*  ALT 55* 49* 41  ALKPHOS 175* 224* 177*  BILITOT 0.6 0.8 1.3*  GFRNONAA >60 >60 >60  ANIONGAP 9 9 13      Hematology Recent Labs  Lab 10/07/20 0234 10/08/20 0121 10/09/20 0208  WBC 10.9* 12.9* 12.9*  RBC 4.70 4.68 4.56  HGB 11.5* 11.5* 11.1*  HCT 37.2* 37.0* 36.2*  MCV 79.1* 79.1* 79.4*  MCH 24.5* 24.6* 24.3*  MCHC 30.9 31.1 30.7  RDW 16.8* 16.9* 17.0*  PLT 255 244 183    BNPNo results for input(s): BNP, PROBNP in the last 168 hours.   DDimer No results for input(s): DDIMER in the last 168 hours.   Radiology    DG Chest 2 View  Result Date: 10/08/2020 CLINICAL DATA:  Pleural effusion. EXAM: CHEST - 2 VIEW COMPARISON:  10/02/2020. FINDINGS: Worsening, now complete opacification of the left lung. Similar right perihilar and right basilar  opacities. Cardiomediastinal silhouette is largely obscured. No acute osseous abnormality. No visible pneumothorax. IMPRESSION: 1. Worsening, now complete opacification of the left lung that is concerning for a combination of pleural effusion and consolidation. 2. Similar right perihilar and right basilar opacities. Electronically Signed   By: Margaretha Sheffield MD   On: 10/08/2020 07:59    Cardiac Studies   LHC 10/04/20:   Dist LAD lesion is 50% stenosed.  1st RPL lesion is 20% stenosed.  Mid RCA lesion is 25% stenosed.  2nd Diag lesion is 40% stenosed.  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 No severe lesions or culprit for NSTEMI - suspect demand ischemia  Echo 09/30/20: 1. Left ventricular ejection fraction, by estimation, is 40 to 45%. The  left ventricle has mildly decreased function. The left ventricle  demonstrates global hypokinesis. Left ventricular diastolic parameters are  indeterminate.  2. Right ventricular systolic function is normal. The right ventricular  size is normal. Tricuspid regurgitation signal is inadequate for assessing  PA pressure.  3. The mitral valve is normal in structure. No evidence of mitral valve  regurgitation. No evidence of mitral stenosis.  4. The aortic valve was not well visualized. Aortic valve regurgitation  is trivial. No aortic stenosis is present.  5. Aortic dilatation noted. 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  6. The inferior vena cava is dilated in size with <50% respiratory  variability, suggesting right atrial pressure of 15 mmHg.   Patient Profile     59 y.o. male with hypertension, tobacco abuse, and limited medical care admitted with  exertional dyspnea and found to have hypoxic respiratory failure, acute systolic heart failure and likely metastatic lung cancer.   Assessment & Plan    # Acute systolic heart failure:  LVEF 40-45%.  Newly diagnosed this admission.  He is euvolemic on exam.  Nonobstructive disease on cath.  He is hyponatremic.  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  He was started on metoprolol and switched to succinate today.  Will add losartan 25mg  and follow BP and renal function closely.  # Non-obstructive CAD:  Cath revealed moderate diffuse LAD stenosis and 40% D2 lesion.  Manage medically.Continue atorvastatin.  He does have mild transaminitis.  This  will need to be monitored over time.  Continue aspirin.  #Anemia: Hemoglobin stable at 11.5.  #Sinus tachycardia: Wife notes that everyone in his family has a high resting heart rate. Improving with metoprolol.  Goal <100 bpm at rest.   # Hypoxia: # likely malignancy:  Per primary team.  Breathing improved with thoracentesis.  For questions or updates, please contact [place] Please consult [email or web address] for contact info under        Signed, Skeet Latch, MD  10/09/2020, 10:32 AM

## 2020-10-09 NOTE — Anesthesia Postprocedure Evaluation (Signed)
Anesthesia Post Note  Patient: Brett Medina  Procedure(s) Performed: VIDEO BRONCHOSCOPY WITH FLUORO (N/A ) BRONCHIAL BIOPSIES BRONCHIAL BRUSHINGS HEMOSTASIS CONTROL     Patient location during evaluation: PACU Anesthesia Type: General Level of consciousness: awake and alert Pain management: pain level controlled Vital Signs Assessment: post-procedure vital signs reviewed and stable Respiratory status: spontaneous breathing, nonlabored ventilation, respiratory function stable and patient connected to nasal cannula oxygen Cardiovascular status: blood pressure returned to baseline and stable Postop Assessment: no apparent nausea or vomiting Anesthetic complications: no   No complications documented.  Last Vitals:  Vitals:   10/09/20 1234 10/09/20 1638  BP: (!) 117/94 (!) 122/92  Pulse: 100 100  Resp: 20 16  Temp: 36.4 C 37.3 C  SpO2: 98% 98%    Last Pain:  Vitals:   10/09/20 1638  TempSrc: Oral  PainSc:    Pain Goal: Patients Stated Pain Goal: 0 (10/04/20 0713)                 Belenda Cruise P Lamisha Roussell

## 2020-10-09 NOTE — Evaluation (Signed)
Physical Therapy Evaluation Patient Details Name: Brett Medina MRN: [id number] DOB: 11-Feb-1961 Today's Date: 10/09/2020   History of Present Illness  59 y/o M presenting to ED on 11/28 for SOB that has gotten worse over past months. Found to have L chronic pleural effusions that continues to wrosen despite 11/29 and 12/1 L thorancentesis. CT on 11/28 also found R metastatic findings in R lung. PMHx: tobacco use and LVEF 40-45%.  Clinical Impression  Pt admitted with above diagnosis. Pt with noted confusion during session.  Pt was able to come to EOB and PT noted that right UE appeared to be weak. Checked symmetry and LE strength which was normal.  Pt was on 2LO2 at rest and removed to perform walking saturation per note, however pt desat to 82% on RA so replaced O2 at 2L.  During ambulation, needed 4L to keep sats >90%.  Noted pt could not grip the RW with right UE during walk.  Pt with poor attention to task and poor motor planning as well with incr DOE fatiguing and having to have pt come back to bed quickly.  Assisted pt to bed and wife came in. Asked wife regarding the right UE weakness and confusion and she states he has been more confused the last few days and that his arm is weaker than usual. THis PT notified the nurse and MD regarding findings during the evaluation today and MD ordered MRI.  Will follow and progress as able.   Pt currently with functional limitations due to the deficits listed below (see PT Problem List). Pt will benefit from skilled PT to increase their independence and safety with mobility to allow discharge to the venue listed below.      Follow Up Recommendations Supervision/Assistance - 24 hour;CIR    Equipment Recommendations  None recommended by PT    Recommendations for Other Services       Precautions / Restrictions Precautions Precautions: Fall Restrictions Weight Bearing Restrictions: No      Mobility  Bed Mobility Overal bed mobility: Needs  Assistance Bed Mobility: Supine to Sit     Supine to sit: Min assist     General bed mobility comments: Pt needed cues and assist to come to EOB as his right UE lagging behind with pt with poor awareness of right UE unless cued.  Did check to see if pt had any facial droop, any speech issues or numbness and pt did not.  Removed O2 on arrival and pt desaturated to 82% on RA therefore replaced O2 at 4L.      Transfers Overall transfer level: Needs assistance Equipment used: Rolling walker (2 wheeled) Transfers: Sit to/from Stand Sit to Stand: Min assist         General transfer comment: Needed steadying asssit to stand to RW.  Noted that pt had difficulty gripping RW.   Ambulation/Gait Ambulation/Gait assistance: Min guard;Min assist Gait Distance (Feet): 10 Feet Assistive device: Rolling walker (2 wheeled) Gait Pattern/deviations: Decreased stride length;Step-through pattern   Gait velocity interpretation: <1.31 ft/sec, indicative of household ambulator General Gait Details: Pt ambulated to the sink with DOE 3/4 as well as difficulty keeping right Ue on RW with notable fatigue and desaturation below 90% on 4L therefore had pt walk back to bed and assist to lying down.   Stairs            Wheelchair Mobility    Modified Rankin (Stroke Patients Only) Modified Rankin (Stroke Patients Only) Pre-Morbid Rankin Score: No symptoms  Modified Rankin: Moderately severe disability     Balance Overall balance assessment: Needs assistance Sitting-balance support: No upper extremity supported;Feet supported Sitting balance-Leahy Scale: Fair     Standing balance support: Bilateral upper extremity supported;During functional activity Standing balance-Leahy Scale: Poor Standing balance comment: relies on UE support and external support                             Pertinent Vitals/Pain Pain Assessment: No/denies pain    Home Living Family/patient expects to be  discharged to:: Private residence Living Arrangements: Spouse/significant other Available Help at Discharge: Family;Available 24 hours/day (wife, daughter) Type of Home: Mobile home Home Access: Stairs to enter Entrance Stairs-Rails: Right Entrance Stairs-Number of Steps: 10 Home Layout: One level Home Equipment: None      Prior Function Level of Independence: Independent         Comments: DRives, works for [employer] from home     [place]: Left    Extremity/Trunk Assessment   Upper Extremity Assessment Upper Extremity Assessment: Defer to OT evaluation    Lower Extremity Assessment Lower Extremity Assessment: Generalized weakness    Cervical / Trunk Assessment Cervical / Trunk Assessment: Normal  Communication   Communication: No difficulties  Cognition Arousal/Alertness: Awake/alert Behavior During Therapy: Flat affect Overall Cognitive Status: Impaired/Different from baseline Area of Impairment: Orientation;Memory;Following commands;Safety/judgement;Problem solving;Attention                 Orientation Level: Disoriented to;Situation;Time Current Attention Level: Focused Memory: Decreased short-term memory Following Commands: Follows one step commands with increased time Safety/Judgement: Decreased awareness of safety;Decreased awareness of deficits   Problem Solving: Slow processing;Decreased initiation;Difficulty sequencing;Requires verbal cues General Comments: Pt confused intermittently per wife over last few days. Pt slow to respond to commands, delayed processing of information and difficulty attending to tasks.  Poor safety awareness as well.        General Comments General comments (skin integrity, edema, etc.): right UE edematous and bruised.     Exercises     Assessment/Plan    PT Assessment Patient needs continued PT services  PT Problem List Decreased mobility;Decreased balance;Decreased activity  tolerance;Decreased coordination;Decreased strength;Decreased knowledge of use of DME;Decreased safety awareness;Decreased knowledge of precautions;Cardiopulmonary status limiting activity       PT Treatment Interventions DME instruction;Gait training;Functional mobility training;Therapeutic activities;Therapeutic exercise;Balance training;Patient/family education;Stair training    PT Goals (Current goals can be found in the Care Plan section)  Acute Rehab PT Goals Patient Stated Goal: to go home PT Goal Formulation: With patient Time For Goal Achievement: 10/23/20 Potential to Achieve Goals: Good    Frequency Min 3X/week   Barriers to discharge        Co-evaluation               AM-PAC PT "6 Clicks" Mobility  Outcome Measure Help needed turning from your back to your side while in a flat bed without using bedrails?: A Little Help needed moving from lying on your back to sitting on the side of a flat bed without using bedrails?: A Little Help needed moving to and from a bed to a chair (including a wheelchair)?: A Little Help needed standing up from a chair using your arms (e.g., wheelchair or bedside chair)?: A Little Help needed to walk in hospital room?: A Little Help needed climbing 3-5 steps with a railing? : A Little 6 Click Score: 18  End of Session Equipment Utilized During Treatment: Gait belt;Oxygen Activity Tolerance: Patient limited by fatigue Patient left: in bed;with call bell/phone within reach;with family/visitor present Nurse Communication: Mobility status (chat nurse regarding right UE weakness/pt confusion) PT Visit Diagnosis: Unsteadiness on feet (R26.81);Muscle weakness (generalized) (M62.81)    Time: 3582-5189 PT Time Calculation (min) (ACUTE ONLY): 25 min   Charges:   PT Evaluation $PT Eval Moderate Complexity: 1 Mod PT Treatments $Gait Training: 8-22 mins        Safire Gordin W,PT Acute Rehabilitation Services Pager:  [phone]  Office:   [place] 10/09/2020, 2:10 PM

## 2020-10-10 ENCOUNTER — Encounter (HOSPITAL_COMMUNITY): Payer: Self-pay | Admitting: Internal Medicine

## 2020-10-10 ENCOUNTER — Inpatient Hospital Stay (HOSPITAL_COMMUNITY): Payer: Self-pay

## 2020-10-10 ENCOUNTER — Other Ambulatory Visit: Payer: Self-pay

## 2020-10-10 DIAGNOSIS — I63429 Cerebral infarction due to embolism of unspecified anterior cerebral artery: Secondary | ICD-10-CM

## 2020-10-10 DIAGNOSIS — I639 Cerebral infarction, unspecified: Secondary | ICD-10-CM

## 2020-10-10 DIAGNOSIS — R0602 Shortness of breath: Secondary | ICD-10-CM

## 2020-10-10 DIAGNOSIS — I6340 Cerebral infarction due to embolism of unspecified cerebral artery: Secondary | ICD-10-CM | POA: Insufficient documentation

## 2020-10-10 DIAGNOSIS — R233 Spontaneous ecchymoses: Secondary | ICD-10-CM

## 2020-10-10 DIAGNOSIS — R059 Cough, unspecified: Secondary | ICD-10-CM

## 2020-10-10 DIAGNOSIS — Z66 Do not resuscitate: Secondary | ICD-10-CM

## 2020-10-10 DIAGNOSIS — Z789 Other specified health status: Secondary | ICD-10-CM

## 2020-10-10 DIAGNOSIS — I69398 Other sequelae of cerebral infarction: Secondary | ICD-10-CM

## 2020-10-10 DIAGNOSIS — I483 Typical atrial flutter: Secondary | ICD-10-CM

## 2020-10-10 DIAGNOSIS — Z515 Encounter for palliative care: Secondary | ICD-10-CM

## 2020-10-10 DIAGNOSIS — Z7189 Other specified counseling: Secondary | ICD-10-CM

## 2020-10-10 LAB — COMPREHENSIVE METABOLIC PANEL
ALT: 44 U/L (ref 0–44)
AST: 57 U/L — ABNORMAL HIGH (ref 15–41)
Albumin: 2 g/dL — ABNORMAL LOW (ref 3.5–5.0)
Alkaline Phosphatase: 175 U/L — ABNORMAL HIGH (ref 38–126)
Anion gap: 8 (ref 5–15)
BUN: 24 mg/dL — ABNORMAL HIGH (ref 6–20)
CO2: 27 mmol/L (ref 22–32)
Calcium: 8.6 mg/dL — ABNORMAL LOW (ref 8.9–10.3)
Chloride: 102 mmol/L (ref 98–111)
Creatinine, Ser: 1.52 mg/dL — ABNORMAL HIGH (ref 0.61–1.24)
GFR, Estimated: 53 mL/min — ABNORMAL LOW (ref >60)
Glucose, Bld: 110 mg/dL — ABNORMAL HIGH (ref 70–99)
Potassium: 4.5 mmol/L (ref 3.5–5.1)
Sodium: 137 mmol/L (ref 135–145)
Total Bilirubin: 0.7 mg/dL (ref 0.3–1.2)
Total Protein: 5.1 g/dL — ABNORMAL LOW (ref 6.5–8.1)

## 2020-10-10 LAB — CBC WITH DIFFERENTIAL/PLATELET
Abs Immature Granulocytes: 0.2 10*3/uL — ABNORMAL HIGH (ref 0.00–0.07)
Basophils Absolute: 0 10*3/uL (ref 0.0–0.1)
Basophils Relative: 0 %
Eosinophils Absolute: 0 10*3/uL (ref 0.0–0.5)
Eosinophils Relative: 0 %
HCT: 34 % — ABNORMAL LOW (ref 39.0–52.0)
Hemoglobin: 11 g/dL — ABNORMAL LOW (ref 13.0–17.0)
Immature Granulocytes: 2 %
Lymphocytes Relative: 5 %
Lymphs Abs: 0.6 10*3/uL — ABNORMAL LOW (ref 0.7–4.0)
MCH: 25.5 pg — ABNORMAL LOW (ref 26.0–34.0)
MCHC: 32.4 g/dL (ref 30.0–36.0)
MCV: 78.7 fL — ABNORMAL LOW (ref 80.0–100.0)
Monocytes Absolute: 1.7 10*3/uL — ABNORMAL HIGH (ref 0.1–1.0)
Monocytes Relative: 13 %
Neutro Abs: 10.2 10*3/uL — ABNORMAL HIGH (ref 1.7–7.7)
Neutrophils Relative %: 80 %
Platelets: 168 10*3/uL (ref 150–400)
RBC: 4.32 MIL/uL (ref 4.22–5.81)
RDW: 17.1 % — ABNORMAL HIGH (ref 11.5–15.5)
WBC: 12.7 10*3/uL — ABNORMAL HIGH (ref 4.0–10.5)
nRBC: 0 % (ref 0.0–0.2)

## 2020-10-10 LAB — PHOSPHORUS: Phosphorus: 3.3 mg/dL (ref 2.5–4.6)

## 2020-10-10 LAB — LIPID PANEL
Cholesterol: 130 mg/dL (ref 0–200)
HDL: 35 mg/dL — ABNORMAL LOW (ref >40)
LDL Cholesterol: 73 mg/dL (ref 0–99)
Total CHOL/HDL Ratio: 3.7 RATIO
Triglycerides: 109 mg/dL (ref <150)
VLDL: 22 mg/dL (ref 0–40)

## 2020-10-10 LAB — SURGICAL PATHOLOGY

## 2020-10-10 LAB — MAGNESIUM: Magnesium: 1.9 mg/dL (ref 1.7–2.4)

## 2020-10-10 LAB — CYTOLOGY - NON PAP

## 2020-10-10 MED ORDER — SODIUM CHLORIDE 0.9 % IV BOLUS
250.0000 mL | Freq: Once | INTRAVENOUS | Status: AC
  Administered 2020-10-10: 250 mL via INTRAVENOUS

## 2020-10-10 MED ORDER — DIGOXIN 0.25 MG/ML IJ SOLN
0.2500 mg | Freq: Once | INTRAMUSCULAR | Status: AC
  Administered 2020-10-10: 0.25 mg via INTRAVENOUS
  Filled 2020-10-10: qty 1

## 2020-10-10 MED ORDER — METOPROLOL TARTRATE 5 MG/5ML IV SOLN
5.0000 mg | Freq: Once | INTRAVENOUS | Status: AC
  Administered 2020-10-10: 5 mg via INTRAVENOUS
  Filled 2020-10-10: qty 5

## 2020-10-10 MED ORDER — ATORVASTATIN CALCIUM 40 MG PO TABS
40.0000 mg | ORAL_TABLET | Freq: Every day | ORAL | Status: DC
  Administered 2020-10-10: 40 mg via ORAL
  Filled 2020-10-10: qty 1

## 2020-10-10 MED ORDER — ATORVASTATIN CALCIUM 40 MG PO TABS
40.0000 mg | ORAL_TABLET | Freq: Every day | ORAL | Status: DC
  Administered 2020-10-11 – 2020-10-13 (×3): 40 mg via ORAL
  Filled 2020-10-10 (×3): qty 1

## 2020-10-10 MED ORDER — METOPROLOL TARTRATE 25 MG PO TABS
25.0000 mg | ORAL_TABLET | Freq: Two times a day (BID) | ORAL | Status: DC
  Administered 2020-10-10 – 2020-10-11 (×3): 25 mg via ORAL
  Filled 2020-10-10 (×3): qty 1

## 2020-10-10 MED ORDER — METOPROLOL TARTRATE 5 MG/5ML IV SOLN
2.5000 mg | INTRAVENOUS | Status: DC | PRN
  Administered 2020-10-10 – 2020-10-16 (×16): 2.5 mg via INTRAVENOUS
  Filled 2020-10-10 (×19): qty 5

## 2020-10-10 MED ORDER — DIGOXIN 0.25 MG/ML IJ SOLN
0.1250 mg | Freq: Four times a day (QID) | INTRAMUSCULAR | Status: AC
  Administered 2020-10-10 (×2): 0.125 mg via INTRAVENOUS
  Filled 2020-10-10 (×2): qty 0.5

## 2020-10-10 MED ORDER — DIGOXIN 0.25 MG/ML IJ SOLN
0.2500 mg | Freq: Once | INTRAMUSCULAR | Status: DC
Start: 2020-10-10 — End: 2020-10-10

## 2020-10-10 MED ORDER — GADOBUTROL 1 MMOL/ML IV SOLN
10.0000 mL | Freq: Once | INTRAVENOUS | Status: AC | PRN
  Administered 2020-10-10: 10 mL via INTRAVENOUS

## 2020-10-10 MED ORDER — LORAZEPAM 2 MG/ML IJ SOLN
0.5000 mg | Freq: Once | INTRAMUSCULAR | Status: AC
  Administered 2020-10-10: 0.5 mg via INTRAVENOUS
  Filled 2020-10-10: qty 1

## 2020-10-10 NOTE — Progress Notes (Addendum)
Pt HR continues to be elevated 140-190s. All scheduled and PRN medications given. Rosaria Ferries PA paged, waiting on new orders.  1136: Orders received for 250ML bolus. Medication administered as ordered.   Will continue to monitor.  Clyde Canterbury, RN

## 2020-10-10 NOTE — Evaluation (Signed)
Speech Language Pathology Evaluation Patient Details Name: Brett Medina MRN: [id number] DOB: Jul 03, 1961 Today's Date: 10/10/2020 Time: 1000-1011 SLP Time Calculation (min) (ACUTE ONLY): 11 min  Problem List:  Patient Active Problem List   Diagnosis Date Noted  . Cerebral embolism with cerebral infarction 10/10/2020  . Mass of left lung 10/07/2020  . Acute systolic heart failure ([place])   . Demand ischemia ([place])   . Large pleural effusion 09/29/2020  . Elevated troponin 09/29/2020   Past Medical History: History reviewed. No pertinent past medical history. Past Surgical History:  Past Surgical History:  Procedure Laterality Date  . BRONCHIAL BIOPSY  10/08/2020   Procedure: BRONCHIAL BIOPSIES;  Surgeon: Collene Gobble, MD;  Location: [facility] ENDOSCOPY;  Service: Cardiopulmonary;;  . BRONCHIAL BRUSHINGS  10/08/2020   Procedure: BRONCHIAL BRUSHINGS;  Surgeon: Collene Gobble, MD;  Location: [facility] ENDOSCOPY;  Service: Cardiopulmonary;;  . HEMOSTASIS CONTROL  10/08/2020   Procedure: HEMOSTASIS CONTROL;  Surgeon: Collene Gobble, MD;  Location: [facility] ENDOSCOPY;  Service: Cardiopulmonary;;  . IR THORACENTESIS ASP PLEURAL SPACE W/IMG GUIDE  09/30/2020  . IR THORACENTESIS ASP PLEURAL SPACE W/IMG GUIDE  10/02/2020  . LEFT HEART CATH AND CORONARY ANGIOGRAPHY N/A 10/04/2020   Procedure: LEFT HEART CATH AND CORONARY ANGIOGRAPHY;  Surgeon: Sherren Mocha, MD;  Location: [place] CV LAB;  Service: Cardiovascular;  Laterality: N/A;  . VIDEO BRONCHOSCOPY N/A 10/08/2020   Procedure: VIDEO BRONCHOSCOPY WITH FLUORO;  Surgeon: Collene Gobble, MD;  Location: [place] ENDOSCOPY;  Service: Cardiopulmonary;  Laterality: N/A;   HPI:  59 y/o M presenting to ED on 11/28 for SOB that has gotten worse over past months. Found to have L chronic pleural effusions that continues to wrosen despite 11/29 and 12/1 L thorancentesis. CT on 11/28 also found R metastatic findings in R lung. Intermittent confusion also prompted MRI 12/8 that  revealed numerous acute and subacute embolic infarcts. PMHx: tobacco use and LVEF 40-45%.   Assessment / Plan / Recommendation Clinical Impression  Pt presents with an acute change in cognition that includes reduced recall of short-term and longer-term information, including details like his cat's name, where he lives, and with whom he lives. He has trouble consistently following commands and is disoriented to situation and time. His daughter describes him as being very "sharp" PTA but today his reduced thought organization appears to impact how well he can organize his verbal output. He will benefit from ongoing SLP follow up.    SLP Assessment  SLP Recommendation/Assessment: Patient needs continued Speech Lanaguage Pathology Services SLP Visit Diagnosis: Cognitive communication deficit (R41.841)    Follow Up Recommendations  Inpatient Rehab    Frequency and Duration min 2x/week  2 weeks      SLP Evaluation Cognition  Overall Cognitive Status: Impaired/Different from baseline Arousal/Alertness: Awake/alert Orientation Level: Oriented to person;Oriented to place;Disoriented to time;Disoriented to situation Attention: Sustained Sustained Attention: Impaired Sustained Attention Impairment: Verbal complex Memory: Impaired Memory Impairment: Decreased short term memory;Decreased long term memory Decreased Long Term Memory: Verbal basic Decreased Short Term Memory: Verbal basic Awareness: Impaired Awareness Impairment: Intellectual impairment       Comprehension  Auditory Comprehension Overall Auditory Comprehension: Impaired Commands: Impaired One Step Basic Commands: 50-74% accurate Conversation: Simple    Expression Expression Primary Mode of Expression: Verbal Verbal Expression Overall Verbal Expression: Impaired (decreased thought organization impacting verbal output)   Oral / Motor  Oral Motor/Sensory Function Overall Oral Motor/Sensory Function: Within functional  limits Motor Speech Overall Motor Speech: Appears within functional  limits for tasks assessed   GO                    Osie Bond., M.A. [place] Acute Rehabilitation Services Pager [phone] Office [phone]  10/10/2020, 10:47 AM

## 2020-10-10 NOTE — Consult Note (Addendum)
[facility]  [phone] Fax:(336) [phone]   MEDICAL ONCOLOGY - INITIAL CONSULTATION  Referral MD: Dr. Lesia Sago  Reason for Referral: Newly diagnosed non-small cell lung cancer, adenocarcinoma  HPI: Brett Medina is a 59 year old male with a past medical history significant for tobacco dependence, acute systolic CHF, atrial fibrillation, and nonobstructive CAD diagnosed this admission.  Reports that he has not had any routine primary care in approximately 15 years.  He presented to the hospital with difficulty breathing.  His difficulty breathing has been ongoing for the past 2 months.  He was seen in urgent care and was given prednisone.  He also quit smoking cigarettes about 2 months ago.  He also reported a productive cough.  On admission, he was tachycardic and tachypneic.  WBC was 13.2 and troponin was 1087.  CT chest on admission showed constellation of findings consistent with metastatic malignancy and a moderate to large volume left pleural effusion with almost complete collapse of the left upper and lower lobes with associated mediastinal lymphadenopathy, indeterminate multifocal right lung pulmonary nodules measuring up to 1 cm in the right middle lobe, indeterminate 3.2 cm peribronchovascular groundglass airspace in the right lower lobe, scattered suspicious sclerotic lesions concerning for osseous metastases.  He had an ultrasound-guided thoracentesis on 09/30/2020 with 950 cc of fluid removed.  This was sent for cytology and was suspicious for malignancy.  He had a repeat thoracentesis performed on 10/02/2020 but did not see that the fluid was sent for cytology.  CT chest/abdomen/pelvis without contrast was performed on 10/02/2020 and showed a large left upper lobe mass with findings of metastatic disease in the lungs, liver, bones.  There is also circumferential thickening of the rectosigmoid colon concerning for underlying malignancy.  He underwent  bronchoscopy on 10/08/2020.  Biopsy consistent with non-small cell lung cancer, adenocarcinoma.  MRI of the brain was performed without contrast on 10/09/2020 which showed widely scattered abnormal signal throughout the brain most compatible with numerous acute and subacute embolic infarcts-many of the areas demonstrate associated petechial hemorrhage raising the possibility of septic emboli, no strong evidence of metastatic disease to the brain on the noncontrasted MRI.  He has been seen by neurology and they recommend MRI of the brain with contrast for further evaluation. Palliative care consult has also been ordered.  The patient was seen in his hospital room.  His wife, 2 daughters, and son-in-law were at the bedside.  The patient reports that he has a 27-monthhistory of shortness of breath.  Did not respond to prednisone.  He had a thoracentesis x2 this admission.  He had a lot of coughing immediately after the procedure but then his breathing improved.  Cough is starting to worsen again.  Does not have any hemoptysis.  He reports that at home he had a poor appetite.  He estimates that he has lost about 30 pounds.  Family reports that he lost vision in his right eye about 1 month ago.  They also note that he has intermittent confusion.  In particular, he seems to have difficulty with word finding.  He currently denies headaches and dizziness.  Denies chest pain, abdominal pain, nausea, vomiting, constipation, diarrhea.  Denies bone pain.  No bleeding reported.  The patient is married and has 2 adult daughters.  [place]  Denies current alcohol use.  He smoked 2-1/2 to 3 packs of cigarettes per day from his teens up until about 2 months ago.  No family history of  cancer reported.  Medical oncology was asked see the patient to make recommendations regarding his newly diagnosed metastatic non-small cell lung cancer.    History reviewed. No pertinent past medical history.:  Past  Surgical History:  Procedure Laterality Date  . BRONCHIAL BIOPSY  10/08/2020   Procedure: BRONCHIAL BIOPSIES;  Surgeon: Collene Gobble, MD;  Location: [facility] ENDOSCOPY;  Service: Cardiopulmonary;;  . BRONCHIAL BRUSHINGS  10/08/2020   Procedure: BRONCHIAL BRUSHINGS;  Surgeon: Collene Gobble, MD;  Location: [facility] ENDOSCOPY;  Service: Cardiopulmonary;;  . HEMOSTASIS CONTROL  10/08/2020   Procedure: HEMOSTASIS CONTROL;  Surgeon: Collene Gobble, MD;  Location[facility] ENDOSCOPY;  Service: Cardiopulmonary;;  . IR THORACENTESIS ASP PLEURAL SPACE W/IMG GUIDE  09/30/2020  . IR THORACENTESIS ASP PLEURAL SPACE W/IMG GUIDE  10/02/2020  . LEFT HEART CATH AND CORONARY ANGIOGRAPHY N/A 10/04/2020   Procedure: LEFT HEART CATH AND CORONARY ANGIOGRAPHY;  Surgeon: Sherren Mocha, MD;  Location: [facility] LAB;  Service: Cardiovascular;  Laterality: N/A;  . VIDEO BRONCHOSCOPY N/A 10/08/2020   Procedure: VIDEO BRONCHOSCOPY WITH FLUORO;  Surgeon: Collene Gobble, MD;  Location: [facility] ENDOSCOPY;  Service: Cardiopulmonary;  Laterality: N/A;  :  Current Facility-Administered Medications  Medication Dose Route Frequency Provider Last Rate Last Admin  . 0.9 %  sodium chloride infusion  250 mL Intravenous PRN Collene Gobble, MD      . acetaminophen (TYLENOL) tablet 650 mg  650 mg Oral Q6H PRN Collene Gobble, MD   650 mg at 10/04/20 0630   Or  . acetaminophen (TYLENOL) suppository 650 mg  650 mg Rectal Q6H PRN Collene Gobble, MD      . aspirin EC tablet 81 mg  81 mg Oral Daily Collene Gobble, MD   81 mg at 10/10/20 0817  . atorvastatin (LIPITOR) tablet 40 mg  40 mg Oral Daily Alma Friendly, MD   40 mg at 10/10/20 0816  . digoxin (LANOXIN) 0.25 MG/ML injection 0.125 mg  0.125 mg Intravenous Q6H Skeet Latch, MD      . enoxaparin (LOVENOX) injection 40 mg  40 mg Subcutaneous Q24H Alma Friendly, MD   40 mg at 10/10/20 0816  . guaiFENesin-codeine 100-10 MG/5ML solution 10 mL  10 mL Oral Q6H PRN Collene Gobble, MD    10 mL at 10/10/20 0817  . levalbuterol (XOPENEX) nebulizer solution 0.63 mg  0.63 mg Nebulization Q6H PRN Collene Gobble, MD      . loperamide (IMODIUM) capsule 2 mg  2 mg Oral PRN Collene Gobble, MD      . MEDLINE mouth rinse  15 mL Mouth Rinse BID Collene Gobble, MD   15 mL at 10/10/20 0818  . metoprolol tartrate (LOPRESSOR) injection 2.5 mg  2.5 mg Intravenous Q2H PRN Barrett, Rhonda G, PA-C   2.5 mg at 10/10/20 1117  . metoprolol tartrate (LOPRESSOR) tablet 25 mg  25 mg Oral BID Skeet Latch, MD   25 mg at 10/10/20 1045  . mupirocin ointment (BACTROBAN) 2 % 1 application  1 application Nasal BID Collene Gobble, MD   1 application at 94/07/68 0818  . ondansetron (ZOFRAN) tablet 4 mg  4 mg Oral Q6H PRN Collene Gobble, MD       Or  . ondansetron [facility]) injection 4 mg  4 mg Intravenous Q6H PRN Collene Gobble, MD   4 mg at 10/06/20 1605  . polyethylene glycol (MIRALAX / GLYCOLAX) packet 17 g  17 g Oral  Daily PRN Collene Gobble, MD      . sodium chloride flush (NS) 0.9 % injection 3 mL  3 mL Intravenous Q12H Collene Gobble, MD   3 mL at 10/10/20 0818  . sodium chloride flush (NS) 0.9 % injection 3 mL  3 mL Intravenous Q12H Collene Gobble, MD   3 mL at 10/10/20 0818  . sodium chloride flush (NS) 0.9 % injection 3 mL  3 mL Intravenous PRN Collene Gobble, MD         Allergies  Allergen Reactions  . Contrast Media [Iodinated Diagnostic Agents]     Flushing, warmth, even with pre-medication  :  History reviewed. No pertinent family history.:  Social History   Socioeconomic History  . Marital status: Married    Spouse name: Not on file  . Number of children: Not on file  . Years of education: Not on file  . Highest education level: Not on file  Occupational History  . Not on file  Tobacco Use  . Smoking status: Former Smoker    Types: Cigarettes    Quit date: 07/03/2020    Years since quitting: 0.2  . Smokeless tobacco: Never Used  Vaping Use  . Vaping Use: Never  used  Substance and Sexual Activity  . Alcohol use: Yes  . Drug use: Not Currently    Types: Marijuana  . Sexual activity: Not on file  Other Topics Concern  . Not on file  Social History Narrative  . Not on file   Social Determinants of Health   Financial Resource Strain: Not on file  Food Insecurity: Not on file  Transportation Needs: Not on file  Physical Activity: Not on file  Stress: Not on file  Social Connections: Not on file  Intimate Partner Violence: Not on file  :  Review of Systems: A comprehensive 14 point review of systems was negative except as noted in the HPI.  Exam: Patient Vitals for the past 24 hrs:  BP Temp Temp src Pulse Resp SpO2 Weight  10/10/20 1135 (!) 122/92 99.3 F (37.4 C) Oral (!) 35 (!) 21 96 % --  10/10/20 1121 (!) 86/68 -- -- 79 14 95 % --  10/10/20 1111 112/80 -- -- 86 18 95 % --  10/10/20 1000 (!) 122/106 98.5 F (36.9 C) Oral 97 (!) 21 92 % --  10/10/20 0911 102/84 98.4 F (36.9 C) Oral (!) 29 20 96 % --  10/10/20 0815 (!) 122/94 -- -- (!) 41 (!) 22 99 % --  10/10/20 0755 115/89 -- -- (!) 111 (!) 24 100 % --  10/10/20 0751 131/74 98.5 F (36.9 C) Oral 62 (!) 24 99 % --  10/10/20 0324 127/81 98 F (36.7 C) Oral (!) 108 20 98 % 86.7 kg  10/09/20 2355 (!) 143/99 98.3 F (36.8 C) Oral -- 20 94 % --  10/09/20 2050 127/88 98.8 F (37.1 C) Oral 100 18 98 % --  10/09/20 1638 (!) 122/92 99.2 F (37.3 C) Oral 100 16 98 % --    General: Chronically ill-appearing male, no distress. Eyes:  no scleral icterus.   ENT:  There were no oropharyngeal lesions.    Lymphatics:  Negative cervical, supraclavicular or axillary adenopathy.   Respiratory: Diminished breath sounds on the left, scattered rhonchi Cardiovascular: Tachycardic, no murmurs, gallops, rubs.  Bilateral lower extremities with 1+ edema. GI:  abdomen was soft, flat, nontender, nondistended, without organomegaly.   Musculoskeletal:  no spinal tenderness of  palpation of vertebral  spine.   Skin exam was without echymosis, petichae.   Neuro exam was nonfocal.  The patient is alert and oriented to person and place.  Thinks it is 2011.  However, he knows who the president is.  Lab Results  Component Value Date   WBC 12.7 (H) 10/10/2020   HGB 11.0 (L) 10/10/2020   HCT 34.0 (L) 10/10/2020   PLT 168 10/10/2020   GLUCOSE 110 (H) 10/10/2020   CHOL 130 10/10/2020   TRIG 109 10/10/2020   HDL 35 (L) 10/10/2020   LDLCALC 73 10/10/2020   ALT 44 10/10/2020   AST 57 (H) 10/10/2020   NA 137 10/10/2020   K 4.5 10/10/2020   CL 102 10/10/2020   CREATININE 1.52 (H) 10/10/2020   BUN 24 (H) 10/10/2020   CO2 27 10/10/2020    CT ABDOMEN PELVIS WO CONTRAST  Result Date: 10/02/2020 CLINICAL DATA:  59 year old male with cancer of unknown primary. EXAM: CT CHEST, ABDOMEN AND PELVIS WITHOUT CONTRAST TECHNIQUE: Multidetector CT imaging of the chest, abdomen and pelvis was performed following the standard protocol without IV contrast. COMPARISON:  Chest radiograph dated 10/02/2020. chest CT dated 09/29/2020. FINDINGS: Evaluation of this exam is limited in the absence of intravenous contrast. CT CHEST FINDINGS Cardiovascular: There is no cardiomegaly or pericardial effusion. The thoracic aorta and central pulmonary arteries are grossly unremarkable. Mediastinum/Nodes: Mildly enlarged right paratracheal lymph node measures 12 mm. The esophagus and the thyroid gland are grossly unremarkable. No mediastinal fluid collection. Lungs/Pleura: There is a 10.0 x 7.5 x 12.0 cm mass in the left upper lobe. There is diffuse interstitial coarsening and nodularity of the left lung with smaller scattered nodules concerning for metastatic spread. There is a small left pleural effusion, likely malignant effusion. There is consolidative changes of the majority of the left lung which may be combination of atelectasis, infiltrate, or metastasis. Several scattered nodules in the right lung measure up to 1 cm  consistent with metastatic disease. There is a patchy area of nodular and ground-glass density in the right infrahilar region which may represent pneumonia, or aspiration. Metastasis is not excluded. There is background of moderate to severe centrilobular emphysema. No pneumothorax. There is compression and high-grade narrowing of the left lower lobe bronchus and occlusion of the left upper lobe bronchus by the left lung mass. There is apparent nodular thickening of the left pleural consistent with metastatic implant. Musculoskeletal: There is sclerotic lesion in the T6 as well as sclerotic changes of C7. Small sclerotic focus in T9. CT ABDOMEN PELVIS FINDINGS No intra-abdominal free air or free fluid. Hepatobiliary: Multiple hepatic hypodense lesions consistent with metastatic disease. These measure up to 2.5 cm in the left lobe of the liver. 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 No calcified gallstone or pericholecystic fluid. Pancreas: The pancreas is grossly unremarkable. Spleen: Heterogeneous appearance of the spleen. Adrenals/Urinary Tract: The adrenal glands are unremarkable. Multiple nonobstructing bilateral renal calculi measure up to 8 mm in the inferior pole of the right kidney. There is no hydronephrosis on either side. There is a 6 cm right renal cyst. The visualized ureters and urinary bladder appear unremarkable. Stomach/Bowel: There is circumferential thickening of the rectosigmoid involving approximately 9 cm segment (95/8) which may be inflammatory in etiology. There is however apparent shouldering of the soft tissue (sagittal 93/8) concerning for underlying malignancy. Clinical correlation and further evaluation with sigmoidoscopy or colonoscopy is recommended. There is moderate stool throughout the colon. There is colonic diverticulosis  without active inflammatory changes. There is no bowel obstruction. The appendix is normal. Vascular/Lymphatic: The abdominal aorta and  IVC unremarkable. No portal venous gas. No adenopathy. Reproductive: The prostate and seminal vesicles are grossly unremarkable. Other: Subcutaneous edema of the lateral pelvic wall. No fluid collection. Musculoskeletal: Faint irregular lucency involving the left superior pubic ramus concerning for metastatic disease. Several scattered small sclerotic lesions also noted involving the right ischium, sacral bone also concerning for metastatic disease. No acute fracture. IMPRESSION: 1. Large left upper lobe mass with findings of metastatic disease in the lungs, liver, and bones. 2. Circumferential thickening of the rectosigmoid with apparent shouldering concerning for underlying malignancy. Clinical correlation and further evaluation with sigmoidoscopy or colonoscopy is recommended. 3. Nonobstructing bilateral renal calculi. No hydronephrosis. 4. Colonic diverticulosis. No bowel obstruction. Normal appendix. 5. Aortic Atherosclerosis (ICD10-I70.0) and Emphysema (ICD10-J43.9). Electronically Signed   By: Anner Crete M.D.   On: 10/02/2020 19:46   DG Chest 1 View  Result Date: 10/02/2020 CLINICAL DATA:  Status post thoracentesis EXAM: CHEST  1 VIEW COMPARISON:  October 01, 2020 FINDINGS: No pneumothorax. There remains extensive opacity throughout the left lung, largely due to airspace consolidation but with questionable residual pleural effusion. Ill-defined opacity right base is stable. No new opacity evident. Stable cardiac prominence. Pulmonary vascular on the right appears unremarkable. Pulmonary vascularity on the left is obscured. No bone lesions. IMPRESSION: No pneumothorax. Opacification of most of the left hemithorax remains, likely due to consolidation with questionable residual pleural effusion. Suspect patchy pneumonia right base, stable. No new opacity evident. Cardiac silhouette appears stable. Electronically Signed   By: Lowella Grip III M.D.   On: 10/02/2020 08:50   DG Chest 1  View  Result Date: 09/30/2020 CLINICAL DATA:  Post thoracentesis EXAM: CHEST  1 VIEW COMPARISON:  09/29/2020 FINDINGS: Slightly decreased, still large left pleural effusion. Some improvement in left lung aeration primarily at the apex. No pneumothorax. Stable nodule overlying the mid right lung. Unchanged mild right basilar interstitial prominence. Similar cardiomediastinal contours. IMPRESSION: Some improvement of left lung aeration after thoracentesis. No pneumothorax. Persistent large left pleural effusion with associated atelectasis and possible underlying consolidation. Unchanged right basilar interstitial prominence and right mid lung nodule. Electronically Signed   By: Macy Mis M.D.   On: 09/30/2020 09:48   DG Chest 2 View  Result Date: 10/08/2020 CLINICAL DATA:  Pleural effusion. EXAM: CHEST - 2 VIEW COMPARISON:  10/02/2020. FINDINGS: Worsening, now complete opacification of the left lung. Similar right perihilar and right basilar opacities. Cardiomediastinal silhouette is largely obscured. No acute osseous abnormality. No visible pneumothorax. IMPRESSION: 1. Worsening, now complete opacification of the left lung that is concerning for a combination of pleural effusion and consolidation. 2. Similar right perihilar and right basilar opacities. Electronically Signed   By: Margaretha Sheffield MD   On: 10/08/2020 07:59   DG Chest 2 View  Result Date: 09/29/2020 CLINICAL DATA:  Shortness of breath EXAM: CHEST - 2 VIEW COMPARISON:  October 27, 2004 FINDINGS: There is essentially complete consolidation of the left hemithorax with probable combination of pleural effusion and extensive airspace consolidation throughout the left lung. There is a nodular opacity in the periphery of the right mid lung measuring 1.0 x 0.9 cm. There is interstitial prominence on the right which in part may be due to redistribution of blood flow to viable lung segments. Heart size appears grossly normal. Pulmonary  vascularity on the right is normal. No adenopathy is evident in areas that can be  assessed for potential adenopathy by radiography. Note that opacification on the left precludes assessment for potential adenopathy on the left. No bone lesions. IMPRESSION: Widespread airspace opacity on the left, likely due to combination of consolidation and pleural effusion. Underlying neoplasm on the left cannot be excluded. Given this circumstance, correlation with contrast enhanced chest CT may well be advisable at this time to further evaluate Nodular opacity periphery of right mid lung concerning for potential small neoplastic focus. Interstitial prominence on the right may in large part be due to redistribution of blood flow to viable lung segments. Heart size within normal limits. Electronically Signed   By: Lowella Grip III M.D.   On: 09/29/2020 16:13   CT HEAD WO CONTRAST  Result Date: 10/02/2020 CLINICAL DATA:  Cancer of unknown primary EXAM: CT HEAD WITHOUT CONTRAST TECHNIQUE: Contiguous axial images were obtained from the base of the skull through the vertex without intravenous contrast. COMPARISON:  None. FINDINGS: Brain: Multiple acquisitions required due to patient motion artifact. Hypoattenuating foci are seen subcortical white matter of the right frontal lobe (8/8 and right insula (8/1) without significant surrounding vasogenic edema. Additional hyperattenuating focus seen near the gray-white interface of the high left frontal lobe (10/40). With some questionable surrounding vasogenic edema. There is a background of chronic microvascular angiopathy and parenchymal volume loss. No other acute intracranial abnormality is seen. No significant mass effect or midline shift is evident at this time. Vascular: Atherosclerotic calcification of the carotid siphons and intradural vertebral arteries. No hyperdense vessel. Skull: No calvarial fracture or suspicious osseous lesion. No scalp swelling or hematoma.  Sinuses/Orbits: Paranasal sinuses and mastoid air cells are predominantly clear. Included orbital structures are unremarkable. Other: None. IMPRESSION: Severely motion degraded imaging despite multiple attempts at acquisition. Ill-defined hyperattenuating focus near the gray-white junction in the high left frontal lobe with some questionable surrounding hypoattenuation, possibly vasogenic edema raises concern for potential metastatic focus in the setting of cancer of unknown primary. Consider further evaluation with contrast enhanced MRI as patient is able to tolerate. May require sedation given extensive motion artifact on this CT imaging. Hypoattenuating foci in the right frontal lobe and insula, could reflect remote sequela of prior ischemia or infarct. Though could be better assessed on MR imaging as well. Electronically Signed   By: Lovena Le M.D.   On: 10/02/2020 19:37   CT CHEST WO CONTRAST  Result Date: 10/02/2020 CLINICAL DATA:  59 year old male with cancer of unknown primary. EXAM: CT CHEST, ABDOMEN AND PELVIS WITHOUT CONTRAST TECHNIQUE: Multidetector CT imaging of the chest, abdomen and pelvis was performed following the standard protocol without IV contrast. COMPARISON:  Chest radiograph dated 10/02/2020. chest CT dated 09/29/2020. FINDINGS: Evaluation of this exam is limited in the absence of intravenous contrast. CT CHEST FINDINGS Cardiovascular: There is no cardiomegaly or pericardial effusion. The thoracic aorta and central pulmonary arteries are grossly unremarkable. Mediastinum/Nodes: Mildly enlarged right paratracheal lymph node measures 12 mm. The esophagus and the thyroid gland are grossly unremarkable. No mediastinal fluid collection. Lungs/Pleura: There is a 10.0 x 7.5 x 12.0 cm mass in the left upper lobe. There is diffuse interstitial coarsening and nodularity of the left lung with smaller scattered nodules concerning for metastatic spread. There is a small left pleural effusion,  likely malignant effusion. There is consolidative changes of the majority of the left lung which may be combination of atelectasis, infiltrate, or metastasis. Several scattered nodules in the right lung measure up to 1 cm consistent with metastatic disease. There is  a patchy area of nodular and ground-glass density in the right infrahilar region which may represent pneumonia, or aspiration. Metastasis is not excluded. There is background of moderate to severe centrilobular emphysema. No pneumothorax. There is compression and high-grade narrowing of the left lower lobe bronchus and occlusion of the left upper lobe bronchus by the left lung mass. There is apparent nodular thickening of the left pleural consistent with metastatic implant. Musculoskeletal: There is sclerotic lesion in the T6 as well as sclerotic changes of C7. Small sclerotic focus in T9. CT ABDOMEN PELVIS FINDINGS No intra-abdominal free air or free fluid. Hepatobiliary: Multiple hepatic hypodense lesions consistent with metastatic disease. These measure up to 2.5 cm in the left lobe of the liver. 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 No calcified gallstone or pericholecystic fluid. Pancreas: The pancreas is grossly unremarkable. Spleen: Heterogeneous appearance of the spleen. Adrenals/Urinary Tract: The adrenal glands are unremarkable. Multiple nonobstructing bilateral renal calculi measure up to 8 mm in the inferior pole of the right kidney. There is no hydronephrosis on either side. There is a 6 cm right renal cyst. The visualized ureters and urinary bladder appear unremarkable. Stomach/Bowel: There is circumferential thickening of the rectosigmoid involving approximately 9 cm segment (95/8) which may be inflammatory in etiology. There is however apparent shouldering of the soft tissue (sagittal 93/8) concerning for underlying malignancy. Clinical correlation and further evaluation with sigmoidoscopy or colonoscopy is  recommended. There is moderate stool throughout the colon. There is colonic diverticulosis without active inflammatory changes. There is no bowel obstruction. The appendix is normal. Vascular/Lymphatic: The abdominal aorta and IVC unremarkable. No portal venous gas. No adenopathy. Reproductive: The prostate and seminal vesicles are grossly unremarkable. Other: Subcutaneous edema of the lateral pelvic wall. No fluid collection. Musculoskeletal: Faint irregular lucency involving the left superior pubic ramus concerning for metastatic disease. Several scattered small sclerotic lesions also noted involving the right ischium, sacral bone also concerning for metastatic disease. No acute fracture. IMPRESSION: 1. Large left upper lobe mass with findings of metastatic disease in the lungs, liver, and bones. 2. Circumferential thickening of the rectosigmoid with apparent shouldering concerning for underlying malignancy. Clinical correlation and further evaluation with sigmoidoscopy or colonoscopy is recommended. 3. Nonobstructing bilateral renal calculi. No hydronephrosis. 4. Colonic diverticulosis. No bowel obstruction. Normal appendix. 5. Aortic Atherosclerosis (ICD10-I70.0) and Emphysema (ICD10-J43.9). Electronically Signed   By: Anner Crete M.D.   On: 10/02/2020 19:46   CT Chest Wo Contrast  Result Date: 09/29/2020 CLINICAL DATA:  Chest pain and shortness of breath worsening for the past 2 months. Evaluation for malignancy, infection, mass, fluid. EXAM: CT CHEST WITHOUT CONTRAST TECHNIQUE: Multidetector CT imaging of the chest was performed following the standard protocol without IV contrast. COMPARISON:  Chest x-ray 09/29/2020, chest x-ray 10/27/2004 FINDINGS: Cardiovascular: Normal heart size. Trace pericardial effusion. The aortic root appears to be normal in caliber. Suggestion of possible mild aortic valve leaflet calcifications. The ascending thoracic aorta is enlarged in caliber measuring up to 4.5 cm.  The descending thoracic aorta is normal in caliber. Mild atherosclerotic plaque of the thoracic aorta. Possible mild left anterior descending coronary artery calcification. Mediastinum/Nodes: There is a 1.1 cm infra cardiac enlarged round lymph node (2:127). Enlarged mediastinal lymph nodes with as an example a right 1 cm paratracheal lymph node (2: 44) and a enlarged 1.1 cm subcarinal lymph node (2:65). Limited sensitivity for the detection of hilar adenopathy on this noncontrast study. No axillary lymphadenopathy bilaterally. Lungs/Pleura: At least mild paraseptal and  severe centrilobular emphysematous changes. Almost complete collapse of the left upper and lower lobes. There is a 1 cm solid round pulmonary nodule within the right middle lobe (4:83,99). Slightly more inferior, there there are a 0.6 cm right middle lobe pulmonary nodules (4:89). Several pulmonary micronodules at the right apex (4:17, 24, 31). Couple of round pulmonary nodule within the right lower lobe measuring 0.5 and 0.6 cm (4:1 16-117). Ground-glass subpleural nodule measuring 0.7 cm in the right middle lobe (4:96). Peribronchovascular nodular like 3.2 cm ground-glass airspace opacity within the right lower lobe (4:111). Diffuse mild bronchial wall thickening. Moderate to large volume simple fluid left pleural effusion. No right pleural effusion. No pneumothorax. Upper Abdomen: Suggestion of a partially visualized splenule. Otherwise no acute abnormality. Musculoskeletal: Indeterminate 1 cm subcutaneus soft tissue density within the left anterolateral back at the level of scapula (2:55). Several other subcentimeter soft tissue densities within the left lower back (2:68, 99). Possible seroma findings that is collimated off view within the right anterolateral back (2:52). Sclerotic thoracic vertebra lesions within the T6, T9, T11, T12 vertebral bodies. Possible tiny sclerotic foci within the left aspect of the T7 vertebral body (6:103). Similarly  within the left aspect of the T4 and T5 vertebral bodies. Sclerotic lesion within the right transverse process of the T3 vertebral body (4:10). No acute displaced fracture. Multilevel mild degenerative changes of the spine. IMPRESSION: 1. Constellation of findings consistent with metastatic malignancy. 2. Moderate to large volume left pleural effusion with almost complete collapse of the left upper and lower lobes. Associated mediastinal lymphadenopathy and limited evaluation for hilar lymphadenopathy on this noncontrast study. Underlying malignancy suspected (ddx includes metastasis or primary lung). Consider analysis of the pleural fluid for further evaluation. 3. Indeterminate multifocal right lung pulmonary nodules measuring up to 1 cm in the right middle lobe. 4. Indeterminate 3.2 cm peribronchovascular ground-glass airspace opacity within the right lower lobe. 5. Scattered suspicious sclerotic lesions concerning for osseous metastases. 6. Indeterminate pericentimeter subcutaneus soft tissue densities within the bilateral anterolateral back at the level of scapulas. 7. Severe emphysematous changes. 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 9. Aortic Atherosclerosis (ICD10-I70.0) and Emphysema (ICD10-J43.9). Electronically Signed   By: Iven Finn M.D.   On: 09/29/2020 17:19   MR BRAIN WO CONTRAST  Addendum Date: 10/09/2020   ADDENDUM REPORT: 10/09/2020 16:31 ADDENDUM: Study discussed by telephone with Dr. Lesia Sago on 10/09/2020 at 1620 hours. Electronically Signed   By: Genevie Ann M.D.   On: 10/09/2020 16:31   Result Date: 10/09/2020 CLINICAL DATA:  59 year old male with altered mental status, confusion. Metastatic disease unknown primary, left lung biopsy results pending. EXAM: MRI HEAD WITHOUT CONTRAST TECHNIQUE: Multiplanar, multiecho pulse sequences of the brain and surrounding structures were obtained without intravenous contrast. COMPARISON:  Head CT 10/02/2020. CT Chest, Abdomen,  and Pelvis 10/02/2020. FINDINGS: Brain: Widespread abnormal restricted diffusion throughout the brain. Lesions range from punctate to patchy, confluent areas. Superior frontal and parietal lobes among those most severely affected (series 5, image 93). Largest diffusion restricted lesion is in the left occipital pole, 3 cm (series 5, image 79). Bilateral cerebellar involvement greater on the left (2.4 cm). Left basal ganglia affected. Other deep gray nuclei and brainstem appear spared. On T2 and FLAIR imaging these areas appear is gyriform and subcortical hyperintense signal in keeping with cytotoxic edema. Some areas demonstrate central cystic change and appear more subacute (series 5, image 90). Occasional rounded areas of signal abnormality are noted (left cerebellum series  11, image 5). SWI demonstrates microhemorrhage at many of the affected sites. And also chronic superimposed microhemorrhage such as at the left dorsal thalamus on series 14 image 23. No malignant hemorrhagic transformation. No extra-axial or intraventricular blood. No ventriculomegaly. No intracranial mass effect or midline shift. Normal basilar cisterns. Cervicomedullary junction and pituitary are within normal limits. Vascular: Major intracranial vascular flow voids are preserved. Generalized intracranial artery tortuosity. Skull and upper cervical spine: Visualized bone marrow signal is within normal limits. Partially visible cervical spine degeneration. Sinuses/Orbits: Negative. Other: Mastoids are clear. Grossly normal visible internal auditory structures. Visible scalp and face soft tissues appear negative. IMPRESSION: 1. 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 Many of the areas demonstrate associated petechial hemorrhage - raising the possibility of septic emboli which are prone to bleeding - but there is no malignant hemorrhagic transformation or  significant intracranial mass effect. 2. No strong evidence of metastatic disease to the brain on this non-contrast exam. Note that some of the above infarcts will likely enhance on subsequent MRI without and with contrast, which will confound staging for metastases to a degree. Electronically Signed: By: Genevie Ann M.D. On: 10/09/2020 16:10   CARDIAC CATHETERIZATION  Result Date: 10/04/2020  Dist LAD lesion is 50% stenosed.  1st RPL lesion is 20% stenosed.  Mid RCA lesion is 25% stenosed.  2nd Diag lesion is 40% stenosed.  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 No severe lesions or culprit for NSTEMI - suspect demand ischemia   DG CHEST PORT 1 VIEW  Result Date: 10/10/2020 CLINICAL DATA:  Tachycardia, shortness of breath EXAM: PORTABLE CHEST 1 VIEW COMPARISON:  10/08/2020 FINDINGS: Continued complete opacification of the left hemithorax, unchanged. Right basilar patchy airspace opacity again noted, unchanged. No visible effusion on the right. No acute bony abnormality. IMPRESSION: No significant change since prior study. Electronically Signed   By: Rolm Baptise M.D.   On: 10/10/2020 12:27   DG CHEST PORT 1 VIEW  Result Date: 10/01/2020 CLINICAL DATA:  Cough, shortness of breath EXAM: PORTABLE CHEST 1 VIEW COMPARISON:  09/30/2020 chest radiograph and prior. FINDINGS: Increased size of large left pleural effusion with decreased left upper lung aeration. Right mid lung nodular opacity is unchanged. No pneumothorax. Right perihilar and basilar opacities, unchanged. Partial obscured cardiomediastinal silhouette. IMPRESSION: 1. Increased size of large left pleural effusion with decreased left upper lung aeration. 2. Right perihilar/basilar opacities are grossly  unchanged. Electronically Signed   By: Primitivo Gauze M.D.   On: 10/01/2020 10:42   ECHOCARDIOGRAM COMPLETE  Result Date: 09/30/2020    ECHOCARDIOGRAM REPORT   Patient Name:   Brett Medina Date of Exam: 09/30/2020 Medical Rec #:  [id number]     Height:       69.0 in Accession #:    [id number]    Weight:       214.3 lb Date of Birth:  03/11/1961      BSA:          2.127 m Patient Age:    46 years      BP:           151/111 mmHg Patient Gender: M             HR:  111 bpm. Exam Location:  Inpatient Procedure: 2D Echo, Cardiac Doppler and Color Doppler Indications:    Elevated Troponin  History:        Patient has no prior history of Echocardiogram examinations.                 Risk Factors:Former Smoker.  Sonographer:    Vickie Epley RDCS Referring Phys: [id number]  1. Left ventricular ejection fraction, by estimation, is 40 to 45%. The left ventricle has mildly decreased function. The left ventricle demonstrates global hypokinesis. Left ventricular diastolic parameters are indeterminate.  2. Right ventricular systolic function is normal. The right ventricular size is normal. Tricuspid regurgitation signal is inadequate for assessing PA pressure.  3. The mitral valve is normal in structure. No evidence of mitral valve regurgitation. No evidence of mitral stenosis.  4. The aortic valve was not well visualized. Aortic valve regurgitation is trivial. No aortic stenosis is present.  5. Aortic dilatation noted. There is mild dilatation of the ascending aorta, measuring 37 mm.  6. The inferior vena cava is dilated in size with <50% respiratory variability, suggesting right atrial pressure of 15 mmHg. FINDINGS  Left Ventricle: Left ventricular ejection fraction, by estimation, is 40 to 45%. The left ventricle has mildly decreased function. The left ventricle demonstrates global hypokinesis. Definity contrast agent was given IV to delineate the left ventricular  endocardial borders.  The left ventricular internal cavity size was normal in size. There is no left ventricular hypertrophy. Left ventricular diastolic parameters are indeterminate. Right Ventricle: The right ventricular size is normal. No increase in right ventricular wall thickness. Right ventricular systolic function is normal. Tricuspid regurgitation signal is inadequate for assessing PA pressure. Left Atrium: Left atrial size was normal in size. Right Atrium: Right atrial size was not well visualized. Pericardium: There is no evidence of pericardial effusion. Mitral Valve: The mitral valve is normal in structure. No evidence of mitral valve regurgitation. No evidence of mitral valve stenosis. Tricuspid Valve: The tricuspid valve is normal in structure. Tricuspid valve regurgitation is trivial. Aortic Valve: The aortic valve was not well visualized. Aortic valve regurgitation is trivial. No aortic stenosis is present. Pulmonic Valve: The pulmonic valve was not well visualized. Pulmonic valve regurgitation is not visualized. Aorta: The aortic root is normal in size and structure and aortic dilatation noted. There is mild dilatation of the ascending aorta, measuring 37 mm. Venous: The inferior vena cava is dilated in size with less than 50% respiratory variability, suggesting right atrial pressure of 15 mmHg. IAS/Shunts: The interatrial septum was not well visualized.  LEFT VENTRICLE PLAX 2D LVIDd:         5.10 cm      Diastology LVIDs:         4.40 cm      LV e' medial:    4.46 cm/s LV PW:         0.90 cm      LV E/e' medial:  14.5 LV IVS:        0.90 cm      LV e' lateral:   7.07 cm/s LVOT diam:     2.10 cm      LV E/e' lateral: 9.1 LV SV:         50 LV SV Index:   23 LVOT Area:     3.46 cm  LV Volumes (MOD) LV vol d, MOD A2C: 102.0 ml LV vol d, MOD A4C: 131.0 ml LV vol s, MOD  A2C: 68.2 ml LV vol s, MOD A4C: 81.5 ml LV SV MOD A2C:     33.8 ml LV SV MOD A4C:     131.0 ml LV SV MOD BP:      41.2 ml RIGHT VENTRICLE RV S prime:      19.40 cm/s TAPSE (M-mode): 2.2 cm LEFT ATRIUM             Index       RIGHT ATRIUM           Index LA diam:        2.30 cm 1.08 cm/m  RA Area:     11.50 cm LA Vol (A2C):   36.2 ml 17.02 ml/m RA Volume:   28.90 ml  13.58 ml/m LA Vol (A4C):   16.7 ml 7.85 ml/m LA Biplane Vol: 27.1 ml 12.74 ml/m  AORTIC VALVE LVOT Vmax:   99.20 cm/s LVOT Vmean:  73.000 cm/s LVOT VTI:    0.143 m  AORTA Ao Root diam: 3.90 cm Ao Asc diam:  3.70 cm Ao Desc diam: 2.60 cm MITRAL VALVE MV Area (PHT): 9.85 cm     SHUNTS MV Decel Time: 77 msec      Systemic VTI:  0.14 m MV E velocity: 64.50 cm/s   Systemic Diam: 2.10 cm MV A velocity: 101.00 cm/s MV E/A ratio:  0.64 Oswaldo Milian MD Electronically signed by Oswaldo Milian MD Signature Date/Time: 09/30/2020/9:27:23 PM    Final    VAS US CAROTID  Result Date: 10/10/2020 Carotid Arterial Duplex Study Indications:       CVA. Limitations        Today's exam was limited due to the patient's respiratory                    variation. Comparison Study:  no prior Performing Technologist: Abram Sander RVS  Examination Guidelines: A complete evaluation includes B-mode imaging, spectral Doppler, color Doppler, and power Doppler as needed of all accessible portions of each vessel. Bilateral testing is considered an integral part of a complete examination. Limited examinations for reoccurring indications may be performed as noted.  Right Carotid Findings: +----------+--------+--------+--------+------------------+--------+           PSV cm/sEDV cm/sStenosisPlaque DescriptionComments +----------+--------+--------+--------+------------------+--------+ CCA Prox  66      7               heterogenous               +----------+--------+--------+--------+------------------+--------+ CCA Distal73      14              heterogenous               +----------+--------+--------+--------+------------------+--------+ ICA Prox  36      9       1-39%   heterogenous                +----------+--------+--------+--------+------------------+--------+ ICA Distal37      17                                         +----------+--------+--------+--------+------------------+--------+ ECA       87      9                                          +----------+--------+--------+--------+------------------+--------+ +----------+--------+-------+--------+-------------------+  PSV cm/sEDV cmsDescribeArm Pressure (mmHG) +----------+--------+-------+--------+-------------------+ [id number]                                         +----------+--------+-------+--------+-------------------+ +---------+--------+--+--------+--+---------+ VertebralPSV cm/s73EDV cm/s19Antegrade +---------+--------+--+--------+--+---------+  Left Carotid Findings: +----------+--------+--------+--------+------------------+--------+           PSV cm/sEDV cm/sStenosisPlaque DescriptionComments +----------+--------+--------+--------+------------------+--------+ CCA Prox  47      11              heterogenous               +----------+--------+--------+--------+------------------+--------+ CCA Distal69      14              heterogenous               +----------+--------+--------+--------+------------------+--------+ ICA Prox  45      14      1-39%   heterogenous               +----------+--------+--------+--------+------------------+--------+ ICA Distal76      26                                         +----------+--------+--------+--------+------------------+--------+ ECA       104                                                +----------+--------+--------+--------+------------------+--------+ +----------+--------+--------+--------+-------------------+           PSV cm/sEDV cm/sDescribeArm Pressure (mmHG) +----------+--------+--------+--------+-------------------+ Subclavian70                                           +----------+--------+--------+--------+-------------------+ +---------+--------+--+--------+--+---------+ VertebralPSV cm/s37EDV cm/s11Antegrade +---------+--------+--+--------+--+---------+   Summary: Right Carotid: Velocities in the right ICA are consistent with a 1-39% stenosis. Left Carotid: Velocities in the left ICA are consistent with a 1-39% stenosis. Vertebrals: Bilateral vertebral arteries demonstrate antegrade flow. *See table(s) above for measurements and observations.  Electronically signed by Antony Contras MD on 10/10/2020 at 12:17:14 PM.    Final    VAS [place] LOWER EXTREMITY VENOUS (DVT)  Result Date: 10/01/2020  Lower Venous DVT Study Indications: Edema.  Comparison Study: no prior Performing Technologist: Abram Sander RVS  Examination Guidelines: A complete evaluation includes B-mode imaging, spectral Doppler, color Doppler, and power Doppler as needed of all accessible portions of each vessel. Bilateral testing is considered an integral part of a complete examination. Limited examinations for reoccurring indications may be performed as noted. The reflux portion of the exam is performed with the patient in reverse Trendelenburg.  +---------+---------------+---------+-----------+----------+--------------+ RIGHT    CompressibilityPhasicitySpontaneityPropertiesThrombus Aging +---------+---------------+---------+-----------+----------+--------------+ CFV      Full           Yes      Yes                                 +---------+---------------+---------+-----------+----------+--------------+ SFJ      Full                                                        +---------+---------------+---------+-----------+----------+--------------+  FV Prox  Full                                                        +---------+---------------+---------+-----------+----------+--------------+ FV Mid   Full                                                         +---------+---------------+---------+-----------+----------+--------------+ FV DistalFull                                                        +---------+---------------+---------+-----------+----------+--------------+ PFV      Full                                                        +---------+---------------+---------+-----------+----------+--------------+ POP      Full           Yes      Yes                                 +---------+---------------+---------+-----------+----------+--------------+ PTV      Full                                                        +---------+---------------+---------+-----------+----------+--------------+ PERO     Full                                                        +---------+---------------+---------+-----------+----------+--------------+   +---------+---------------+---------+-----------+----------+--------------+ LEFT     CompressibilityPhasicitySpontaneityPropertiesThrombus Aging +---------+---------------+---------+-----------+----------+--------------+ CFV      Full           Yes      Yes                                 +---------+---------------+---------+-----------+----------+--------------+ SFJ      Full                                                        +---------+---------------+---------+-----------+----------+--------------+ FV Prox  Full                                                        +---------+---------------+---------+-----------+----------+--------------+  FV Mid   Full                                                        +---------+---------------+---------+-----------+----------+--------------+ FV DistalFull                                                        +---------+---------------+---------+-----------+----------+--------------+ PFV      Full                                                         +---------+---------------+---------+-----------+----------+--------------+ POP      Full           Yes      Yes                                 +---------+---------------+---------+-----------+----------+--------------+ PTV      Full                                                        +---------+---------------+---------+-----------+----------+--------------+ PERO     Full                                                        +---------+---------------+---------+-----------+----------+--------------+     Summary: BILATERAL: - No evidence of deep vein thrombosis seen in the lower extremities, bilaterally. - No evidence of superficial venous thrombosis in the lower extremities, bilaterally. -No evidence of popliteal cyst, bilaterally.   *See table(s) above for measurements and observations. Electronically signed by Deitra Mayo MD on 10/01/2020 at 2:08:23 PM.    Final    VAS [place] TRANSCRANIAL DOPPLER  Result Date: 10/10/2020  Transcranial Doppler with Bubble Indications: Stroke. Performing Technologist: Abram Sander RVS  Examination Guidelines: A complete evaluation includes B-mode imaging, spectral Doppler, color Doppler, and power Doppler as needed of all accessible portions of each vessel. Bilateral testing is considered an integral part of a complete examination. Limited examinations for reoccurring indications may be performed as noted.  +----------+-------------+----------+-----------+-------+ RIGHT TCD Right VM (cm)Depth (cm)PulsatilityComment +----------+-------------+----------+-----------+-------+ MCA           21.00                 1.28            +----------+-------------+----------+-----------+-------+ Term ICA      33.00                 1.10            +----------+-------------+----------+-----------+-------+ ICA siphon    27.00  1.11            +----------+-------------+----------+-----------+-------+   +----------+------------+----------+-----------+-------+ LEFT TCD  Left VM (cm)Depth (cm)PulsatilityComment +----------+------------+----------+-----------+-------+ MCA          25.00                 1.36            +----------+------------+----------+-----------+-------+ Term ICA     14.00                 1.27            +----------+------------+----------+-----------+-------+ PCA          14.00                 0.89            +----------+------------+----------+-----------+-------+ Opthalmic    24.00                 1.52            +----------+------------+----------+-----------+-------+ ICA siphon   28.00                 1.05            +----------+------------+----------+-----------+-------+  Summary:  Poor bitemporal and absent suboccipital window limit exam.Low normal mean flow velocities in majority of identified vessels of anterior circulation of unclear significance *See table(s) above for TCD measurements and observations.  Diagnosing physician: Antony Contras MD Electronically signed by Antony Contras MD on 10/10/2020 at 12:18:38 PM.    Final    IR THORACENTESIS ASP PLEURAL SPACE W/IMG GUIDE  Result Date: 10/02/2020 INDICATION: Patient with suspected lung cancer and recurrent left pleural effusions. Interventional radiology asked to perform a therapeutic thoracentesis. EXAM: ULTRASOUND GUIDED THORACENTESIS MEDICATIONS: 1% lidocaine 10 mL COMPLICATIONS: None immediate. PROCEDURE: An ultrasound guided thoracentesis was thoroughly discussed with the patient and questions answered. The benefits, risks, alternatives and complications were also discussed. The patient understands and wishes to proceed with the procedure. Written consent was obtained. Ultrasound was performed to localize and mark an adequate pocket of fluid in the left chest. The area was then prepped and draped in the normal sterile fashion. 1% Lidocaine was used for local anesthesia. Under ultrasound guidance  a 6 Fr Safe-T-Centesis catheter was introduced. Thoracentesis was performed. The catheter was removed and a dressing applied. FINDINGS: A total of approximately 600 mL of maroon-colored fluid was removed. IMPRESSION: Successful ultrasound guided left thoracentesis yielding 600 mL of pleural fluid. Read by: Soyla Dryer, NP Electronically Signed   By: Markus Daft M.D.   On: 10/02/2020 09:28   IR THORACENTESIS ASP PLEURAL SPACE W/IMG GUIDE  Result Date: 09/30/2020 INDICATION: Shortness of breath. Large left pleural effusion. Request for diagnostic therapeutic thoracentesis. EXAM: ULTRASOUND GUIDED LEFT THORACENTESIS MEDICATIONS: 1% plain lidocaine, 5 mL COMPLICATIONS: None immediate. PROCEDURE: An ultrasound guided thoracentesis was thoroughly discussed with the patient and questions answered. The benefits, risks, alternatives and complications were also discussed. The patient understands and wishes to proceed with the procedure. Written consent was obtained. Ultrasound was performed to localize and mark an adequate pocket of fluid in the left chest. The area was then prepped and draped in the normal sterile fashion. 1% Lidocaine was used for local anesthesia. Under ultrasound guidance a 6 Fr Safe-T-Centesis catheter was introduced. Thoracentesis was performed. The catheter was removed and a dressing applied. FINDINGS: A total of approximately 950 mL of dark, old bloody fluid was removed. Samples were sent to the laboratory as requested  by the clinical team. IMPRESSION: Successful ultrasound guided left thoracentesis yielding 950 mL of pleural fluid. Read by: Ascencion Dike PA-C Electronically Signed   By: Jerilynn Mages.  Shick M.D.   On: 09/30/2020 10:13     CT ABDOMEN PELVIS WO CONTRAST  Result Date: 10/02/2020 CLINICAL DATA:  59 year old male with cancer of unknown primary. EXAM: CT CHEST, ABDOMEN AND PELVIS WITHOUT CONTRAST TECHNIQUE: Multidetector CT imaging of the chest, abdomen and pelvis was performed  following the standard protocol without IV contrast. COMPARISON:  Chest radiograph dated 10/02/2020. chest CT dated 09/29/2020. FINDINGS: Evaluation of this exam is limited in the absence of intravenous contrast. CT CHEST FINDINGS Cardiovascular: There is no cardiomegaly or pericardial effusion. The thoracic aorta and central pulmonary arteries are grossly unremarkable. Mediastinum/Nodes: Mildly enlarged right paratracheal lymph node measures 12 mm. The esophagus and the thyroid gland are grossly unremarkable. No mediastinal fluid collection. Lungs/Pleura: There is a 10.0 x 7.5 x 12.0 cm mass in the left upper lobe. There is diffuse interstitial coarsening and nodularity of the left lung with smaller scattered nodules concerning for metastatic spread. There is a small left pleural effusion, likely malignant effusion. There is consolidative changes of the majority of the left lung which may be combination of atelectasis, infiltrate, or metastasis. Several scattered nodules in the right lung measure up to 1 cm consistent with metastatic disease. There is a patchy area of nodular and ground-glass density in the right infrahilar region which may represent pneumonia, or aspiration. Metastasis is not excluded. There is background of moderate to severe centrilobular emphysema. No pneumothorax. There is compression and high-grade narrowing of the left lower lobe bronchus and occlusion of the left upper lobe bronchus by the left lung mass. There is apparent nodular thickening of the left pleural consistent with metastatic implant. Musculoskeletal: There is sclerotic lesion in the T6 as well as sclerotic changes of C7. Small sclerotic focus in T9. CT ABDOMEN PELVIS FINDINGS No intra-abdominal free air or free fluid. Hepatobiliary: Multiple hepatic hypodense lesions consistent with metastatic disease. These measure up to 2.5 cm in the left lobe of the liver. 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 No  calcified gallstone or pericholecystic fluid. Pancreas: The pancreas is grossly unremarkable. Spleen: Heterogeneous appearance of the spleen. Adrenals/Urinary Tract: The adrenal glands are unremarkable. Multiple nonobstructing bilateral renal calculi measure up to 8 mm in the inferior pole of the right kidney. There is no hydronephrosis on either side. There is a 6 cm right renal cyst. The visualized ureters and urinary bladder appear unremarkable. Stomach/Bowel: There is circumferential thickening of the rectosigmoid involving approximately 9 cm segment (95/8) which may be inflammatory in etiology. There is however apparent shouldering of the soft tissue (sagittal 93/8) concerning for underlying malignancy. Clinical correlation and further evaluation with sigmoidoscopy or colonoscopy is recommended. There is moderate stool throughout the colon. There is colonic diverticulosis without active inflammatory changes. There is no bowel obstruction. The appendix is normal. Vascular/Lymphatic: The abdominal aorta and IVC unremarkable. No portal venous gas. No adenopathy. Reproductive: The prostate and seminal vesicles are grossly unremarkable. Other: Subcutaneous edema of the lateral pelvic wall. No fluid collection. Musculoskeletal: Faint irregular lucency involving the left superior pubic ramus concerning for metastatic disease. Several scattered small sclerotic lesions also noted involving the right ischium, sacral bone also concerning for metastatic disease. No acute fracture. IMPRESSION: 1. Large left upper lobe mass with findings of metastatic disease in the lungs, liver, and bones. 2. Circumferential thickening of the rectosigmoid with  apparent shouldering concerning for underlying malignancy. Clinical correlation and further evaluation with sigmoidoscopy or colonoscopy is recommended. 3. Nonobstructing bilateral renal calculi. No hydronephrosis. 4. Colonic diverticulosis. No bowel obstruction. Normal appendix. 5.  Aortic Atherosclerosis (ICD10-I70.0) and Emphysema (ICD10-J43.9). Electronically Signed   By: Anner Crete M.D.   On: 10/02/2020 19:46   DG Chest 1 View  Result Date: 10/02/2020 CLINICAL DATA:  Status post thoracentesis EXAM: CHEST  1 VIEW COMPARISON:  October 01, 2020 FINDINGS: No pneumothorax. There remains extensive opacity throughout the left lung, largely due to airspace consolidation but with questionable residual pleural effusion. Ill-defined opacity right base is stable. No new opacity evident. Stable cardiac prominence. Pulmonary vascular on the right appears unremarkable. Pulmonary vascularity on the left is obscured. No bone lesions. IMPRESSION: No pneumothorax. Opacification of most of the left hemithorax remains, likely due to consolidation with questionable residual pleural effusion. Suspect patchy pneumonia right base, stable. No new opacity evident. Cardiac silhouette appears stable. Electronically Signed   By: Lowella Grip III M.D.   On: 10/02/2020 08:50   DG Chest 1 View  Result Date: 09/30/2020 CLINICAL DATA:  Post thoracentesis EXAM: CHEST  1 VIEW COMPARISON:  09/29/2020 FINDINGS: Slightly decreased, still large left pleural effusion. Some improvement in left lung aeration primarily at the apex. No pneumothorax. Stable nodule overlying the mid right lung. Unchanged mild right basilar interstitial prominence. Similar cardiomediastinal contours. IMPRESSION: Some improvement of left lung aeration after thoracentesis. No pneumothorax. Persistent large left pleural effusion with associated atelectasis and possible underlying consolidation. Unchanged right basilar interstitial prominence and right mid lung nodule. Electronically Signed   By: Macy Mis M.D.   On: 09/30/2020 09:48   DG Chest 2 View  Result Date: 10/08/2020 CLINICAL DATA:  Pleural effusion. EXAM: CHEST - 2 VIEW COMPARISON:  10/02/2020. FINDINGS: Worsening, now complete opacification of the left lung. Similar  right perihilar and right basilar opacities. Cardiomediastinal silhouette is largely obscured. No acute osseous abnormality. No visible pneumothorax. IMPRESSION: 1. Worsening, now complete opacification of the left lung that is concerning for a combination of pleural effusion and consolidation. 2. Similar right perihilar and right basilar opacities. Electronically Signed   By: Margaretha Sheffield MD   On: 10/08/2020 07:59   DG Chest 2 View  Result Date: 09/29/2020 CLINICAL DATA:  Shortness of breath EXAM: CHEST - 2 VIEW COMPARISON:  October 27, 2004 FINDINGS: There is essentially complete consolidation of the left hemithorax with probable combination of pleural effusion and extensive airspace consolidation throughout the left lung. There is a nodular opacity in the periphery of the right mid lung measuring 1.0 x 0.9 cm. There is interstitial prominence on the right which in part may be due to redistribution of blood flow to viable lung segments. Heart size appears grossly normal. Pulmonary vascularity on the right is normal. No adenopathy is evident in areas that can be assessed for potential adenopathy by radiography. Note that opacification on the left precludes assessment for potential adenopathy on the left. No bone lesions. IMPRESSION: Widespread airspace opacity on the left, likely due to combination of consolidation and pleural effusion. Underlying neoplasm on the left cannot be excluded. Given this circumstance, correlation with contrast enhanced chest CT may well be advisable at this time to further evaluate Nodular opacity periphery of right mid lung concerning for potential small neoplastic focus. Interstitial prominence on the right may in large part be due to redistribution of blood flow to viable lung segments. Heart size within normal limits. Electronically Signed  By: Lowella Grip III M.D.   On: 09/29/2020 16:13   CT HEAD WO CONTRAST  Result Date: 10/02/2020 CLINICAL DATA:  Cancer of  unknown primary EXAM: CT HEAD WITHOUT CONTRAST TECHNIQUE: Contiguous axial images were obtained from the base of the skull through the vertex without intravenous contrast. COMPARISON:  None. FINDINGS: Brain: Multiple acquisitions required due to patient motion artifact. Hypoattenuating foci are seen subcortical white matter of the right frontal lobe (8/8 and right insula (8/1) without significant surrounding vasogenic edema. Additional hyperattenuating focus seen near the gray-white interface of the high left frontal lobe (10/40). With some questionable surrounding vasogenic edema. There is a background of chronic microvascular angiopathy and parenchymal volume loss. No other acute intracranial abnormality is seen. No significant mass effect or midline shift is evident at this time. Vascular: Atherosclerotic calcification of the carotid siphons and intradural vertebral arteries. No hyperdense vessel. Skull: No calvarial fracture or suspicious osseous lesion. No scalp swelling or hematoma. Sinuses/Orbits: Paranasal sinuses and mastoid air cells are predominantly clear. Included orbital structures are unremarkable. Other: None. IMPRESSION: Severely motion degraded imaging despite multiple attempts at acquisition. Ill-defined hyperattenuating focus near the gray-white junction in the high left frontal lobe with some questionable surrounding hypoattenuation, possibly vasogenic edema raises concern for potential metastatic focus in the setting of cancer of unknown primary. Consider further evaluation with contrast enhanced MRI as patient is able to tolerate. May require sedation given extensive motion artifact on this CT imaging. Hypoattenuating foci in the right frontal lobe and insula, could reflect remote sequela of prior ischemia or infarct. Though could be better assessed on MR imaging as well. Electronically Signed   By: Lovena Le M.D.   On: 10/02/2020 19:37   CT CHEST WO CONTRAST  Result Date:  10/02/2020 CLINICAL DATA:  59 year old male with cancer of unknown primary. EXAM: CT CHEST, ABDOMEN AND PELVIS WITHOUT CONTRAST TECHNIQUE: Multidetector CT imaging of the chest, abdomen and pelvis was performed following the standard protocol without IV contrast. COMPARISON:  Chest radiograph dated 10/02/2020. chest CT dated 09/29/2020. FINDINGS: Evaluation of this exam is limited in the absence of intravenous contrast. CT CHEST FINDINGS Cardiovascular: There is no cardiomegaly or pericardial effusion. The thoracic aorta and central pulmonary arteries are grossly unremarkable. Mediastinum/Nodes: Mildly enlarged right paratracheal lymph node measures 12 mm. The esophagus and the thyroid gland are grossly unremarkable. No mediastinal fluid collection. Lungs/Pleura: There is a 10.0 x 7.5 x 12.0 cm mass in the left upper lobe. There is diffuse interstitial coarsening and nodularity of the left lung with smaller scattered nodules concerning for metastatic spread. There is a small left pleural effusion, likely malignant effusion. There is consolidative changes of the majority of the left lung which may be combination of atelectasis, infiltrate, or metastasis. Several scattered nodules in the right lung measure up to 1 cm consistent with metastatic disease. There is a patchy area of nodular and ground-glass density in the right infrahilar region which may represent pneumonia, or aspiration. Metastasis is not excluded. There is background of moderate to severe centrilobular emphysema. No pneumothorax. There is compression and high-grade narrowing of the left lower lobe bronchus and occlusion of the left upper lobe bronchus by the left lung mass. There is apparent nodular thickening of the left pleural consistent with metastatic implant. Musculoskeletal: There is sclerotic lesion in the T6 as well as sclerotic changes of C7. Small sclerotic focus in T9. CT ABDOMEN PELVIS FINDINGS No intra-abdominal free air or free fluid.  Hepatobiliary: Multiple hepatic hypodense  lesions consistent with metastatic disease. These measure up to 2.5 cm in the left lobe of the liver. 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 No calcified gallstone or pericholecystic fluid. Pancreas: The pancreas is grossly unremarkable. Spleen: Heterogeneous appearance of the spleen. Adrenals/Urinary Tract: The adrenal glands are unremarkable. Multiple nonobstructing bilateral renal calculi measure up to 8 mm in the inferior pole of the right kidney. There is no hydronephrosis on either side. There is a 6 cm right renal cyst. The visualized ureters and urinary bladder appear unremarkable. Stomach/Bowel: There is circumferential thickening of the rectosigmoid involving approximately 9 cm segment (95/8) which may be inflammatory in etiology. There is however apparent shouldering of the soft tissue (sagittal 93/8) concerning for underlying malignancy. Clinical correlation and further evaluation with sigmoidoscopy or colonoscopy is recommended. There is moderate stool throughout the colon. There is colonic diverticulosis without active inflammatory changes. There is no bowel obstruction. The appendix is normal. Vascular/Lymphatic: The abdominal aorta and IVC unremarkable. No portal venous gas. No adenopathy. Reproductive: The prostate and seminal vesicles are grossly unremarkable. Other: Subcutaneous edema of the lateral pelvic wall. No fluid collection. Musculoskeletal: Faint irregular lucency involving the left superior pubic ramus concerning for metastatic disease. Several scattered small sclerotic lesions also noted involving the right ischium, sacral bone also concerning for metastatic disease. No acute fracture. IMPRESSION: 1. Large left upper lobe mass with findings of metastatic disease in the lungs, liver, and bones. 2. Circumferential thickening of the rectosigmoid with apparent shouldering concerning for underlying malignancy. Clinical  correlation and further evaluation with sigmoidoscopy or colonoscopy is recommended. 3. Nonobstructing bilateral renal calculi. No hydronephrosis. 4. Colonic diverticulosis. No bowel obstruction. Normal appendix. 5. Aortic Atherosclerosis (ICD10-I70.0) and Emphysema (ICD10-J43.9). Electronically Signed   By: Anner Crete M.D.   On: 10/02/2020 19:46   CT Chest Wo Contrast  Result Date: 09/29/2020 CLINICAL DATA:  Chest pain and shortness of breath worsening for the past 2 months. Evaluation for malignancy, infection, mass, fluid. EXAM: CT CHEST WITHOUT CONTRAST TECHNIQUE: Multidetector CT imaging of the chest was performed following the standard protocol without IV contrast. COMPARISON:  Chest x-ray 09/29/2020, chest x-ray 10/27/2004 FINDINGS: Cardiovascular: Normal heart size. Trace pericardial effusion. The aortic root appears to be normal in caliber. Suggestion of possible mild aortic valve leaflet calcifications. The ascending thoracic aorta is enlarged in caliber measuring up to 4.5 cm. The descending thoracic aorta is normal in caliber. Mild atherosclerotic plaque of the thoracic aorta. Possible mild left anterior descending coronary artery calcification. Mediastinum/Nodes: There is a 1.1 cm infra cardiac enlarged round lymph node (2:127). Enlarged mediastinal lymph nodes with as an example a right 1 cm paratracheal lymph node (2: 44) and a enlarged 1.1 cm subcarinal lymph node (2:65). Limited sensitivity for the detection of hilar adenopathy on this noncontrast study. No axillary lymphadenopathy bilaterally. Lungs/Pleura: At least mild paraseptal and severe centrilobular emphysematous changes. Almost complete collapse of the left upper and lower lobes. There is a 1 cm solid round pulmonary nodule within the right middle lobe (4:83,99). Slightly more inferior, there there are a 0.6 cm right middle lobe pulmonary nodules (4:89). Several pulmonary micronodules at the right apex (4:17, 24, 31). Couple of  round pulmonary nodule within the right lower lobe measuring 0.5 and 0.6 cm (4:1 16-117). Ground-glass subpleural nodule measuring 0.7 cm in the right middle lobe (4:96). Peribronchovascular nodular like 3.2 cm ground-glass airspace opacity within the right lower lobe (4:111). Diffuse mild bronchial wall thickening. Moderate to large volume simple  fluid left pleural effusion. No right pleural effusion. No pneumothorax. Upper Abdomen: Suggestion of a partially visualized splenule. Otherwise no acute abnormality. Musculoskeletal: Indeterminate 1 cm subcutaneus soft tissue density within the left anterolateral back at the level of scapula (2:55). Several other subcentimeter soft tissue densities within the left lower back (2:68, 99). Possible seroma findings that is collimated off view within the right anterolateral back (2:52). Sclerotic thoracic vertebra lesions within the T6, T9, T11, T12 vertebral bodies. Possible tiny sclerotic foci within the left aspect of the T7 vertebral body (6:103). Similarly within the left aspect of the T4 and T5 vertebral bodies. Sclerotic lesion within the right transverse process of the T3 vertebral body (4:10). No acute displaced fracture. Multilevel mild degenerative changes of the spine. IMPRESSION: 1. Constellation of findings consistent with metastatic malignancy. 2. Moderate to large volume left pleural effusion with almost complete collapse of the left upper and lower lobes. Associated mediastinal lymphadenopathy and limited evaluation for hilar lymphadenopathy on this noncontrast study. Underlying malignancy suspected (ddx includes metastasis or primary lung). Consider analysis of the pleural fluid for further evaluation. 3. Indeterminate multifocal right lung pulmonary nodules measuring up to 1 cm in the right middle lobe. 4. Indeterminate 3.2 cm peribronchovascular ground-glass airspace opacity within the right lower lobe. 5. Scattered suspicious sclerotic lesions concerning  for osseous metastases. 6. Indeterminate pericentimeter subcutaneus soft tissue densities within the bilateral anterolateral back at the level of scapulas. 7. Severe emphysematous changes. 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 9. Aortic Atherosclerosis (ICD10-I70.0) and Emphysema (ICD10-J43.9). Electronically Signed   By: Iven Finn M.D.   On: 09/29/2020 17:19   MR BRAIN WO CONTRAST  Addendum Date: 10/09/2020   ADDENDUM REPORT: 10/09/2020 16:31 ADDENDUM: Study discussed by telephone with Dr. Lesia Sago on 10/09/2020 at 1620 hours. Electronically Signed   By: Genevie Ann M.D.   On: 10/09/2020 16:31   Result Date: 10/09/2020 CLINICAL DATA:  59 year old male with altered mental status, confusion. Metastatic disease unknown primary, left lung biopsy results pending. EXAM: MRI HEAD WITHOUT CONTRAST TECHNIQUE: Multiplanar, multiecho pulse sequences of the brain and surrounding structures were obtained without intravenous contrast. COMPARISON:  Head CT 10/02/2020. CT Chest, Abdomen, and Pelvis 10/02/2020. FINDINGS: Brain: Widespread abnormal restricted diffusion throughout the brain. Lesions range from punctate to patchy, confluent areas. Superior frontal and parietal lobes among those most severely affected (series 5, image 93). Largest diffusion restricted lesion is in the left occipital pole, 3 cm (series 5, image 79). Bilateral cerebellar involvement greater on the left (2.4 cm). Left basal ganglia affected. Other deep gray nuclei and brainstem appear spared. On T2 and FLAIR imaging these areas appear is gyriform and subcortical hyperintense signal in keeping with cytotoxic edema. Some areas demonstrate central cystic change and appear more subacute (series 5, image 90). Occasional rounded areas of signal abnormality are noted (left cerebellum series 11, image 5). SWI demonstrates microhemorrhage at many of the affected sites. And also chronic superimposed microhemorrhage such as at the left  dorsal thalamus on series 14 image 23. No malignant hemorrhagic transformation. No extra-axial or intraventricular blood. No ventriculomegaly. No intracranial mass effect or midline shift. Normal basilar cisterns. Cervicomedullary junction and pituitary are within normal limits. Vascular: Major intracranial vascular flow voids are preserved. Generalized intracranial artery tortuosity. Skull and upper cervical spine: Visualized bone marrow signal is within normal limits. Partially visible cervical spine degeneration. Sinuses/Orbits: Negative. Other: Mastoids are clear. Grossly normal visible internal auditory structures. Visible scalp and face soft tissues appear  negative. IMPRESSION: 1. 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 Many of the areas demonstrate associated petechial hemorrhage - raising the possibility of septic emboli which are prone to bleeding - but there is no malignant hemorrhagic transformation or significant intracranial mass effect. 2. No strong evidence of metastatic disease to the brain on this non-contrast exam. Note that some of the above infarcts will likely enhance on subsequent MRI without and with contrast, which will confound staging for metastases to a degree. Electronically Signed: By: Genevie Ann M.D. On: 10/09/2020 16:10   CARDIAC CATHETERIZATION  Result Date: 10/04/2020  Dist LAD lesion is 50% stenosed.  1st RPL lesion is 20% stenosed.  Mid RCA lesion is 25% stenosed.  2nd Diag lesion is 40% stenosed.  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 No severe lesions or culprit for NSTEMI - suspect demand ischemia   DG CHEST PORT 1 VIEW  Result Date:  10/10/2020 CLINICAL DATA:  Tachycardia, shortness of breath EXAM: PORTABLE CHEST 1 VIEW COMPARISON:  10/08/2020 FINDINGS: Continued complete opacification of the left hemithorax, unchanged. Right basilar patchy airspace opacity again noted, unchanged. No visible effusion on the right. No acute bony abnormality. IMPRESSION: No significant change since prior study. Electronically Signed   By: Rolm Baptise M.D.   On: 10/10/2020 12:27   DG CHEST PORT 1 VIEW  Result Date: 10/01/2020 CLINICAL DATA:  Cough, shortness of breath EXAM: PORTABLE CHEST 1 VIEW COMPARISON:  09/30/2020 chest radiograph and prior. FINDINGS: Increased size of large left pleural effusion with decreased left upper lung aeration. Right mid lung nodular opacity is unchanged. No pneumothorax. Right perihilar and basilar opacities, unchanged. Partial obscured cardiomediastinal silhouette. IMPRESSION: 1. Increased size of large left pleural effusion with decreased left upper lung aeration. 2. Right perihilar/basilar opacities are grossly unchanged. Electronically Signed   By: Primitivo Gauze M.D.   On: 10/01/2020 10:42   ECHOCARDIOGRAM COMPLETE  Result Date: 09/30/2020    ECHOCARDIOGRAM REPORT   Patient Name:   Brett Medina Date of Exam: 09/30/2020 Medical Rec #:  [id number]     Height:       69.0 in Accession #:    [id number]    Weight:       214.3 lb Date of Birth:  [birth year]      BSA:          2.127 m Patient Age:    67 years      BP:           151/111 mmHg Patient Gender: M             HR:           111 bpm. Exam Location:  Inpatient Procedure: 2D Echo, Cardiac Doppler and Color Doppler Indications:    Elevated Troponin  History:        Patient has no prior history of Echocardiogram examinations.                 Risk Factors:Former Smoker.  Sonographer:    Vickie Epley RDCS Referring Phys: [id number]  1. Left ventricular ejection fraction, by estimation, is 40 to 45%. The left ventricle has mildly decreased  function. The left ventricle demonstrates global hypokinesis. Left  ventricular diastolic parameters are indeterminate.  2. Right ventricular systolic function is normal. The right ventricular size is normal. Tricuspid regurgitation signal is inadequate for assessing PA pressure.  3. The mitral valve is normal in structure. No evidence of mitral valve regurgitation. No evidence of mitral stenosis.  4. The aortic valve was not well visualized. Aortic valve regurgitation is trivial. No aortic stenosis is present.  5. Aortic dilatation noted. There is mild dilatation of the ascending aorta, measuring 37 mm.  6. The inferior vena cava is dilated in size with <50% respiratory variability, suggesting right atrial pressure of 15 mmHg. FINDINGS  Left Ventricle: Left ventricular ejection fraction, by estimation, is 40 to 45%. The left ventricle has mildly decreased function. The left ventricle demonstrates global hypokinesis. Definity contrast agent was given IV to delineate the left ventricular  endocardial borders. The left ventricular internal cavity size was normal in size. There is no left ventricular hypertrophy. Left ventricular diastolic parameters are indeterminate. Right Ventricle: The right ventricular size is normal. No increase in right ventricular wall thickness. Right ventricular systolic function is normal. Tricuspid regurgitation signal is inadequate for assessing PA pressure. Left Atrium: Left atrial size was normal in size. Right Atrium: Right atrial size was not well visualized. Pericardium: There is no evidence of pericardial effusion. Mitral Valve: The mitral valve is normal in structure. No evidence of mitral valve regurgitation. No evidence of mitral valve stenosis. Tricuspid Valve: The tricuspid valve is normal in structure. Tricuspid valve regurgitation is trivial. Aortic Valve: The aortic valve was not well visualized. Aortic valve regurgitation is trivial. No aortic stenosis is present. Pulmonic  Valve: The pulmonic valve was not well visualized. Pulmonic valve regurgitation is not visualized. Aorta: The aortic root is normal in size and structure and aortic dilatation noted. There is mild dilatation of the ascending aorta, measuring 37 mm. Venous: The inferior vena cava is dilated in size with less than 50% respiratory variability, suggesting right atrial pressure of 15 mmHg. IAS/Shunts: The interatrial septum was not well visualized.  LEFT VENTRICLE PLAX 2D LVIDd:         5.10 cm      Diastology LVIDs:         4.40 cm      LV e' medial:    4.46 cm/s LV PW:         0.90 cm      LV E/e' medial:  14.5 LV IVS:        0.90 cm      LV e' lateral:   7.07 cm/s LVOT diam:     2.10 cm      LV E/e' lateral: 9.1 LV SV:         50 LV SV Index:   23 LVOT Area:     3.46 cm  LV Volumes (MOD) LV vol d, MOD A2C: 102.0 ml LV vol d, MOD A4C: 131.0 ml LV vol s, MOD A2C: 68.2 ml LV vol s, MOD A4C: 81.5 ml LV SV MOD A2C:     33.8 ml LV SV MOD A4C:     131.0 ml LV SV MOD BP:      41.2 ml RIGHT VENTRICLE RV S prime:     19.40 cm/s TAPSE (M-mode): 2.2 cm LEFT ATRIUM             Index       RIGHT ATRIUM           Index LA diam:  2.30 cm 1.08 cm/m  RA Area:     11.50 cm LA Vol (A2C):   36.2 ml 17.02 ml/m RA Volume:   28.90 ml  13.58 ml/m LA Vol (A4C):   16.7 ml 7.85 ml/m LA Biplane Vol: 27.1 ml 12.74 ml/m  AORTIC VALVE LVOT Vmax:   99.20 cm/s LVOT Vmean:  73.000 cm/s LVOT VTI:    0.143 m  AORTA Ao Root diam: 3.90 cm Ao Asc diam:  3.70 cm Ao Desc diam: 2.60 cm MITRAL VALVE MV Area (PHT): 9.85 cm     SHUNTS MV Decel Time: 77 msec      Systemic VTI:  0.14 m MV E velocity: 64.50 cm/s   Systemic Diam: 2.10 cm MV A velocity: 101.00 cm/s MV E/A ratio:  0.64 Oswaldo Milian MD Electronically signed by Oswaldo Milian MD Signature Date/Time: 09/30/2020/9:27:23 PM    Final    VAS US CAROTID  Result Date: 10/10/2020 Carotid Arterial Duplex Study Indications:       CVA. Limitations        Today's exam was limited due  to the patient's respiratory                    variation. Comparison Study:  no prior Performing Technologist: Abram Sander RVS  Examination Guidelines: A complete evaluation includes B-mode imaging, spectral Doppler, color Doppler, and power Doppler as needed of all accessible portions of each vessel. Bilateral testing is considered an integral part of a complete examination. Limited examinations for reoccurring indications may be performed as noted.  Right Carotid Findings: +----------+--------+--------+--------+------------------+--------+           PSV cm/sEDV cm/sStenosisPlaque DescriptionComments +----------+--------+--------+--------+------------------+--------+ CCA Prox  66      7               heterogenous               +----------+--------+--------+--------+------------------+--------+ CCA Distal73      14              heterogenous               +----------+--------+--------+--------+------------------+--------+ ICA Prox  36      9       1-39%   heterogenous               +----------+--------+--------+--------+------------------+--------+ ICA Distal37      17                                         +----------+--------+--------+--------+------------------+--------+ ECA       87      9                                          +----------+--------+--------+--------+------------------+--------+ +----------+--------+-------+--------+-------------------+           PSV cm/sEDV cmsDescribeArm Pressure (mmHG) +----------+--------+-------+--------+-------------------+ [id number]                                         +----------+--------+-------+--------+-------------------+ +---------+--------+--+--------+--+---------+ VertebralPSV cm/s73EDV cm/s19Antegrade +---------+--------+--+--------+--+---------+  Left Carotid Findings: +----------+--------+--------+--------+------------------+--------+           PSV cm/sEDV cm/sStenosisPlaque  DescriptionComments +----------+--------+--------+--------+------------------+--------+ CCA Prox  47      11  heterogenous               +----------+--------+--------+--------+------------------+--------+ CCA Distal69      14              heterogenous               +----------+--------+--------+--------+------------------+--------+ ICA Prox  45      14      1-39%   heterogenous               +----------+--------+--------+--------+------------------+--------+ ICA Distal76      26                                         +----------+--------+--------+--------+------------------+--------+ ECA       104                                                +----------+--------+--------+--------+------------------+--------+ +----------+--------+--------+--------+-------------------+           PSV cm/sEDV cm/sDescribeArm Pressure (mmHG) +----------+--------+--------+--------+-------------------+ Subclavian70                                          +----------+--------+--------+--------+-------------------+ +---------+--------+--+--------+--+---------+ VertebralPSV cm/s37EDV cm/s11Antegrade +---------+--------+--+--------+--+---------+   Summary: Right Carotid: Velocities in the right ICA are consistent with a 1-39% stenosis. Left Carotid: Velocities in the left ICA are consistent with a 1-39% stenosis. Vertebrals: Bilateral vertebral arteries demonstrate antegrade flow. *See table(s) above for measurements and observations.  Electronically signed by Antony Contras MD on 10/10/2020 at 12:17:14 PM.    Final    VAS [place] LOWER EXTREMITY VENOUS (DVT)  Result Date: 10/01/2020  Lower Venous DVT Study Indications: Edema.  Comparison Study: no prior Performing Technologist: Abram Sander RVS  Examination Guidelines: A complete evaluation includes B-mode imaging, spectral Doppler, color Doppler, and power Doppler as needed of all accessible portions of each vessel.  Bilateral testing is considered an integral part of a complete examination. Limited examinations for reoccurring indications may be performed as noted. The reflux portion of the exam is performed with the patient in reverse Trendelenburg.  +---------+---------------+---------+-----------+----------+--------------+ RIGHT    CompressibilityPhasicitySpontaneityPropertiesThrombus Aging +---------+---------------+---------+-----------+----------+--------------+ CFV      Full           Yes      Yes                                 +---------+---------------+---------+-----------+----------+--------------+ SFJ      Full                                                        +---------+---------------+---------+-----------+----------+--------------+ FV Prox  Full                                                        +---------+---------------+---------+-----------+----------+--------------+ FV Mid  Full                                                        +---------+---------------+---------+-----------+----------+--------------+ FV DistalFull                                                        +---------+---------------+---------+-----------+----------+--------------+ PFV      Full                                                        +---------+---------------+---------+-----------+----------+--------------+ POP      Full           Yes      Yes                                 +---------+---------------+---------+-----------+----------+--------------+ PTV      Full                                                        +---------+---------------+---------+-----------+----------+--------------+ PERO     Full                                                        +---------+---------------+---------+-----------+----------+--------------+   +---------+---------------+---------+-----------+----------+--------------+ LEFT      CompressibilityPhasicitySpontaneityPropertiesThrombus Aging +---------+---------------+---------+-----------+----------+--------------+ CFV      Full           Yes      Yes                                 +---------+---------------+---------+-----------+----------+--------------+ SFJ      Full                                                        +---------+---------------+---------+-----------+----------+--------------+ FV Prox  Full                                                        +---------+---------------+---------+-----------+----------+--------------+ FV Mid   Full                                                        +---------+---------------+---------+-----------+----------+--------------+  FV DistalFull                                                        +---------+---------------+---------+-----------+----------+--------------+ PFV      Full                                                        +---------+---------------+---------+-----------+----------+--------------+ POP      Full           Yes      Yes                                 +---------+---------------+---------+-----------+----------+--------------+ PTV      Full                                                        +---------+---------------+---------+-----------+----------+--------------+ PERO     Full                                                        +---------+---------------+---------+-----------+----------+--------------+     Summary: BILATERAL: - No evidence of deep vein thrombosis seen in the lower extremities, bilaterally. - No evidence of superficial venous thrombosis in the lower extremities, bilaterally. -No evidence of popliteal cyst, bilaterally.   *See table(s) above for measurements and observations. Electronically signed by Deitra Mayo MD on 10/01/2020 at 2:08:23 PM.    Final    VAS [place] TRANSCRANIAL DOPPLER  Result Date:  10/10/2020  Transcranial Doppler with Bubble Indications: Stroke. Performing Technologist: Abram Sander RVS  Examination Guidelines: A complete evaluation includes B-mode imaging, spectral Doppler, color Doppler, and power Doppler as needed of all accessible portions of each vessel. Bilateral testing is considered an integral part of a complete examination. Limited examinations for reoccurring indications may be performed as noted.  +----------+-------------+----------+-----------+-------+ RIGHT TCD Right VM (cm)Depth (cm)PulsatilityComment +----------+-------------+----------+-----------+-------+ MCA           21.00                 1.28            +----------+-------------+----------+-----------+-------+ Term ICA      33.00                 1.10            +----------+-------------+----------+-----------+-------+ ICA siphon    27.00                 1.11            +----------+-------------+----------+-----------+-------+  +----------+------------+----------+-----------+-------+ LEFT TCD  Left VM (cm)Depth (cm)PulsatilityComment +----------+------------+----------+-----------+-------+ MCA          25.00                 1.36            +----------+------------+----------+-----------+-------+  Term ICA     14.00                 1.27            +----------+------------+----------+-----------+-------+ PCA          14.00                 0.89            +----------+------------+----------+-----------+-------+ Opthalmic    24.00                 1.52            +----------+------------+----------+-----------+-------+ ICA siphon   28.00                 1.05            +----------+------------+----------+-----------+-------+  Summary:  Poor bitemporal and absent suboccipital window limit exam.Low normal mean flow velocities in majority of identified vessels of anterior circulation of unclear significance *See table(s) above for TCD measurements and observations.   Diagnosing physician: Antony Contras MD Electronically signed by Antony Contras MD on 10/10/2020 at 12:18:38 PM.    Final    IR THORACENTESIS ASP PLEURAL SPACE W/IMG GUIDE  Result Date: 10/02/2020 INDICATION: Patient with suspected lung cancer and recurrent left pleural effusions. Interventional radiology asked to perform a therapeutic thoracentesis. EXAM: ULTRASOUND GUIDED THORACENTESIS MEDICATIONS: 1% lidocaine 10 mL COMPLICATIONS: None immediate. PROCEDURE: An ultrasound guided thoracentesis was thoroughly discussed with the patient and questions answered. The benefits, risks, alternatives and complications were also discussed. The patient understands and wishes to proceed with the procedure. Written consent was obtained. Ultrasound was performed to localize and mark an adequate pocket of fluid in the left chest. The area was then prepped and draped in the normal sterile fashion. 1% Lidocaine was used for local anesthesia. Under ultrasound guidance a 6 Fr Safe-T-Centesis catheter was introduced. Thoracentesis was performed. The catheter was removed and a dressing applied. FINDINGS: A total of approximately 600 mL of maroon-colored fluid was removed. IMPRESSION: Successful ultrasound guided left thoracentesis yielding 600 mL of pleural fluid. Read by: Soyla Dryer, NP Electronically Signed   By: Markus Daft M.D.   On: 10/02/2020 09:28   IR THORACENTESIS ASP PLEURAL SPACE W/IMG GUIDE  Result Date: 09/30/2020 INDICATION: Shortness of breath. Large left pleural effusion. Request for diagnostic therapeutic thoracentesis. EXAM: ULTRASOUND GUIDED LEFT THORACENTESIS MEDICATIONS: 1% plain lidocaine, 5 mL COMPLICATIONS: None immediate. PROCEDURE: An ultrasound guided thoracentesis was thoroughly discussed with the patient and questions answered. The benefits, risks, alternatives and complications were also discussed. The patient understands and wishes to proceed with the procedure. Written consent was obtained.  Ultrasound was performed to localize and mark an adequate pocket of fluid in the left chest. The area was then prepped and draped in the normal sterile fashion. 1% Lidocaine was used for local anesthesia. Under ultrasound guidance a 6 Fr Safe-T-Centesis catheter was introduced. Thoracentesis was performed. The catheter was removed and a dressing applied. FINDINGS: A total of approximately 950 mL of dark, old bloody fluid was removed. Samples were sent to the laboratory as requested by the clinical team. IMPRESSION: Successful ultrasound guided left thoracentesis yielding 950 mL of pleural fluid. Read by: Ascencion Dike PA-C Electronically Signed   By: Jerilynn Mages.  Shick M.D.   On: 09/30/2020 10:13    Pathology:  SURGICAL PATHOLOGY  CASE: [id number]  PATIENT: Brett Medina  Surgical Pathology Report   Clinical History: Pulmonary nodules (nt)   FINAL  MICROSCOPIC DIAGNOSIS:   A. ENDOBRONCHIAL, LEFT UPPER LOBE, BIOPSY:  - Non-small cell carcinoma, see comment.   B. ENDOBRONCHIAL, LEFT LOWER LOBE, BIOPSY:  - Non-small cell carcinoma.   COMMENT:   A. Immunohistochemistry is positive for TTF-1 and is largely negative  for cytokeratin 5/6 and NapsinA. The immunoprofile and morphology are  most consistent with lung adenocarcinoma. There is likely sufficient  tissue for ancillary studies if requested (Block A1). Dr. Jeannie Done has  reviewed the case. The case was discussed with Dr. Horris Latino on 10/10/20.  CYTOLOGY - NON PAP  CASE: [id number]  PATIENT: Brett Medina  Non-Gynecological Cytology Report   Clinical History: None provided  Specimen Submitted: A. LUNG, LEFT, BRUSHING:   FINAL MICROSCOPIC DIAGNOSIS:  - Malignant cells consistent with non-small cell carcinoma   SPECIMEN ADEQUACY:  Satisfactory for evaluation   DIAGNOSTIC COMMENTS:  Please see corresponding biopsy ([id number]) for immunohistochemistry.   GROSS:  Received is/are 4 slides in 95% ethyl alcohol.(GW:gw)  Smears: 4   Concentration Method (Thin Prep): 0  Cell Block: 0  Additional Studies: N/A   Final Diagnosis performed by Vicente Males, MD.  Electronically signed  10/10/2020    Assessment and Plan:  1.  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  Acute respiratory failure with hypoxia 3.  Malignant pleural effusion 4.  Acute systolic congestive heart failure 5.  Nonobstructive CAD 6.  Persistent sinus tachycardia 7.  Embolic stroke/shower emboli 8.  Leukocytosis 9.  Microcytic anemia 10.  AKI 11.  Atrial fibrillation  -Discussed imaging findings, biopsy results, diagnosis, and prognosis with the patient and his family.  We discussed that he has non-small cell lung cancer, adenocarcinoma.  We recommend referral to radiation oncology for consideration of palliative radiation to his lung mass.  The patient is not sure if he would like to proceed with radiation but would like to talk to radiation to have a better understanding of the role for radiation.   -Treatment options were discussed.  The patient's performance status is estimated to be 3 and he is not a candidate for systemic chemotherapy.  However, he may be a candidate for targeted therapy or immunotherapy.  We will also send tissue for foundation 1 and PD-L1 testing. -We also discussed that if he is not interested in pursuing treatment or he declines rapidly, would recommend a referral to hospice to help with symptom management. -The patient is undecided currently.  He would like to think about this and we will check back with him.  In the meantime, we will reach out to radiation oncology for consultation and send tissue for additional testing. -The patient has microcytic anemia.  Will add on additional blood work to tomorrow morning's labs including ferritin, iron studies, vitamin B12 level, and folate level.  Of note, the patient was noted to have thickening in the rectosigmoid colon concerning for  underlying malignancy.  Has not had a colonoscopy in the past.   -Recommend evaluation by PT/OT. -Recommend for palliative care to follow the patient in his home if he elects to proceed with treatment. -The patient has indicated that he does not have insurance.  Recommend social work consult to help the family initiate Medicaid application.  Thank you for this referral.   Mikey Bussing, DNP, AGPCNP-BC, AOCNP  Addendum I have seen the patient, examined him. I agree with the assessment and and plan and have edited the notes.   59 year old gentleman without significant past medical history, but  also has not had any medical care for many years, presented with progressive dyspnea for a few months. CT scan revealed a large left upper lobe lung mass, with left pleural nodules and effusion, lymph node, contralateral lung, liver, and bone metastasis.  His hospital course is also complicated by newly discovered heart failure, embolic multifocal strokes.  He is quite symptomatic with very low performance status.  I discussed imaging, bronchoscopy, and the biopsy results with patient and his family.  We discussed that his cancer is not curable, and overall prognosis is very poor. We discussed the option of palliative radiation to the primary tumor, to improve his dyspnea.  I will also request foundation one genomic testing and PD-L1 on his biopsy, to see if he is a candidate for targeted therapy or immunotherapy.  We also discussed the option of palliative care and hospice at home. Pt and his family would like to think about it.  I will reach out to RAD/ONC to see if they feel palliative radiation is appropriate. Pt lives in [place], may wants to complete RT in hospital if recommended. All questions were answered.   Truitt Merle  10/10/2020

## 2020-10-10 NOTE — Progress Notes (Addendum)
Progress Note  Patient Name: Brett Medina Date of Encounter: 10/10/2020  [facility] Cardiologist: Elouise Munroe, MD   Subjective   Feeling OK.  Denies palpitations.  He feels like he can't get a deep breath.  Inpatient Medications    Scheduled Meds: . aspirin EC  81 mg Oral Daily  . atorvastatin  40 mg Oral Daily  . digoxin  0.125 mg Intravenous Q6H  . digoxin  0.25 mg Intravenous Once  . digoxin  0.25 mg Intravenous Once  . enoxaparin (LOVENOX) injection  40 mg Subcutaneous Q24H  . mouth rinse  15 mL Mouth Rinse BID  . metoprolol succinate  100 mg Oral Daily  . mupirocin ointment  1 application Nasal BID  . sodium chloride flush  3 mL Intravenous Q12H  . sodium chloride flush  3 mL Intravenous Q12H   Continuous Infusions: . sodium chloride     PRN Meds: sodium chloride, acetaminophen **OR** acetaminophen, guaiFENesin-codeine, levalbuterol, loperamide, metoprolol tartrate, ondansetron **OR** ondansetron (ZOFRAN) IV, polyethylene glycol, sodium chloride flush   Vital Signs    Vitals:   10/10/20 0751 10/10/20 0755 10/10/20 0815 10/10/20 0911  BP: 131/74 115/89 (!) 122/94 102/84  Pulse: 62 (!) 111 (!) 41 (!) 29  Resp: (!) 24 (!) 24 (!) 22 20  Temp: 98.5 F (36.9 C)   98.4 F (36.9 C)  TempSrc: Oral   Oral  SpO2: 99% 100% 99% 96%  Weight:      Height:        Intake/Output Summary (Last 24 hours) at 10/10/2020 0952 Last data filed at 10/09/2020 1431 Gross per 24 hour  Intake 120 ml  Output 400 ml  Net -280 ml   Last 3 Weights 10/10/2020 10/09/2020 10/08/2020  Weight (lbs) 191 lb 2.2 oz 192 lb 14.4 oz 196 lb 14.4 oz  Weight (kg) 86.7 kg 87.5 kg 89.313 kg      Telemetry    MAT, atrial flutter  Short NSVT vs afib with aberrancy.  - Personally Reviewed  ECG    n/a - Personally Reviewed  Physical Exam   VS:  BP 102/84 (BP Location: Left Arm)   Pulse (!) 29   Temp 98.4 F (36.9 C) (Oral)   Resp 20   Ht 5\' 9"  (1.753 m)   Wt 86.7 kg   SpO2 96%    BMI 28.23 kg/m  , BMI Body mass index is 28.23 kg/m. GENERAL:  Ill-appearing.  Diaphoretic. HEENT: Pupils equal round and reactive, fundi not visualized, oral mucosa unremarkable NECK:  No jugular venous distention, waveform within normal limits, carotid upstroke brisk and symmetric, no bruits LUNGS: Diminished on the L base. HEART:  Tachycardic.  Irregularly irregular.  PMI not displaced or sustained,S1 and S2 within normal limits, no S3, no S4, no clicks, no rubs, no murmurs ABD:  Flat, positive bowel sounds normal in frequency in pitch, no bruits, no rebound, no guarding, no midline pulsatile mass, no hepatomegaly, no splenomegaly EXT:  2 plus pulses throughout, no edema, no cyanosis no clubbing SKIN:  No rashes no nodules NEURO:  Cranial nerves II through XII grossly intact, motor grossly intact throughout PSYCH:  Cognitively intact, oriented to person place and time   Labs    High Sensitivity Troponin:   Recent Labs  Lab 09/29/20 1635 09/29/20 2032 09/30/20 0213 10/01/20 0917  TROPONINIHS 1,087* 1,380* 1,446* 746*      Chemistry Recent Labs  Lab 10/07/20 0234 10/09/20 0208 10/10/20 0133  NA 132* 136 137  K 4.2 4.4 4.5  CL 99 101 102  CO2 24 22 27   GLUCOSE 94 103* 110*  BUN 18 21* 24*  CREATININE 1.19 1.33* 1.52*  CALCIUM 8.5* 8.5* 8.6*  PROT 5.1* 5.4* 5.1*  ALBUMIN 2.0* 2.0* 2.0*  AST 40 44* 57*  ALT 49* 41 44  ALKPHOS 224* 177* 175*  BILITOT 0.8 1.3* 0.7  GFRNONAA >60 >60 53*  ANIONGAP 9 13 8      Hematology Recent Labs  Lab 10/08/20 0121 10/09/20 0208 10/10/20 0133  WBC 12.9* 12.9* 12.7*  RBC 4.68 4.56 4.32  HGB 11.5* 11.1* 11.0*  HCT 37.0* 36.2* 34.0*  MCV 79.1* 79.4* 78.7*  MCH 24.6* 24.3* 25.5*  MCHC 31.1 30.7 32.4  RDW 16.9* 17.0* 17.1*  PLT 244 183 168    BNPNo results for input(s): BNP, PROBNP in the last 168 hours.   DDimer No results for input(s): DDIMER in the last 168 hours.   Radiology    MR BRAIN WO CONTRAST  Addendum Date:  10/09/2020   ADDENDUM REPORT: 10/09/2020 16:31 ADDENDUM: Study discussed by telephone with Dr. Lesia Sago on 10/09/2020 at 1620 hours. Electronically Signed   By: Genevie Ann M.D.   On: 10/09/2020 16:31   Result Date: 10/09/2020 CLINICAL DATA:  59 year old male with altered mental status, confusion. Metastatic disease unknown primary, left lung biopsy results pending. EXAM: MRI HEAD WITHOUT CONTRAST TECHNIQUE: Multiplanar, multiecho pulse sequences of the brain and surrounding structures were obtained without intravenous contrast. COMPARISON:  Head CT 10/02/2020. CT Chest, Abdomen, and Pelvis 10/02/2020. FINDINGS: Brain: Widespread abnormal restricted diffusion throughout the brain. Lesions range from punctate to patchy, confluent areas. Superior frontal and parietal lobes among those most severely affected (series 5, image 93). Largest diffusion restricted lesion is in the left occipital pole, 3 cm (series 5, image 79). Bilateral cerebellar involvement greater on the left (2.4 cm). Left basal ganglia affected. Other deep gray nuclei and brainstem appear spared. On T2 and FLAIR imaging these areas appear is gyriform and subcortical hyperintense signal in keeping with cytotoxic edema. Some areas demonstrate central cystic change and appear more subacute (series 5, image 90). Occasional rounded areas of signal abnormality are noted (left cerebellum series 11, image 5). SWI demonstrates microhemorrhage at many of the affected sites. And also chronic superimposed microhemorrhage such as at the left dorsal thalamus on series 14 image 23. No malignant hemorrhagic transformation. No extra-axial or intraventricular blood. No ventriculomegaly. No intracranial mass effect or midline shift. Normal basilar cisterns. Cervicomedullary junction and pituitary are within normal limits. Vascular: Major intracranial vascular flow voids are preserved. Generalized intracranial artery tortuosity. Skull and upper cervical spine:  Visualized bone marrow signal is within normal limits. Partially visible cervical spine degeneration. Sinuses/Orbits: Negative. Other: Mastoids are clear. Grossly normal visible internal auditory structures. Visible scalp and face soft tissues appear negative. IMPRESSION: 1. 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 Many of the areas demonstrate associated petechial hemorrhage - raising the possibility of septic emboli which are prone to bleeding - but there is no malignant hemorrhagic transformation or significant intracranial mass effect. 2. No strong evidence of metastatic disease to the brain on this non-contrast exam. Note that some of the above infarcts will likely enhance on subsequent MRI without and with contrast, which will confound staging for metastases to a degree. Electronically Signed: By: Genevie Ann M.D. On: 10/09/2020 16:10    Cardiac Studies   LHC 10/04/20:   Dist  LAD lesion is 50% stenosed.  1st RPL lesion is 20% stenosed.  Mid RCA lesion is 25% stenosed.  2nd Diag lesion is 40% stenosed.  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 No severe lesions or culprit for NSTEMI - suspect demand ischemia  Echo 09/30/20: 1. Left ventricular ejection fraction, by estimation, is 40 to 45%. The  left ventricle has mildly decreased function. The left ventricle  demonstrates global hypokinesis. Left ventricular diastolic parameters are  indeterminate.  2. Right ventricular systolic function is normal. The right ventricular  size is normal. Tricuspid regurgitation signal is inadequate for assessing  PA pressure.  3. The mitral valve is normal in structure. No  evidence of mitral valve  regurgitation. No evidence of mitral stenosis.  4. The aortic valve was not well visualized. Aortic valve regurgitation  is trivial. No aortic stenosis is present.  5. Aortic dilatation noted. 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  6. The inferior vena cava is dilated in size with <50% respiratory  variability, suggesting right atrial pressure of 15 mmHg.   Patient Profile     59 y.o. male with hypertension, tobacco abuse, and limited medical care admitted with exertional dyspnea and found to have hypoxic respiratory failure, acute systolic heart failure and likely metastatic lung cancer.   Assessment & Plan    # MAT:  # Atrial flutter:  Monitor shows mostly MAT and some atrial flutter.  Not a candidate for anticoagulation for now due to acute/subacute embolic stroke.  Will wait on neurology guidance for when/if to start heparin.  Given his embolic event and concern for thrombus, will avoid antiarrhythmics.  Will give digoxin 0.25 mg IV x1 then 0.125 mg q6h x 2 doses.  Schedule metoprolol 25mg  po bid.    # Embolic stroke:  Patient confused and tachycardic.  MRI showed numerous acute and subacute embolic emboli.  No evidence of brain metastasis.  No anticoagulation for now.  Appreciate neurology following.  Will likely need TEE.  # Acute systolic heart failure:  LVEF 40-45%.  Newly diagnosed this admission.  He is euvolemic on exam.  Nonobstructive disease on cath.  He is hyponatremic.  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  He was started on metoprolol and switched to succinate today.  Holding metoprolol and losartan due to tachycardia and hypotension.  # Non-obstructive CAD:  Cath revealed moderate diffuse LAD stenosis and 40% D2 lesion.  Manage medically.Continue atorvastatin.  He does have mild transaminitis.  This will need to be monitored over time.  Continue aspirin and beta blocker  as above. Marland Kitchen  #Anemia: Hemoglobin stable at 11.5.  # AKI: Stop losartan and avoid nephrotoxins.  # Hypoxia: # Likely malignancy:  Per primary team.  Breathing improved with thoracentesis.  Poor air movement in L lung field.  Given his new stroke, palliative care is being called.   Time spent: 35 minutes-Greater than 50% of this time was spent in counseling, explanation of diagnosis, planning of further management, and coordination of care.  For questions or updates, please contact [place] Please consult [email or web address] for contact info under        Signed, Skeet Latch, MD  10/10/2020, 9:52 AM

## 2020-10-10 NOTE — Progress Notes (Addendum)
OT Cancellation Note  Patient Details Name: Brett Medina MRN: [id number] DOB: Nov 24, 1960   Cancelled Treatment:    Reason Eval/Treat Not Completed: Medical issues which prohibited therapy. Per RN pt with HR in 170s and requests OT check back later today. OT will follow up next available time as appropriate  [name] 10/10/2020, 10:49 AM

## 2020-10-10 NOTE — Consult Note (Signed)
Consultation Note Date: 10/10/2020   Patient Name: Brett Medina  DOB: Mar 23, 1961  MRN: [id number]  Age / Sex: 59 y.o., male  PCP: Patient, No Pcp Per Referring Physician: Alma Friendly, MD  Reason for Consultation: Establishing goals of care  HPI/Patient Profile: 59 y.o. male  with no significant past medical history other than tobacco abuse x 45 years. Patient has not regularly follow with a provider for over 30 years. He was brought to the ED from home for increased shortness of breath. CT on 09/29/20 showed large left pleural effusion and suspected metastatic malignancy and lung nodules. He has undergone a thoracentesis on 11/29 and 12/1 with cytology. Brochoscopy was performed 10/08/20 - biopsies were taken that have resulted in non-small cell carcinoma, staged IV. During hospitalization, patient has also been newly diagnosed with CHF (EF 40-45%) and cardiology has been following. The patient developed acute encephalopathy and neurology was consulted - patient was found to have numerous acute and subacute embolic infarcts with associated petechial hemorrhage per MRI on 10/09/20.  Patient and family face treatment option decisions, advanced directive decisions, and anticipatory care needs.  Clinical Assessment and Goals of Care: I have reviewed medical records including EPIC notes, labs, and imaging. Received report from primary RN - no acute concerns. RN reports patient has had a rapid decline in the last several days and his PO intake is minimal.    Went to visit patient at bedside - wife/[name], Chris/daughter, Angie/daughter, and Wayne/son-in-law was present. Patient was lying in bed awake, alert, disoriented (to time and place), and able to participate in conversation. Patient did intermittently fall asleep and wake up during meeting today. No signs or non-verbal gestures of pain or discomfort noted. No  respiratory distress, increased work of breathing, or secretions noted. Patient denies having pain but does endorse shortness of breath. He is currently on acute 2L O2 [place].   Met with patient and family to discuss diagnosis, prognosis, GOC, EOL wishes, disposition, and options.  I introduced Palliative Medicine as specialized medical care for people living with serious illness. It focuses on providing relief from the symptoms and stress of a serious illness. The goal is to improve quality of life for both the patient and the family.  We discussed a brief life review of the patient as well as functional and nutritional status. Brett Medina and Brett Medina have been together for 37 years (married 24 years). They have two daughters. Prior to hospitalization, the patient was living in a private residence with his wife. He was functionally independent and could ambulate without assistance. Brett Medina tells me that she first noticed a decline about 2 months ago - the patient started needing assistance bathing due to shortness of breath on exertion. Over the last two months the patient has been seen at urgent care twice before ultimately he was brought to the ED. Over the last two months the patient's symptoms have progressed from dyspnea on exertion to dyspnea at rest. The patient was eating and drinking well until last Thursday,  this is when the family noticed a decrease in his PO intake and decreased interest in eating or drinking. The family describes Brett Medina as very strong willed and express that he hasn't been to a provider in over 30 years until recently.   We discussed patient's current illness and what it means in the larger context of patient's on-going co-morbidities. Therapeutic listening was provided as family expressed frustration trying to navigate the many different physicians/specialists they have seen - they feel like they have gotten different information and are "missing pieces of the puzzle." I reviewed the  patient's current medical situation thus far and answered questions - the family expressed understanding and appreciation. The family has a clear understanding that the patient has multiple organ systems involved in his acutely ill state. Education was provided that the patient's newly diagnosed CHF is a progressive, non-curable disease underlying the patient's current acute medical conditions. Natural disease trajectory and expectations at EOL were discussed. I attempted to elicit values and goals of care important to the patient. The difference between aggressive medical intervention and comfort care was considered in light of the patient's goals of care. The patient's family state their main goal is to keep the patient from experiencing lots of pain or suffering. They are open to a comfort path depending on information from oncology and/or clinical course. Oncology is scheduled to meet with the family this evening - they would like to speak with them before making any final decisions, which is reasonable. We did discuss that even if oncology has treatment recommendations, the patient may not be stable enough/phsycially strong enough to undergo treatments if he does not show improvement from his current condition. Discussed that cancer treatments can cause many side effects and are physically hard and, depending on the patient's clinical course, it would be important to keep that in mind as decisions are made - recommended they continue to look at the patient holistically.   Hospice and Palliative Care services outpatient were briefly explained as not to overwhelm family with information; but discussed so the family and patient are aware they have options that will support them as they make decisions to pursue aggressive interventions vs a comfort path.   Advance directives, concepts specific to code status, and artificial feeding and hydration were considered and discussed. The patient does not have a Living  Will or HCPOA. Discussed with family, that if patient continues to have minimal PO intake, decisions around a feeding tube may be necessary during this hospitalization. Briefly discussed and recommended against feeding tube in context of the patient's medical situation. The family did not have any initial thoughts on this topic - explained that no decision had to be made today; we would revisit topic if needed. Family expressed appreciation for feeding tube discussion so they would not be surprised with having to make this decision later if patient's intake does not improve. Encouraged patient/family to consider DNR/DNI status understanding evidenced based poor outcomes in similar hospitalized patient, as the cause of arrest is likely associated with advanced chronic/terminal illness rather than an easily reversible acute cardio-pulmonary event. Patient decided for himself he wanted to change code status to DNR/DNI with understanding that he would not receive CPR, defibrillation, ACLS medications, or intubation - family was supportive of patient's decision.  Visit also consisted of discussions dealing with the complex and emotionally intense issues of symptom management and palliative care in the setting of serious and potentially life-threatening illness. Palliative care team will continue to support  patient, patient's family, and medical team.  Discussed with patient/family the importance of continued conversation with each other and the medical providers regarding overall plan of care and treatment options, ensuring decisions are within the context of the patient's values and GOCs.    Questions and concerns were addressed. The patient/family was encouraged to call with questions and/or concerns. PMT card was provided.  "Hard Choices for Loving People" book was provided.    Primary Decision Maker PATIENT and/or his wife/[name] Schellenberg    SUMMARY OF RECOMMENDATIONS:  Continue current medical treatment  with watchful waiting  Initiated DNR/DNI - durable DNR form completed and placed in shadow chart; copy made and will be scanned into Vyn[employer].  Family want to speak with oncology this evening to gain insight into their recommendations/thoughts - they will make step-wise decisions based on that information  Family are open to comfort care if patient continues to medically decline as they do not want the patient to suffer  Chaplain consult placed for family and patient's request for emotional support  Ongoing [place] discussions pending clinical course/oncology discussion - scheduled another family meeting for tomorrow 12/10 at 1pm   PMT will continue to follow holistically   Code Status/Advance Care Planning:  DNR   Palliative Prophylaxis:   Aspiration, Bowel Regimen, Delirium Protocol, Frequent Pain Assessment, Oral Care and Turn Reposition  Additional Recommendations (Limitations, Scope, Preferences):  Full Scope Treatment  Psycho-social/Spiritual:   Desire for further Chaplaincy support:yes Created space and opportunity for patient and family to express thoughts and feelings regarding patient's current medical situation.   Emotional support and therapeutic listening provided.  Prognosis:   Unable to determine  Discharge Planning: To Be Determined      Primary Diagnoses: Present on Admission: . Large pleural effusion . Elevated troponin . Mass of left lung   I have reviewed the medical record, interviewed the patient and family, and examined the patient. The following aspects are pertinent.  History reviewed. No pertinent past medical history. Social History   Socioeconomic History  . Marital status: Married    Spouse name: Not on file  . Number of children: Not on file  . Years of education: Not on file  . Highest education level: Not on file  Occupational History  . Not on file  Tobacco Use  . Smoking status: Former Smoker    Types: Cigarettes    Quit  date: 07/03/2020    Years since quitting: 0.2  . Smokeless tobacco: Never Used  Vaping Use  . Vaping Use: Never used  Substance and Sexual Activity  . Alcohol use: Yes  . Drug use: Not Currently    Types: Marijuana  . Sexual activity: Not on file  Other Topics Concern  . Not on file  Social History Narrative  . Not on file   Social Determinants of Health   Financial Resource Strain: Not on file  Food Insecurity: Not on file  Transportation Needs: Not on file  Physical Activity: Not on file  Stress: Not on file  Social Connections: Not on file   History reviewed. No pertinent family history. Scheduled Meds: . aspirin EC  81 mg Oral Daily  . atorvastatin  40 mg Oral Daily  . digoxin  0.125 mg Intravenous Q6H  . enoxaparin (LOVENOX) injection  40 mg Subcutaneous Q24H  . mouth rinse  15 mL Mouth Rinse BID  . metoprolol tartrate  25 mg Oral BID  . mupirocin ointment  1 application Nasal BID  . sodium  chloride flush  3 mL Intravenous Q12H  . sodium chloride flush  3 mL Intravenous Q12H   Continuous Infusions: . sodium chloride     PRN Meds:.sodium chloride, acetaminophen **OR** acetaminophen, guaiFENesin-codeine, levalbuterol, loperamide, metoprolol tartrate, ondansetron **OR** ondansetron (ZOFRAN) IV, polyethylene glycol, sodium chloride flush Medications Prior to Admission:  Prior to Admission medications   Medication Sig Start Date End Date Taking? Authorizing Provider  albuterol (VENTOLIN HFA) 108 (90 Base) MCG/ACT inhaler Inhale 2 puffs into the lungs every 6 (six) hours as needed. 09/14/20 09/14/21 Yes [provider]  benzonatate (TESSALON) 100 MG capsule Take 1 capsule by mouth every 6 (six) hours as needed for cough. 09/21/20 09/21/21 Yes [provider]  doxycycline (VIBRA-TABS) 100 MG tablet Take 100 mg by mouth 2 (two) times daily. 09/21/20  Yes [provider]   Allergies  Allergen Reactions  . Contrast Media [Iodinated [employer]]     Flushing, warmth, even with pre-medication   Review of Systems  Constitutional: Positive for activity change, appetite change and fatigue. Negative for chills.  Respiratory: Positive for cough and shortness of breath.   Gastrointestinal: Negative for diarrhea and nausea.  Genitourinary: Negative for dysuria and flank pain.  Neurological: Negative for speech difficulty and light-headedness.  All other systems reviewed and are negative.   Physical Exam Vitals and nursing note reviewed.  Constitutional:      General: He is not in acute distress.    Appearance: He is ill-appearing.  Pulmonary:     Effort: No respiratory distress.  Skin:    General: Skin is warm and dry.  Neurological:     Mental Status: He is lethargic and disoriented.     Motor: Weakness present.     Comments: Disoriented to place and time  Psychiatric:        Attention and Perception: Attention normal.        Behavior: Behavior is cooperative.        Cognition and Memory: Cognition normal. Memory is impaired.     Vital Signs: BP (!) 122/92 (BP Location: Left Arm)   Pulse (!) 35   Temp 99.3 F (37.4 C) (Oral)   Resp (!) 21   Ht 5' 9" (1.753 m)   Wt 86.7 kg   SpO2 96%   BMI 28.23 kg/m  Pain Scale: 0-10 POSS *See Group Information*: 1-Acceptable,Awake and alert Pain Score: 0-No pain   SpO2: SpO2: 96 % O2 Device:SpO2: 96 % O2 Flow Rate: .O2 Flow Rate (L/min): 2 L/min  IO: Intake/output summary:   Intake/Output Summary (Last 24 hours) at 10/10/2020 1213 Last data filed at 10/09/2020 1431 Gross per 24 hour  Intake 120 ml  Output 400 ml  Net -280 ml    LBM: Last BM Date: 10/06/20 Baseline Weight: Weight: 99.8 kg Most recent weight: Weight: 86.7 kg     Palliative Assessment/Data: PPS 30-40%     Time In: 1400 Time Out: 1512 Time Total: 72 minutes  Greater than 50%  of this time was spent counseling and coordinating care related to the above assessment and plan.  Signed  by: Lin Landsman, NP   Please contact Palliative Medicine Team phone at [phone] for questions and concerns.  For individual provider: See Shea Evans

## 2020-10-10 NOTE — Progress Notes (Addendum)
STROKE TEAM PROGRESS NOTE   INTERVAL HISTORY His daughter who is a Therapist, sports is at the bedside. She tells [place] the pt has been a heavy cigarette smoker entire life and only quit ~3mo ago as he was becoming confused and ill. She is realistic about the pts situation and requests Palliative Care consult. MRI w/gad added for better evaluation for brain mets, although Afib seen on monitor is concerning for embolic strokes, which remain in ddx.   Vitals:   10/10/20 1111 10/10/20 1121 10/10/20 1135 10/10/20 1449  BP: 112/80 (!) 86/68 (!) 122/92 (!) 141/123  Pulse: 86 79 (!) 35 (!) 140  Resp: 18 14 (!) 21 (!) 21  Temp:   99.3 F (37.4 C)   TempSrc:   Oral   SpO2: 95% 95% 96% 96%  Weight:      Height:       CBC:  Recent Labs  Lab 10/09/20 0208 10/10/20 0133  WBC 12.9* 12.7*  NEUTROABS 10.9* 10.2*  HGB 11.1* 11.0*  HCT 36.2* 34.0*  MCV 79.4* 78.7*  PLT 183 841   Basic Metabolic Panel:  Recent Labs  Lab 10/09/20 0208 10/10/20 0133  NA 136 137  K 4.4 4.5  CL 101 102  CO2 22 27  GLUCOSE 103* 110*  BUN 21* 24*  CREATININE 1.33* 1.52*  CALCIUM 8.5* 8.6*  MG 2.1 1.9  PHOS 3.8 3.3   Lipid Panel:  Recent Labs  Lab 10/10/20 1017  CHOL 130  TRIG 109  HDL 35*  CHOLHDL 3.7  VLDL 22  LDLCALC 73   HgbA1c: 5.7 on 09/30/20 Urine Drug Screen: No results for input(s): LABOPIA, COCAINSCRNUR, LABBENZ, AMPHETMU, THCU, LABBARB in the last 168 hours.  Alcohol Level No results for input(s): ETH in the last 168 hours.  IMAGING past 24 hours MR BRAIN WO CONTRAST  Addendum Date: 10/09/2020   ADDENDUM REPORT: 10/09/2020 16:31 ADDENDUM: Study discussed by telephone with Dr. Lesia Sago on 10/09/2020 at 1620 hours. Electronically Signed   By: Genevie Ann M.D.   On: 10/09/2020 16:31   Result Date: 10/09/2020 CLINICAL DATA:  59 year old male with altered mental status, confusion. Metastatic disease unknown primary, left lung biopsy results pending. EXAM: MRI HEAD WITHOUT CONTRAST TECHNIQUE:  Multiplanar, multiecho pulse sequences of the brain and surrounding structures were obtained without intravenous contrast. COMPARISON:  Head CT 10/02/2020. CT Chest, Abdomen, and Pelvis 10/02/2020. FINDINGS: Brain: Widespread abnormal restricted diffusion throughout the brain. Lesions range from punctate to patchy, confluent areas. Superior frontal and parietal lobes among those most severely affected (series 5, image 93). Largest diffusion restricted lesion is in the left occipital pole, 3 cm (series 5, image 79). Bilateral cerebellar involvement greater on the left (2.4 cm). Left basal ganglia affected. Other deep gray nuclei and brainstem appear spared. On T2 and FLAIR imaging these areas appear is gyriform and subcortical hyperintense signal in keeping with cytotoxic edema. Some areas demonstrate central cystic change and appear more subacute (series 5, image 90). Occasional rounded areas of signal abnormality are noted (left cerebellum series 11, image 5). SWI demonstrates microhemorrhage at many of the affected sites. And also chronic superimposed microhemorrhage such as at the left dorsal thalamus on series 14 image 23. No malignant hemorrhagic transformation. No extra-axial or intraventricular blood. No ventriculomegaly. No intracranial mass effect or midline shift. Normal basilar cisterns. Cervicomedullary junction and pituitary are within normal limits. Vascular: Major intracranial vascular flow voids are preserved. Generalized intracranial artery tortuosity. Skull and upper cervical spine: Visualized bone marrow signal  is within normal limits. Partially visible cervical spine degeneration. Sinuses/Orbits: Negative. Other: Mastoids are clear. Grossly normal visible internal auditory structures. Visible scalp and face soft tissues appear negative. IMPRESSION: 1. 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 Many of the areas  demonstrate associated petechial hemorrhage - raising the possibility of septic emboli which are prone to bleeding - but there is no malignant hemorrhagic transformation or significant intracranial mass effect. 2. No strong evidence of metastatic disease to the brain on this non-contrast exam. Note that some of the above infarcts will likely enhance on subsequent MRI without and with contrast, which will confound staging for metastases to a degree. Electronically Signed: By: Genevie Ann M.D. On: 10/09/2020 16:10   DG CHEST PORT 1 VIEW  Result Date: 10/10/2020 CLINICAL DATA:  Tachycardia, shortness of breath EXAM: PORTABLE CHEST 1 VIEW COMPARISON:  10/08/2020 FINDINGS: Continued complete opacification of the left hemithorax, unchanged. Right basilar patchy airspace opacity again noted, unchanged. No visible effusion on the right. No acute bony abnormality. IMPRESSION: No significant change since prior study. Electronically Signed   By: Rolm Baptise M.D.   On: 10/10/2020 12:27   VAS US CAROTID  Result Date: 10/10/2020 Carotid Arterial Duplex Study Indications:       CVA. Limitations        Today's exam was limited due to the patient's respiratory                    variation. Comparison Study:  no prior Performing Technologist: Abram Sander RVS  Examination Guidelines: A complete evaluation includes B-mode imaging, spectral Doppler, color Doppler, and power Doppler as needed of all accessible portions of each vessel. Bilateral testing is considered an integral part of a complete examination. Limited examinations for reoccurring indications may be performed as noted.  Right Carotid Findings: +----------+--------+--------+--------+------------------+--------+           PSV cm/sEDV cm/sStenosisPlaque DescriptionComments +----------+--------+--------+--------+------------------+--------+ CCA Prox  66      7               heterogenous                +----------+--------+--------+--------+------------------+--------+ CCA Distal73      14              heterogenous               +----------+--------+--------+--------+------------------+--------+ ICA Prox  36      9       1-39%   heterogenous               +----------+--------+--------+--------+------------------+--------+ ICA Distal37      17                                         +----------+--------+--------+--------+------------------+--------+ ECA       87      9                                          +----------+--------+--------+--------+------------------+--------+ +----------+--------+-------+--------+-------------------+           PSV cm/sEDV cmsDescribeArm Pressure (mmHG) +----------+--------+-------+--------+-------------------+ [id number]                                         +----------+--------+-------+--------+-------------------+ +---------+--------+--+--------+--+---------+  VertebralPSV cm/s73EDV cm/s19Antegrade +---------+--------+--+--------+--+---------+  Left Carotid Findings: +----------+--------+--------+--------+------------------+--------+           PSV cm/sEDV cm/sStenosisPlaque DescriptionComments +----------+--------+--------+--------+------------------+--------+ CCA Prox  47      11              heterogenous               +----------+--------+--------+--------+------------------+--------+ CCA Distal69      14              heterogenous               +----------+--------+--------+--------+------------------+--------+ ICA Prox  45      14      1-39%   heterogenous               +----------+--------+--------+--------+------------------+--------+ ICA Distal76      26                                         +----------+--------+--------+--------+------------------+--------+ ECA       104                                                 +----------+--------+--------+--------+------------------+--------+ +----------+--------+--------+--------+-------------------+           PSV cm/sEDV cm/sDescribeArm Pressure (mmHG) +----------+--------+--------+--------+-------------------+ Subclavian70                                          +----------+--------+--------+--------+-------------------+ +---------+--------+--+--------+--+---------+ VertebralPSV cm/s37EDV cm/s11Antegrade +---------+--------+--+--------+--+---------+   Summary: Right Carotid: Velocities in the right ICA are consistent with a 1-39% stenosis. Left Carotid: Velocities in the left ICA are consistent with a 1-39% stenosis. Vertebrals: Bilateral vertebral arteries demonstrate antegrade flow. *See table(s) above for measurements and observations.  Electronically signed by Antony Contras MD on 10/10/2020 at 12:17:14 PM.    Final    VAS [place] TRANSCRANIAL DOPPLER  Result Date: 10/10/2020  Transcranial Doppler with Bubble Indications: Stroke. Performing Technologist: Abram Sander RVS  Examination Guidelines: A complete evaluation includes B-mode imaging, spectral Doppler, color Doppler, and power Doppler as needed of all accessible portions of each vessel. Bilateral testing is considered an integral part of a complete examination. Limited examinations for reoccurring indications may be performed as noted.  +----------+-------------+----------+-----------+-------+ RIGHT TCD Right VM (cm)Depth (cm)PulsatilityComment +----------+-------------+----------+-----------+-------+ MCA           21.00                 1.28            +----------+-------------+----------+-----------+-------+ Term ICA      33.00                 1.10            +----------+-------------+----------+-----------+-------+ ICA siphon    27.00                 1.11            +----------+-------------+----------+-----------+-------+  +----------+------------+----------+-----------+-------+  LEFT TCD  Left VM (cm)Depth (cm)PulsatilityComment +----------+------------+----------+-----------+-------+ MCA          25.00                 1.36            +----------+------------+----------+-----------+-------+  Term ICA     14.00                 1.27            +----------+------------+----------+-----------+-------+ PCA          14.00                 0.89            +----------+------------+----------+-----------+-------+ Opthalmic    24.00                 1.52            +----------+------------+----------+-----------+-------+ ICA siphon   28.00                 1.05            +----------+------------+----------+-----------+-------+  Summary:  Poor bitemporal and absent suboccipital window limit exam.Low normal mean flow velocities in majority of identified vessels of anterior circulation of unclear significance *See table(s) above for TCD measurements and observations.  Diagnosing physician: Antony Contras MD Electronically signed by Antony Contras MD on 10/10/2020 at 12:18:38 PM.    Final     PHYSICAL EXAM General: elderly man in moderate distress and chronically ill appearing. Psych: Affect appropriate to situation Eyes: No scleral injection HENT: No OP obstrucion Head: Normocephalic.  Cardiovascular: Afib on bedside monitor, PVCs noted. 12 lead also reviewed Respiratory: SOB, much effort normal, course sounds GI: Soft.  No distension. There is no tenderness.  Skin: WDI, pale    Neurological Examination Mental Status: Lethargic, poorly attends, but with much effort will follow brief command, unable to attend long enough for complex commands. Able to state name, cannot name his dtr, nor month or further questions. Speech is sparse, dysarthric unable to name objects. Cranial Nerves: II: Visual fields grossly normal III,IV, VI: ptosis not present, extra-ocular motions intact bilaterally, pupils equal, round, reactive to light and accommodation V,VII: smile  symmetric, facial light touch sensation normal bilaterally VIII: hearing normal bilaterally IX,X: uvula rises symmetrically XI: bilateral shoulder shrug XII: midline tongue extension Motor: Diffusely weak; difficult exam d/t lack of cooperation. Did not seem to have lateralizing weakness.Tone and bulk:normal tone throughout; no atrophy noted Sensory: seems to have light touch intact throughout, bilaterally Deep Tendon Reflexes: 2+ and symmetric throughout Plantars: Right: downgoing   Left: downgoing Cerebellar: Unable to participate Gait: unable to test  ASSESSMENT/PLAN Brett Medina is a 59 y.o. male who did not see doctors, thus no previous history. He presents with progressive confusion over last 3 months and increased respiratory symptoms. Found to have lung mass and metastatic disease. Brain MRI w/o was done d/t ongoing AMS, incidentally scattered widespread patches of bilateral infarcts seen, some with hemorrhagic conversion, raising question of further ddx of septic emboli vs mets. Additionally, given new afib, possible malignancy induced hypercoagulable state, he is at high risk for ischemic stroke. Further etiology pending once MRI w/gad done to see if there is enhancement.   Strokes: multiple scattered strokes. Clear etiology pending as outlined above.  CT head: Hypoattenuating foci in the right frontal lobe and insula, could reflect remote sequela of prior ischemia or infarct. Though could be  better assessed on MR imaging as well  MRI: scattered widespread patches of bilateral infarcts seen, some with hemorrhagic conversion  Carotid Doppler: no stenosis  TCD: neg  2D Echo: Global hypokinesis, EF 45%  LDL 73  HgbA1c 5.7  VTE prophylaxis - lovenox  Diet   DIET DYS 2 Room service appropriate? Yes with Assist; Fluid consistency: Thin     none prior to admission, now on ASA 81mg  daily  Therapy recommendations:  Pending GOC  Disposition:  Pending  GOC  Hypertension  Home meds:  none  Stable . No role in permissive hypertension  . Long-term BP goal normotensive  Hyperlipidemia  Home meds: none  LDL 73, goal < 70  None indicated at this time  No Diabetes  HgbA1c 5.7, goal < 7.0  CBGs  No results for input(s): GLUCAP in the last 72 hours.   SSI  Other Stroke Risk Factors  Cigarette smoker, recently quit  Metastatic cancer  Other Active Problems  Metastatic disease- wk up underway  Palliative Care consult  Hospital day # 11  Brett Medina, ARNP-C, ANVP-BC Pager: [phone] I have personally obtained history,examined this patient, reviewed notes, independently viewed imaging studies, participated in medical decision making and plan of care.ROS completed by me personally and pertinent positives fully documented  I have made any additions or clarifications directly to the above note. Agree with note above.  Patient has altered mental status and confusion secondary to by cerebral embolic infarcts possibly from new onset A. fib though brain metastasis or infarct secondary to cancer related hyper coag ability of marantic endocarditis also other possibilities.  Patient's overall general medical condition appears quite poor and I think a palliative care approach would be more appropriate in this situation.  Long discussion with patient daughter at the bedside and with Dr. Horris Latino.  Patient is not a good long-term anticoagulation candidate at the present time due to his pleural effusion and poor general medical status.  Greater than 50% time during this 35-minute visit was spent on counseling and coordination of care about his embolic stroke and discussion with care team and answering questions.  Antony Contras, MD  Antony Contras, MD Medical Director [facility] Pager: [phone] 10/10/2020 4:46 PM  To contact Stroke Continuity provider, please refer to [email or web address] After hours, contact General  Neurology

## 2020-10-10 NOTE — Progress Notes (Signed)
NAME:  Brett Medina, MRN:  [id number], DOB:  September 11, 1961, LOS: 3 ADMISSION DATE:  09/29/2020, CONSULTATION DATE:  10/01/2020 REFERRING MD:  Dr. Nevada Crane , CHIEF COMPLAINT:  Pleural Effusion  Brief History   59 year old male with hx of tobacco abuse presenting with progressive shortness of breath with CT findings concerning for metastatic lung process with large left pleural effusion.    History of present illness    59 year old male with no significant past medical history other than tobacco abuse with a 68 year pack history (reports smoking for 45 years, 1.5 ppd) in which he quit smoking 2 months ago.  He does not see a physician on a regular basis.  Reports chronic bilateral lower leg swelling, as it runs in his family.    Presents with progressive two month history of progressive dyspnea.  Started out in September as dyspnea on exertion which progressed to dsypnea even at rest.  Reports chronic unchanged productive cough- with clear sputum.  Also reports 15lb weight loss over the last month.  He was seen at urgent care several weeks ago and prescribed prednisone, which did not help.  On admit, he was tachycardic, mildly tachypneic, and room air saturations of 91-97%.  Workup notable for WBC 13.2, troponin 1087->1380-> 1446-> 746, BNP 205, SARS2 negative, moderate Hgb on UA.  CXR showed near complete consolidation of left hemithorax with combination of pleural effusion and consolidation of left lung, with peripheral nodular opacity of right mid lung, all concerning for malignancy.  He was cultured and started on empiric antibiotics.  He was admitted to [facility] for further workup.    Cardiology consulted and are following given elevated troponin's and started on heparin gtt.  TTE showing LVEF 40-45% with global hypokinesis.  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  He underwent a left thoracentesis in IR on 11/29 with 950 ml fluid removed and sent for cytology and pleural studies.  Reports some improvement in SOB since thoracentesis.   Repeat CXR today, shows increased size in left pleural effusion.  Patient reports no worsening of SOB today.  Pulmonary consulted for further management and evaluation of pleural effusion and pulmonary nodules.   Past Medical History  Tobacco abuse   Significant Hospital Events   11/28 Admitted [place] 11/29 left thoracentesis/ cards consult  Consults:  IR Cardiology  Pulmonary  Procedures:  11/29 Left thoracentesis IR-> 950 ml of dark brown fluid  Significant Diagnostic Tests:  11/28 CT chest wo contrast >> 1. Constellation of findings consistent with metastatic malignancy. 2. Moderate to large volume left pleural effusion with almost complete collapse of the left upper and lower lobes. Associated mediastinal lymphadenopathy and limited evaluation for hilar lymphadenopathy on this noncontrast study. Underlying malignancy suspected (ddx includes metastasis or primary lung). Consider analysis of the pleural fluid for further evaluation. 3. Indeterminate multifocal right lung pulmonary nodules measuring up to 1 cm in the right middle lobe. 4. Indeterminate 3.2 cm peribronchovascular ground-glass airspace opacity within the right lower lobe. 5. Scattered suspicious sclerotic lesions concerning for osseous metastases. 6. Indeterminate pericentimeter subcutaneus soft tissue densities within the bilateral anterolateral back at the level of scapulas. 7. Severe emphysematous changes. 8. Ascending aorta aneurysm measuring  up to 4.5 cm. 9. Aortic Atherosclerosis (ICD10-I70.0) and Emphysema  TTE  11/29 >> 1. Left ventricular ejection fraction, by estimation, is 40 to 45%. The  left ventricle has mildly decreased function. The left ventricle  demonstrates global hypokinesis. Left ventricular diastolic parameters are  indeterminate.  2. Right ventricular systolic function is normal. The right ventricular  size is normal. Tricuspid regurgitation signal is inadequate for assessing  PA pressure.  3. The mitral valve is normal in structure. No evidence of mitral valve  regurgitation. No evidence of mitral stenosis.  4. The aortic valve was not well visualized. Aortic valve regurgitation  is trivial. No aortic stenosis is present.  5. Aortic dilatation noted. 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  6. The inferior vena cava is dilated in size with <50% respiratory  variability, suggesting right atrial pressure of 15 mmHg.    Micro Data:  11/28 SARS 2/ flu >>neg 11/28 BCx2 >> ngtd 11/28 UC >> 11/29 left pleural studies: GS >> NGTD          AFB >> NGTD                     Culture >> NGTD                                 Cytology >> atypical cells consistent with malignancy but not fully diagnostic.  Immunohistochemical staining positive cytokeratin AE1/3, cytokeratin 7, negative TTF-1          Antimicrobials:  11/28 azithro >> completed 11/28 ceftriaxone >> completed  Interim history/subjective:   Underwent bronchoscopy 12/7, tolerated Path available as below, consistent with adenocarcinoma   Objective   Blood pressure (!) 122/92, pulse (!) 35, temperature 99.3 F (37.4 C), temperature source Oral, resp. rate (!) 21, height 5\' 9"  (1.753 m), weight 86.7 kg, SpO2 96 %.        Intake/Output Summary (Last 24 hours) at 10/10/2020 1349 Last data filed at 10/09/2020 1431 Gross per 24 hour  Intake 120 ml  Output --  Net 120 ml   Filed Weights   10/08/20 0523 10/09/20 0357 10/10/20 0324  Weight: 89.3 kg 87.5 kg 86.7 kg   Physical  Exam: General: Sitting up at bedside, no distress HENT: Oropharynx clear, no lesions Eyes: Pupils equal Respiratory: Decreased on the left, scattered rhonchi, mostly clear on the right Cardiovascular: Regular, distant, no murmur GI: Nondistended, positive bowel sounds Extremities: 1+ lower extremity edema  Resolved Hospital Problem list    Assessment & Plan:    Acute hypoxic respiratory failure secondary to lung mass with atelectasis, pleural effusion Emphysematous changes on CT Left lung mass -Pathology consistent with adenocarcinoma, stage IV disease based on pleural fluid findings, liver lesions.  Question whether there may be brain involvement, MRI brain equivocal -Agree with oncology evaluation, radiation oncology evaluation to discuss options for possible therapy.  Need to send for molecular studies to see if there is a role for either immunotherapy or targeted therapy.  He is also planning to meet with palliative care clarify his goals, ensure that his wishes regarding treatment strategies are honored.  I discussed this with the patient and his family at bedside today 12/9.     Labs   CBC: Recent Labs  Lab 10/06/20 0206 10/07/20 0234 10/08/20 0121 10/09/20 0208 10/10/20 0133  WBC 11.1* 10.9* 12.9* 12.9* 12.7*  NEUTROABS 9.1* 8.7* 10.7*  10.9* 10.2*  HGB 11.4* 11.5* 11.5* 11.1* 11.0*  HCT 35.7* 37.2* 37.0* 36.2* 34.0*  MCV 78.5* 79.1* 79.1* 79.4* 78.7*  PLT 324 255 244 183 309    Basic Metabolic Panel: Recent Labs  Lab 10/05/20 0115 10/06/20 0206 10/07/20 0234 10/08/20 0121 10/09/20 0208 10/10/20 0133  NA 136 134* 132*  --  136 137  K 5.1 4.1 4.2  --  4.4 4.5  CL 101 101 99  --  101 102  CO2 28 24 24   --  22 27  GLUCOSE 146* 119* 94  --  103* 110*  BUN 21* 21* 18  --  21* 24*  CREATININE 1.39* 1.26* 1.19  --  1.33* 1.52*  CALCIUM 8.9 8.7* 8.5*  --  8.5* 8.6*  MG 2.2 1.8 1.8 1.9 2.1 1.9  PHOS 4.2 4.1 3.5 3.2 3.8 3.3   GFR: Estimated Creatinine  Clearance: 57.8 mL/min (A) (by C-G formula based on SCr of 1.52 mg/dL (H)). Recent Labs  Lab 10/07/20 0234 10/08/20 0121 10/09/20 0208 10/10/20 0133  WBC 10.9* 12.9* 12.9* 12.7*    Liver Function Tests: Recent Labs  Lab 10/05/20 0115 10/06/20 0206 10/07/20 0234 10/09/20 0208 10/10/20 0133  AST 46* 53* 40 44* 57*  ALT 38 55* 49* 41 44  ALKPHOS 149* 175* 224* 177* 175*  BILITOT 0.4 0.6 0.8 1.3* 0.7  PROT 5.1* 5.5* 5.1* 5.4* 5.1*  ALBUMIN 1.9* 2.1* 2.0* 2.0* 2.0*   No results for input(s): LIPASE, AMYLASE in the last 168 hours. No results for input(s): AMMONIA in the last 168 hours.  ABG No results found for: PHART, PCO2ART, PO2ART, HCO3, TCO2, ACIDBASEDEF, O2SAT   Coagulation Profile: No results for input(s): INR, PROTIME in the last 168 hours.  Cardiac Enzymes: No results for input(s): CKTOTAL, CKMB, CKMBINDEX, TROPONINI in the last 168 hours.  HbA1C: Hgb A1c MFr Bld  Date/Time Value Ref Range Status  09/30/2020 02:13 AM 5.7 (H) 4.8 - 5.6 % Final    Comment:    (NOTE) Pre diabetes:          5.7%-6.4%  Diabetes:              >6.4%  Glycemic control for   <7.0% adults with diabetes     CBG: No results for input(s): GLUCAP in the last 168 hours.  Baltazar Apo, MD, PhD 10/10/2020, 1:49 PM [facility] [phone] or if no answer [phone]

## 2020-10-10 NOTE — Progress Notes (Signed)
PROGRESS NOTE    Brett Medina  TKP:[id number] DOB: [birth year] DOA: 09/29/2020 PCP: Patient, No Pcp Per     Brief Narrative:  Brett Medina a 59 y.o.WM PMHx Tobacco abuse.  Presented to the ED with complaints of difficulty breathing ongoing over the past 69months at least. Reports he was seen at urgent care several was given prednisone. Patient quit smoking cigarettes about 2 months ago. Has chronic unchanged bilateral lower extremity swelling. Reports a productive cough over the past few weeks. No family history of heart attacks. In the ED, O2 sats 91 to 97% on room air. Tachycardic to 135, mild tachypnea to 22.Lactic acid 1.3. WBC 13.2. Troponin 1087. UA with moderate hemoglobin. Chest CT-Constellation of findings consistent with metastatic malignancy, moderate to large volume left pleural effusion with almost complete collapse of left upper and lower lobes. Patient reports contrast allergy, he refused CT with contrast to rule out PE as he wastachycardic. Cardiology was consulted for elevated troponin, following. S/P left thoracentesis on 09/30/20 by IR 950 cc of dark bloody fluid removed. 2D echo 11/29 showing reduced LVEF 40-45% with global left ventricle hypokinesis.  B/L LE duplex US negative for DVT.  On 1130/21, chest x-ray showed recurrent large left pleural effusion, IR consulted for repeat left thoracentesis.  PCCM on board, recommend bronchoscopy, biopsies taken, await results.    Today, patient noted to be more confused, unable to have a meaningful conversation, noted to go into atrial flutter with RVR. Denies any palpitations, chest pain, noted to be mildly shortness of breath, coughing frequently. Overall poor prognosis. Discussed extensively with patient, and 2 daughters at bedside about overall poor prognosis.   Assessment & Plan:   Principal Problem:   Mass of left lung Active Problems:   Large pleural effusion   Elevated troponin   Demand ischemia (HCC)    Acute systolic heart failure (HCC)   Cerebral embolism with cerebral infarction   Acute respiratory failure with hypoxia Large left pleural effusion Non-small cell lung CA Multifactorial, likely 2/2 left pleural effusion, s/p left thoracentesis draining about 950 cc of fluid, exudative in nature PCCM on board, s/p EBUS/bronchoscopy Continue Xopenex Supplemental O2, I-S, flutter valve  Non-small cell lung CA Biopsy report showed above Oncology consulted, appreciate recs  Acute metabolic encephalopathy Numerous acute and subacute embolic infarcts Vs ? 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% on 2D echo 11/29 LHC revealed moderate diffuse LAD stenosis and 40% D2 lesion, medical management LDL 73 Cardiology on board Continue aspirin, Lipitor, metoprolol, hold losartan due to low BP Daily weights, start strict I&O Monitor closely  Leukocytosis, likely reactive Afebrile Currently not on antibiotics Continue to monitor fever curve and WBC  AKI Ongoing Daily BMP  ?UTI Enterococcus faecalis UC grew 1000 colony Completed antibiotics (12/4-->12/7)   Goals of care discussion Patient with very poor prognosis Extensive discussion with daughters on 10/10/2020 Palliative care consulted, switched patient to DNR       DVT prophylaxis: Lovenox Code Status: Full Family Communication: Discussed extensively with daughters at bedside on 10/10/2020  Status is: Inpatient    Dispo: The patient is from: Home  Anticipated d/c is to: Home               Anticipated d/c date is: TBD              Patient currently unstable      Consultants:  PCCM Cardiology IR    Procedures/Significant Events:  11/30  LEFT thoracentesis aspirated 950 cc fluid 11/30 PCXR;Increased size of large left pleural effusion with decreased left upper lung aeration. -Right perihilar/basilar opacities are grossly unchanged 12/1 LEFT thoracentesis; aspirated 663ml maroon-colored fluid 12/3 LEFT heart cath;1) Pt with nonobstructive CAD  Moderate diffuse mid-LAD stenosis  Moderate diffuse diagonal stenosis  Patent LCx without stenosis  Patent RCA with mild aneurysmal dilatation of the mid-vessel and aneurysmal dilatation of the PLA branch 2) Normal LVEDP    Cultures 11/28 urine positive Enterococcus faecalis 11/29 LEFT thoracentesis NGTD    Antimicrobials: Anti-infectives (From admission, onward)   Start     Ordered Stop   10/05/20 1615  amoxicillin (AMOXIL) capsule 500 mg        10/05/20 1528     10/05/20 1445  cefTRIAXone (ROCEPHIN) 2 g in sodium chloride 0.9 % 100 mL IVPB  Status:  Discontinued        10/05/20 1354 10/05/20 1527   09/29/20 1630  cefTRIAXone (ROCEPHIN) 2 g in sodium chloride 0.9 % 100 mL IVPB  Status:  Discontinued        09/29/20 1624 09/29/20 2226   09/29/20 1630  azithromycin (ZITHROMAX) 500 mg in sodium chloride 0.9 % 250 mL IVPB  Status:  Discontinued        09/29/20 1624 09/29/20 2226         Continuous Infusions: . sodium chloride       Objective: Vitals:   10/10/20 1121 10/10/20 1135 10/10/20 1449 10/10/20 1552  BP: (!) 86/68 (!) 122/92 (!) 141/123 (!) 108/93  Pulse: 79 (!) 35 (!) 140 (!) 103  Resp: 14 (!) 21 (!) 21 18  Temp:  99.3 F (37.4 C)  99 F (37.2 C)  TempSrc:  Oral  Oral  SpO2: 95% 96% 96% 98%  Weight:      Height:        Intake/Output Summary (Last 24 hours) at 10/10/2020 1734 Last data filed at 10/10/2020 1503 Gross per 24 hour  Intake 250.91 ml  Output 300 ml  Net -49.09 ml    Filed Weights   10/08/20 0523 10/09/20 0357 10/10/20 0324  Weight: 89.3 kg 87.5 kg 86.7 kg   Physical Exam:  General: NAD, confused  Cardiovascular: S1, S2 present  Respiratory:  Diminished breath sounds bilaterally  Abdomen: Soft, nontender, nondistended, bowel sounds present  Musculoskeletal: 1+ bilateral pedal edema noted  Skin: Normal  Psychiatry: Unable to assess   CBC: Recent Labs  Lab 10/06/20 0206 10/07/20 0234 10/08/20 0121 10/09/20 0208 10/10/20 0133  WBC 11.1* 10.9* 12.9* 12.9* 12.7*  NEUTROABS 9.1* 8.7* 10.7* 10.9* 10.2*  HGB 11.4* 11.5* 11.5* 11.1* 11.0*  HCT 35.7* 37.2* 37.0* 36.2* 34.0*  MCV 78.5* 79.1* 79.1* 79.4* 78.7*  PLT 324 255 244 183 557   Basic Metabolic Panel: Recent Labs  Lab 10/05/20 0115 10/06/20 0206 10/07/20 0234 10/08/20 0121 10/09/20 0208 10/10/20 0133  NA 136 134* 132*  --  136 137  K 5.1 4.1 4.2  --  4.4 4.5  CL 101 101 99  --  101 102  CO2 28 24 24   --  22 27  GLUCOSE 146* 119* 94  --  103* 110*  BUN 21* 21* 18  --  21* 24*  CREATININE 1.39* 1.26* 1.19  --  1.33* 1.52*  CALCIUM 8.9 8.7* 8.5*  --  8.5* 8.6*  MG 2.2 1.8 1.8 1.9 2.1 1.9  PHOS 4.2 4.1 3.5 3.2 3.8 3.3   GFR: Estimated Creatinine Clearance: 57.8 mL/min (A) (by C-G formula based on SCr of 1.52 mg/dL (H)). Liver Function Tests: Recent Labs  Lab 10/05/20 0115 10/06/20 0206 10/07/20 0234 10/09/20 0208 10/10/20 0133  AST 46* 53* 40 44* 57*  ALT 38 55* 49* 41 44  ALKPHOS 149* 175* 224* 177* 175*  BILITOT 0.4 0.6 0.8 1.3* 0.7  PROT 5.1* 5.5* 5.1* 5.4* 5.1*  ALBUMIN 1.9* 2.1* 2.0* 2.0* 2.0*   No results for input(s): LIPASE, AMYLASE in the last 168 hours. No results for input(s): AMMONIA in the last 168 hours. Coagulation Profile: No results for input(s): INR, PROTIME in the last 168 hours. Cardiac Enzymes: No results for input(s): CKTOTAL, CKMB, CKMBINDEX, TROPONINI in the last 168 hours. BNP (last 3 results) No results for input(s): PROBNP in  the last 8760 hours. HbA1C: No results for input(s): HGBA1C in the last 72 hours. CBG: No results for input(s): GLUCAP in the last 168 hours. Lipid Profile: Recent Labs    10/10/20 1017  CHOL 130  HDL 35*  LDLCALC 73  TRIG 109  CHOLHDL 3.7   Thyroid Function Tests: No results for input(s): TSH, T4TOTAL, FREET4, T3FREE, THYROIDAB in the last 72 hours. Anemia Panel: No results for input(s): VITAMINB12, FOLATE, FERRITIN, TIBC, IRON, RETICCTPCT in the last 72 hours. Sepsis Labs: No results for input(s): PROCALCITON, LATICACIDVEN in the last 168 hours.  Recent Results (from the past 240 hour(s))  Surgical PCR screen     Status: None   Collection Time: 10/08/20  8:51 AM   Specimen: Nasal Mucosa; Nasal Swab  Result Value Ref Range Status   MRSA, PCR NEGATIVE NEGATIVE Final   Staphylococcus aureus NEGATIVE NEGATIVE Final    Comment: (NOTE) The Xpert SA Assay (FDA approved for NASAL specimens in patients 61 years of age and older), is one component of a comprehensive surveillance program. It is not intended to diagnose infection nor to guide or monitor treatment. Performed at [facility], [place] [address] [place], [place] [address]          Radiology Studies: MR BRAIN WO CONTRAST  Addendum Date: 10/09/2020   ADDENDUM REPORT: 10/09/2020 16:31 ADDENDUM: Study discussed by telephone with Dr. Lesia Sago on 10/09/2020 at 1620 hours. Electronically Signed   By: Genevie Ann M.D.   On: 10/09/2020 16:31   Result Date: 10/09/2020 CLINICAL DATA:  59 year old male with altered mental status, confusion. Metastatic disease unknown primary, left lung biopsy results pending. EXAM: MRI HEAD WITHOUT CONTRAST TECHNIQUE: Multiplanar, multiecho pulse sequences of the brain and surrounding structures were obtained without intravenous contrast. COMPARISON:  Head CT 10/02/2020. CT Chest, Abdomen, and Pelvis 10/02/2020. FINDINGS: Brain: Widespread abnormal restricted diffusion throughout the  brain. Lesions range from punctate to patchy, confluent areas. Superior frontal and parietal lobes among those most severely affected (series 5, image 93). Largest diffusion restricted lesion is in the left occipital pole, 3 cm (series 5, image 79). Bilateral cerebellar involvement greater on the left (2.4 cm). Left basal ganglia affected. Other deep gray nuclei and brainstem appear spared. On T2 and FLAIR imaging these areas appear is gyriform and subcortical hyperintense signal in keeping with cytotoxic edema. Some areas demonstrate central cystic change and  appear more subacute (series 5, image 90). Occasional rounded areas of signal abnormality are noted (left cerebellum series 11, image 5). SWI demonstrates microhemorrhage at many of the affected sites. And also chronic superimposed microhemorrhage such as at the left dorsal thalamus on series 14 image 23. No malignant hemorrhagic transformation. No extra-axial or intraventricular blood. No ventriculomegaly. No intracranial mass effect or midline shift. Normal basilar cisterns. Cervicomedullary junction and pituitary are within normal limits. Vascular: Major intracranial vascular flow voids are preserved. Generalized intracranial artery tortuosity. Skull and upper cervical spine: Visualized bone marrow signal is within normal limits. Partially visible cervical spine degeneration. Sinuses/Orbits: Negative. Other: Mastoids are clear. Grossly normal visible internal auditory structures. Visible scalp and face soft tissues appear negative. IMPRESSION: 1. 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 Many of the areas demonstrate associated petechial hemorrhage - raising the possibility of septic emboli which are prone to bleeding - but there is no malignant hemorrhagic transformation or significant intracranial mass effect. 2. No strong evidence of metastatic disease to the brain on this  non-contrast exam. Note that some of the above infarcts will likely enhance on subsequent MRI without and with contrast, which will confound staging for metastases to a degree. Electronically Signed: By: Genevie Ann M.D. On: 10/09/2020 16:10   DG CHEST PORT 1 VIEW  Result Date: 10/10/2020 CLINICAL DATA:  Tachycardia, shortness of breath EXAM: PORTABLE CHEST 1 VIEW COMPARISON:  10/08/2020 FINDINGS: Continued complete opacification of the left hemithorax, unchanged. Right basilar patchy airspace opacity again noted, unchanged. No visible effusion on the right. No acute bony abnormality. IMPRESSION: No significant change since prior study. Electronically Signed   By: Rolm Baptise M.D.   On: 10/10/2020 12:27   VAS US CAROTID  Result Date: 10/10/2020 Carotid Arterial Duplex Study Indications:       CVA. Limitations        Today's exam was limited due to the patient's respiratory                    variation. Comparison Study:  no prior Performing Technologist: Abram Sander RVS  Examination Guidelines: A complete evaluation includes B-mode imaging, spectral Doppler, color Doppler, and power Doppler as needed of all accessible portions of each vessel. Bilateral testing is considered an integral part of a complete examination. Limited examinations for reoccurring indications may be performed as noted.  Right Carotid Findings: +----------+--------+--------+--------+------------------+--------+           PSV cm/sEDV cm/sStenosisPlaque DescriptionComments +----------+--------+--------+--------+------------------+--------+ CCA Prox  66      7               heterogenous               +----------+--------+--------+--------+------------------+--------+ CCA Distal73      14              heterogenous               +----------+--------+--------+--------+------------------+--------+ ICA Prox  36      9       1-39%   heterogenous                +----------+--------+--------+--------+------------------+--------+ ICA Distal37      17                                         +----------+--------+--------+--------+------------------+--------+ ECA  87      9                                          +----------+--------+--------+--------+------------------+--------+ +----------+--------+-------+--------+-------------------+           PSV cm/sEDV cmsDescribeArm Pressure (mmHG) +----------+--------+-------+--------+-------------------+ [id number]                                         +----------+--------+-------+--------+-------------------+ +---------+--------+--+--------+--+---------+ VertebralPSV cm/s73EDV cm/s19Antegrade +---------+--------+--+--------+--+---------+  Left Carotid Findings: +----------+--------+--------+--------+------------------+--------+           PSV cm/sEDV cm/sStenosisPlaque DescriptionComments +----------+--------+--------+--------+------------------+--------+ CCA Prox  47      11              heterogenous               +----------+--------+--------+--------+------------------+--------+ CCA Distal69      14              heterogenous               +----------+--------+--------+--------+------------------+--------+ ICA Prox  45      14      1-39%   heterogenous               +----------+--------+--------+--------+------------------+--------+ ICA Distal76      26                                         +----------+--------+--------+--------+------------------+--------+ ECA       104                                                +----------+--------+--------+--------+------------------+--------+ +----------+--------+--------+--------+-------------------+           PSV cm/sEDV cm/sDescribeArm Pressure (mmHG) +----------+--------+--------+--------+-------------------+ Subclavian70                                           +----------+--------+--------+--------+-------------------+ +---------+--------+--+--------+--+---------+ VertebralPSV cm/s37EDV cm/s11Antegrade +---------+--------+--+--------+--+---------+   Summary: Right Carotid: Velocities in the right ICA are consistent with a 1-39% stenosis. Left Carotid: Velocities in the left ICA are consistent with a 1-39% stenosis. Vertebrals: Bilateral vertebral arteries demonstrate antegrade flow. *See table(s) above for measurements and observations.  Electronically signed by Antony Contras MD on 10/10/2020 at 12:17:14 PM.    Final    VAS [place] TRANSCRANIAL DOPPLER  Result Date: 10/10/2020  Transcranial Doppler with Bubble Indications: Stroke. Performing Technologist: Abram Sander RVS  Examination Guidelines: A complete evaluation includes B-mode imaging, spectral Doppler, color Doppler, and power Doppler as needed of all accessible portions of each vessel. Bilateral testing is considered an integral part of a complete examination. Limited examinations for reoccurring indications may be performed as noted.  +----------+-------------+----------+-----------+-------+ RIGHT TCD Right VM (cm)Depth (cm)PulsatilityComment +----------+-------------+----------+-----------+-------+ MCA           21.00                 1.28            +----------+-------------+----------+-----------+-------+ Term ICA      33.00  1.10            +----------+-------------+----------+-----------+-------+ ICA siphon    27.00                 1.11            +----------+-------------+----------+-----------+-------+  +----------+------------+----------+-----------+-------+ LEFT TCD  Left VM (cm)Depth (cm)PulsatilityComment +----------+------------+----------+-----------+-------+ MCA          25.00                 1.36            +----------+------------+----------+-----------+-------+ Term ICA     14.00                 1.27             +----------+------------+----------+-----------+-------+ PCA          14.00                 0.89            +----------+------------+----------+-----------+-------+ Opthalmic    24.00                 1.52            +----------+------------+----------+-----------+-------+ ICA siphon   28.00                 1.05            +----------+------------+----------+-----------+-------+  Summary:  Poor bitemporal and absent suboccipital window limit exam.Low normal mean flow velocities in majority of identified vessels of anterior circulation of unclear significance *See table(s) above for TCD measurements and observations.  Diagnosing physician: Antony Contras MD Electronically signed by Antony Contras MD on 10/10/2020 at 12:18:38 PM.    Final         Scheduled Meds: . aspirin EC  81 mg Oral Daily  . digoxin  0.125 mg Intravenous Q6H  . enoxaparin (LOVENOX) injection  40 mg Subcutaneous Q24H  . mouth rinse  15 mL Mouth Rinse BID  . metoprolol tartrate  25 mg Oral BID  . mupirocin ointment  1 application Nasal BID  . sodium chloride flush  3 mL Intravenous Q12H  . sodium chloride flush  3 mL Intravenous Q12H   Continuous Infusions: . sodium chloride       LOS: 11 days        Alma Friendly, MD [facility]   If 7PM-7AM, please contact night-coverage 10/10/2020, 5:34 PM

## 2020-10-10 NOTE — Progress Notes (Signed)
Pt HR sustaining 130-170's. BP 131/74 (62). Dr. Horris Latino paged and order for 5mg  metoprolol IV. Rosaria Ferries PA paged as well. Red MEWS guidelines initiated. Will continue to monitor.  Clyde Canterbury, RN

## 2020-10-10 NOTE — Consult Note (Signed)
Neurology Consult H&P  CC: intermittent confusion  History is obtained from: chart review and patient  HPI: Brett Medina is a 60 y.o. male PMHx as reviewed below and previous smoker (recently quit) found to have left lung mass likely malignancy with associated pleural effusion (recently drained 950cc) with intermittent episodes of confusion and MRI showing acute embolic shower with associated petechial hemorrhages.  Denies fever, chills. Does have chronic cough.  The following taken from the chart:  10/10/2020 - Around 0230 pt HR jumping between 150s-170s for brief periods of time. When pt checked on, he had removed gown, was incontinent of urine, and somewhat anxious. Confused about where he was but easily reoriented. This is consistent w/ prior reports of on/off confusion. Pt w/ no pain or SOB, keeps repeating "I just feel like I need to be somewhere."   LKW: unclear tpa given?: Not candidate IR Thrombectomy? No, indication Modified Rankin Scale: 0-Completely asymptomatic and back to baseline post- stroke NIHSS: 2  ROS: A complete ROS was performed and is negative except as noted in the HPI.   History reviewed. No pertinent past medical history.   History reviewed. No pertinent family history.  Social History:  reports that he quit smoking about 3 months ago. His smoking use included cigarettes. He has never used smokeless tobacco. He reports current alcohol use. He reports previous drug use. Drug: Marijuana.   Prior to Admission medications   Medication Sig Start Date End Date Taking? Authorizing Provider  albuterol (VENTOLIN HFA) 108 (90 Base) MCG/ACT inhaler Inhale 2 puffs into the lungs every 6 (six) hours as needed. 09/14/20 09/14/21 Yes [provider]  benzonatate (TESSALON) 100 MG capsule Take 1 capsule by mouth every 6 (six) hours as needed for cough. 09/21/20 09/21/21 Yes [provider]  doxycycline (VIBRA-TABS) 100 MG tablet Take 100 mg by mouth 2  (two) times daily. 09/21/20  Yes [provider]    Exam: Current vital signs: BP (!) 143/99 (BP Location: Left Arm)   Pulse 100   Temp 98.3 F (36.8 C) (Oral)   Resp 20   Ht 5\' 9"  (1.753 m)   Wt 87.5 kg   SpO2 94%   BMI 28.49 kg/m   Physical Exam  Constitutional: Appears well-developed and well-nourished.  Psych: Affect appropriate to situation Eyes: No scleral injection HENT: No OP obstrucion Head: Normocephalic.  Cardiovascular: Normal rate and regular rhythm.  Respiratory: Effort normal and breath sounds normal to anterior ascultation GI: Soft.  No distension. There is no tenderness.  Skin: WDI  Neuro: Mental Status: Patient is awake, alert, oriented to person, year and to a degree his situation. Patient is not able to give a complete history. No signs of aphasia or neglect however left - right confusion. Cranial Nerves: II: Visual Fields left hemianopsia. Pupils are equal, round, and reactive to light. III,IV, VI: EOMI without ptosis or diploplia.  V: Facial sensation is symmetric to temperature VII: Facial movement is symmetric.  VIII: hearing is intact to voice X: Uvula elevates symmetrically XI: Shoulder shrug is symmetric. XII: tongue is midline without atrophy or fasciculations.  Motor: Tone is normal. Bulk is normal. 5/5 strength was present in all four extremities. Sensory: Sensation is symmetric to light touch and temperature in the arms and legs. Deep Tendon Reflexes: 2+ and symmetric in the biceps and patellae. Plantars: Mute Cerebellar: Mild truncal instability no holmes tremor. FNF and HKS are intact bilaterally.  I have reviewed labs in epic and the pertinent results  are: Results for Brett, Medina (MRN [id number]) as of 10/10/2020 03:11  Ref. Range 10/10/2020 01:33  Glucose Latest Ref Range: 70 - 99 mg/dL 110 (H)  BUN Latest Ref Range: 6 - 20 mg/dL 24 (H)  Creatinine Latest Ref Range: 0.61 - 1.24 mg/dL 1.52 (H)  Calcium Latest Ref  Range: 8.9 - 10.3 mg/dL 8.6 (L)    Ref. Range 10/10/2020 01:33  WBC Latest Ref Range: 4.0 - 10.5 K/uL 12.7 (H)    Ref. Range 10/10/2020 01:33  Alkaline Phosphatase Latest Ref Range: 38 - 126 U/L 175 (H)  Albumin Latest Ref Range: 3.5 - 5.0 g/dL 2.0 (L)  AST Latest Ref Range: 15 - 41 U/L 57 (H)  ALT Latest Ref Range: 0 - 44 U/L 44  Total Protein Latest Ref Range: 6.5 - 8.1 g/dL 5.1 (L)    I have reviewed the images obtained: MRI brain showed widely scattered abnormal signal throughout the brain most compatible with numerous acute and subacute embolic infarcts. Many of the areas demonstrate associated petechial hemorrhage - raising the possibility of septic emboli which are prone to bleeding - But there is no malignant hemorrhagic transformation or significant intracranial mass effect. No strong evidence of metastatic disease to the brain.   Assessment: Brett Medina is a 59 y.o. male PMHx as above recently found to have left lung mass likely malignant, intermittent confusion found to have embolic shower which is likely contributing to intermittent confusion however will need further monitoring as clinical picture may be confounded other comorbid disease and cannot exclude seizure. MRI with embolic shower with associated petechial hemorrhages in multiple vascular territories suggests cardiac source and possibly possibility of septic emboli which makes anticoagulation/antiplatelet therapy risky. However in setting of possible lung malignancy cannot rule out paraneoplastic hypercoagulability or metastatic disease. Though the MRI did not strongly suggest metastatic disease, it was done without contrast and therefore cannot be conclusive.  Plan: - Recommend vascular imaging MRA head and neck (possible contrast allergy as noted by chart review may preclude catheter angiography). - Recommend TTE for now and base on results and suspicion may consider TEE. - Recommend labs: HbA1c, lipid panel. - Recommend  Statin if LDL > 70 - SBP < 160. - Telemetry monitoring for arrhythmia. - Recommend bedside Swallow screen. - Recommend Stroke education. - Recommend PT/OT/SLP consult. - May need to pursue infectious workup.  Electronically signed by: Dr. Lynnae Sandhoff Pager: [phone] 10/10/2020, 3:10 AM

## 2020-10-10 NOTE — Progress Notes (Signed)
Around 0230 pt HR jumping between 150s-170s for brief periods of time. When pt checked on, he had removed gown, was incontinent of urine, and somewhat anxious. Confused about where he was but easily reoriented. This is consistent w/ prior reports of on/off confusion. Pt w/ no pain or SOB, keeps repeating "I just feel like I need to be somewhere." Obtained EKG, however pt HR was already back down in the low 100s at that time. Pt bathed and HR remained between 100-110s. Will continue to monitor.

## 2020-10-10 NOTE — Progress Notes (Signed)
TCD and carotid duplex has been completed.   Preliminary results in CV Proc.   Abram Sander 10/10/2020 11:31 AM

## 2020-10-10 NOTE — Evaluation (Signed)
Clinical/Bedside Swallow Evaluation Patient Details  Name: Brett Medina MRN: [id number] Date of Birth: 1961/07/05  Today's Date: 10/10/2020 Time: SLP Start Time (ACUTE ONLY): 0948 SLP Stop Time (ACUTE ONLY): 1000 SLP Time Calculation (min) (ACUTE ONLY): 12 min  Past Medical History: History reviewed. No pertinent past medical history. Past Surgical History:  Past Surgical History:  Procedure Laterality Date  . BRONCHIAL BIOPSY  10/08/2020   Procedure: BRONCHIAL BIOPSIES;  Surgeon: Collene Gobble, MD;  Location: [facility] ENDOSCOPY;  Service: Cardiopulmonary;;  . BRONCHIAL BRUSHINGS  10/08/2020   Procedure: BRONCHIAL BRUSHINGS;  Surgeon: Collene Gobble, MD;  Location: [facility] ENDOSCOPY;  Service: Cardiopulmonary;;  . HEMOSTASIS CONTROL  10/08/2020   Procedure: HEMOSTASIS CONTROL;  Surgeon: Collene Gobble, MD;  Location: [facility] ENDOSCOPY;  Service: Cardiopulmonary;;  . IR THORACENTESIS ASP PLEURAL SPACE W/IMG GUIDE  09/30/2020  . IR THORACENTESIS ASP PLEURAL SPACE W/IMG GUIDE  10/02/2020  . LEFT HEART CATH AND CORONARY ANGIOGRAPHY N/A 10/04/2020   Procedure: LEFT HEART CATH AND CORONARY ANGIOGRAPHY;  Surgeon: Sherren Mocha, MD;  Location: [place] CV LAB;  Service: Cardiovascular;  Laterality: N/A;  . VIDEO BRONCHOSCOPY N/A 10/08/2020   Procedure: VIDEO BRONCHOSCOPY WITH FLUORO;  Surgeon: Collene Gobble, MD;  Location: [place] ENDOSCOPY;  Service: Cardiopulmonary;  Laterality: N/A;   HPI:  59 y/o M presenting to ED on 11/28 for SOB that has gotten worse over past months. Found to have L chronic pleural effusions that continues to wrosen despite 11/29 and 12/1 L thorancentesis. CT on 11/28 also found R metastatic findings in R lung. Intermittent confusion also prompted MRI 12/8 that revealed numerous acute and subacute embolic infarcts. PMHx: tobacco use and LVEF 40-45%.   Assessment / Plan / Recommendation Clinical Impression  Pt's current swallowing function appears to be mostly impacted by mentation and  texture aversions. His swallowing appears to occur swiftly with thin liquids and purees, but when given any type of solid food, even when softened and given in small pieces, he subjectively says that it is hard to chew. He says that it is the texture that he does not like, more so than the taste. He also had a single episode of delayed coughing, reporting that the food felt like it was coming "back up," which may be more indicative of an esophageal component. Recommend trying a Dys 2 (chopped) diet to see if this will facilitate oral intake. SLP Visit Diagnosis: Dysphagia, oral phase (R13.11)    Aspiration Risk  Mild aspiration risk    Diet Recommendation Dysphagia 2 (Fine chop);Thin liquid   Liquid Administration via: Cup;Straw Medication Administration: Crushed with puree Supervision: Staff to assist with self feeding;Full supervision/cueing for compensatory strategies Compensations: Minimize environmental distractions;Slow rate;Small sips/bites Postural Changes: Seated upright at 90 degrees;Remain upright for at least 30 minutes after po intake    Other  Recommendations Oral Care Recommendations: Oral care BID   Follow up Recommendations Inpatient Rehab      Frequency and Duration min 2x/week  2 weeks       Prognosis Prognosis for Safe Diet Advancement: Good Barriers to Reach Goals: Cognitive deficits      Swallow Study   General HPI: 59 y/o M presenting to ED on 11/28 for SOB that has gotten worse over past months. Found to have L chronic pleural effusions that continues to wrosen despite 11/29 and 12/1 L thorancentesis. CT on 11/28 also found R metastatic findings in R lung. Intermittent confusion also prompted MRI 12/8 that revealed numerous acute  and subacute embolic infarcts. PMHx: tobacco use and LVEF 40-45%. Type of Study: Bedside Swallow Evaluation Previous Swallow Assessment: none in chart Diet Prior to this Study: Thin liquids;Regular Temperature Spikes Noted:  No Respiratory Status: Nasal cannula History of Recent Intubation:  (for procedure only on 12/7) Behavior/Cognition: Alert;Cooperative;Requires cueing Oral Care Completed by SLP: No Oral Cavity - Dentition: Dentures, top;Dentures, bottom Vision: Functional for self-feeding Self-Feeding Abilities: Total assist Patient Positioning: Upright in bed Baseline Vocal Quality: Normal Volitional Swallow: Able to elicit    Oral/Motor/Sensory Function Overall Oral Motor/Sensory Function: Within functional limits (although with difficulty following commands during assessment)   Ice Chips Ice chips: Not tested   Thin Liquid Thin Liquid: Within functional limits Presentation: Straw    Nectar Thick Nectar Thick Liquid: Not tested   Honey Thick Honey Thick Liquid: Not tested   Puree Puree: Within functional limits Presentation: Spoon   Solid     Solid: Impaired Oral Phase Impairments: Other (comment) (pt says he cannot chew different solid foods, needs more time to chew them but does clear his mouth well) Pharyngeal Phase Impairments: Cough - Delayed (said he felt like something came back up)      Osie Bond., M.A. [facility] Pager [phone] Office [phone]  10/10/2020,10:39 AM

## 2020-10-11 ENCOUNTER — Encounter: Payer: Self-pay | Admitting: *Deleted

## 2020-10-11 ENCOUNTER — Ambulatory Visit
Admit: 2020-10-11 | Discharge: 2020-10-11 | Disposition: A | Discharge status: Home / Self Care | Payer: Self-pay | Attending: Radiation Oncology | Admitting: Radiation Oncology

## 2020-10-11 DIAGNOSIS — G4701 Insomnia due to medical condition: Secondary | ICD-10-CM

## 2020-10-11 DIAGNOSIS — G893 Neoplasm related pain (acute) (chronic): Secondary | ICD-10-CM

## 2020-10-11 DIAGNOSIS — F419 Anxiety disorder, unspecified: Secondary | ICD-10-CM

## 2020-10-11 LAB — COMPREHENSIVE METABOLIC PANEL
ALT: 43 U/L (ref 0–44)
AST: 53 U/L — ABNORMAL HIGH (ref 15–41)
Albumin: 2.1 g/dL — ABNORMAL LOW (ref 3.5–5.0)
Alkaline Phosphatase: 195 U/L — ABNORMAL HIGH (ref 38–126)
Anion gap: 9 (ref 5–15)
BUN: 21 mg/dL — ABNORMAL HIGH (ref 6–20)
CO2: 25 mmol/L (ref 22–32)
Calcium: 8.5 mg/dL — ABNORMAL LOW (ref 8.9–10.3)
Chloride: 102 mmol/L (ref 98–111)
Creatinine, Ser: 1.36 mg/dL — ABNORMAL HIGH (ref 0.61–1.24)
GFR, Estimated: >60 mL/min (ref >60)
Glucose, Bld: 108 mg/dL — ABNORMAL HIGH (ref 70–99)
Potassium: 4.2 mmol/L (ref 3.5–5.1)
Sodium: 136 mmol/L (ref 135–145)
Total Bilirubin: 1 mg/dL (ref 0.3–1.2)
Total Protein: 5.4 g/dL — ABNORMAL LOW (ref 6.5–8.1)

## 2020-10-11 LAB — CBC WITH DIFFERENTIAL/PLATELET
Abs Immature Granulocytes: 0.21 10*3/uL — ABNORMAL HIGH (ref 0.00–0.07)
Basophils Absolute: 0.1 10*3/uL (ref 0.0–0.1)
Basophils Relative: 0 %
Eosinophils Absolute: 0 10*3/uL (ref 0.0–0.5)
Eosinophils Relative: 0 %
HCT: 35.3 % — ABNORMAL LOW (ref 39.0–52.0)
Hemoglobin: 11.3 g/dL — ABNORMAL LOW (ref 13.0–17.0)
Immature Granulocytes: 2 %
Lymphocytes Relative: 6 %
Lymphs Abs: 0.7 10*3/uL (ref 0.7–4.0)
MCH: 25.2 pg — ABNORMAL LOW (ref 26.0–34.0)
MCHC: 32 g/dL (ref 30.0–36.0)
MCV: 78.8 fL — ABNORMAL LOW (ref 80.0–100.0)
Monocytes Absolute: 1.5 10*3/uL — ABNORMAL HIGH (ref 0.1–1.0)
Monocytes Relative: 13 %
Neutro Abs: 9.1 10*3/uL — ABNORMAL HIGH (ref 1.7–7.7)
Neutrophils Relative %: 79 %
Platelets: 158 10*3/uL (ref 150–400)
RBC: 4.48 MIL/uL (ref 4.22–5.81)
RDW: 17.1 % — ABNORMAL HIGH (ref 11.5–15.5)
WBC: 11.5 10*3/uL — ABNORMAL HIGH (ref 4.0–10.5)
nRBC: 0 % (ref 0.0–0.2)

## 2020-10-11 LAB — IRON AND TIBC
Iron: 23 ug/dL — ABNORMAL LOW (ref 45–182)
Saturation Ratios: 10 % — ABNORMAL LOW (ref 17.9–39.5)
TIBC: 228 ug/dL — ABNORMAL LOW (ref 250–450)
UIBC: 205 ug/dL

## 2020-10-11 LAB — MAGNESIUM: Magnesium: 2 mg/dL (ref 1.7–2.4)

## 2020-10-11 LAB — VITAMIN B12: Vitamin B-12: 1059 pg/mL — ABNORMAL HIGH (ref 180–914)

## 2020-10-11 LAB — PHOSPHORUS: Phosphorus: 3.7 mg/dL (ref 2.5–4.6)

## 2020-10-11 LAB — FERRITIN: Ferritin: 772 ng/mL — ABNORMAL HIGH (ref 24–336)

## 2020-10-11 LAB — FOLATE: Folate: 10.7 ng/mL (ref >5.9)

## 2020-10-11 MED ORDER — DIGOXIN 125 MCG PO TABS
0.2500 mg | ORAL_TABLET | Freq: Every day | ORAL | Status: DC
  Administered 2020-10-11 – 2020-10-16 (×6): 0.25 mg via ORAL
  Filled 2020-10-11 (×6): qty 2

## 2020-10-11 MED ORDER — HYDROCODONE-ACETAMINOPHEN 5-325 MG PO TABS
1.0000 | ORAL_TABLET | ORAL | Status: DC | PRN
  Administered 2020-10-11: 2 via ORAL
  Filled 2020-10-11: qty 2

## 2020-10-11 MED ORDER — LORAZEPAM 2 MG/ML IJ SOLN
1.0000 mg | INTRAMUSCULAR | Status: DC | PRN
  Administered 2020-10-12 – 2020-10-16 (×3): 1 mg via INTRAVENOUS
  Filled 2020-10-11 (×3): qty 1

## 2020-10-11 MED ORDER — METOPROLOL TARTRATE 50 MG PO TABS
50.0000 mg | ORAL_TABLET | Freq: Two times a day (BID) | ORAL | Status: DC
  Administered 2020-10-11 – 2020-10-12 (×2): 50 mg via ORAL
  Filled 2020-10-11 (×2): qty 1

## 2020-10-11 MED ORDER — MORPHINE SULFATE (PF) 2 MG/ML IV SOLN
2.0000 mg | INTRAVENOUS | Status: DC | PRN
  Administered 2020-10-11: 2 mg via INTRAVENOUS
  Filled 2020-10-11: qty 1

## 2020-10-11 MED ORDER — MIRTAZAPINE 15 MG PO TBDP
15.0000 mg | ORAL_TABLET | Freq: Every day | ORAL | Status: DC
  Administered 2020-10-11 – 2020-10-15 (×4): 15 mg via ORAL
  Filled 2020-10-11 (×7): qty 1

## 2020-10-11 NOTE — Progress Notes (Signed)
Daily Progress Note   Patient Name: Brett Medina       Date: 10/11/2020 DOB: 12-13-1960  Age: 59 y.o. MRN#: [id number] Attending Physician: Alma Friendly, MD Primary Care Physician: Patient, No Pcp Per Admit Date: 09/29/2020  Reason for Consultation/Follow-up: Disposition, Establishing goals of care, Non pain symptom management, Pain control and Psychosocial/spiritual support  Subjective: Chart review performed. Received report from primary RN - no acute concerns. RN reports patient is still with minimal PO intake and very weak.  Went to visit patient at bedside - wife, two daughters, and son in law present. Patient was lying in bed, intermittently awake and alert - he fell asleep on and off during visit. When he was awake, he was able to participate in conversation; he remains disoriented to time. He denied pain but is experiencing shortness of breath. No signs or non-verbal gestures of pain or discomfort noted. No respiratory distress, increased work of breathing, or secretions noted.   Family meeting held again today after family and patient were able to speak with oncology. Therapeutic listening and emotional support provided as we reviewed information from oncology and how patient's cancer is not curable. We discussed options going forward. Patient expresses his desire to pursue palliative radiation if he's a candidate - family was supportive. Family expressed concern over how they would get him to treatments since they live 45 minutes away. Explained patient could remain admitted and receive treatments if that was their desire - family was agreeable to this. Education provided that patient would likely be transferred to [facility] for treatments - they expressed understanding. If patient is not a  candidate for palliative radiation, family and patient are ready to move forward with hospice. If he is a candidate, they will move forward with palliative radiation and then transition to hospice on discharge. Hospice philosophy and services were explained. The difference between home hospice and residential hospice was discussed and explained in detail. The patient's wife/Brett Medina explains that she is unable to provide the level of care the patient needs at home. Discussed if they pursue home hospice, home health aids were an option. Family expressed concern on next steps due to patient's lack of insurance - stated I would have TOC reach out to them with information. Briefly explained comfort measures in house if it came to that.  Family is most interested in residential hospice. Prognostication was discussed gently with family - at this time patient is not eating/drinking enough to sustain himself long term - explained that when he is discharged, he would likely be residential hospice appropriate. Discussed with family and patient their thoughts around feeding tube. We discussed that evidence has shown that PEG tubes in patients with advanced disease do not increase survival, prevent aspiration, or improve wound healing.  They can promote isolation and use of restraints leading to increased potential for pressure ulcers. Use of PEG tubes is not medically recommended in this population; rather, careful hand feeding with aspiration precautions has been shown to provide the best quality of life. Family stated they did not want to pursue a feeding tube understanding it would not change the overall outcome of the patient's situation. Family states that their main goal now is for the patient to remain comfortable and they "want him to go peacefully."   Visit also consisted of discussions dealing with the complex and emotionally intense issues of symptom management and palliative care in the setting of serious and  potentially life-threatening illness. Palliative care team will continue to support patient, patient's family, and medical team. A long discussion was had around patient's symptoms. Family explains he had a very uncomfortable night last night due to anxiety. Discussed symptom management plan for pain, dyspnea, anxiety, insomnia - family was agreeable and appreciative.  Family expressed deep desire to be able to have someone spend the night with the patient due to his anxiety and their limited time they know he has with wakeful good moments. I spoke with nursing staff who kindly agreed to allow 1 family member to stay each night.   Emotional support and therapeutic listening was provided throughout visit.  Provided family with extra copies of "Hard Choices for [employer]" book per their request.  All questions and concerns addressed. Encouraged to call with questions and/or concerns. PMT card provided.   Length of Stay: 12  Current Medications: Scheduled Meds:  . aspirin EC  81 mg Oral Daily  . atorvastatin  40 mg Oral Daily  . digoxin  0.25 mg Oral Daily  . enoxaparin (LOVENOX) injection  40 mg Subcutaneous Q24H  . mouth rinse  15 mL Mouth Rinse BID  . metoprolol tartrate  50 mg Oral BID  . mupirocin ointment  1 application Nasal BID  . sodium chloride flush  3 mL Intravenous Q12H  . sodium chloride flush  3 mL Intravenous Q12H    Continuous Infusions: . sodium chloride      PRN Meds: sodium chloride, acetaminophen **OR** acetaminophen, guaiFENesin-codeine, levalbuterol, loperamide, metoprolol tartrate, ondansetron **OR** ondansetron (ZOFRAN) IV, polyethylene glycol, sodium chloride flush  Physical Exam Vitals and nursing note reviewed.  Constitutional:      General: He is not in acute distress.    Appearance: He is ill-appearing.  Pulmonary:     Effort: No respiratory distress.  Skin:    General: Skin is warm and dry.  Neurological:     Mental Status: He is lethargic  and disoriented.     Motor: Weakness present.     Comments: Disoriented to time; oriented to self, place, situation  Psychiatric:        Mood and Affect: Mood is anxious.        Behavior: Behavior is cooperative.        Cognition and Memory: Cognition normal. Memory is impaired.  Vital Signs: BP (!) 136/97 (BP Location: Right Arm)   Pulse (!) 117   Temp 98 F (36.7 C) (Oral)   Resp 20   Ht 5\' 9"  (1.753 m)   Wt 87.8 kg   SpO2 98%   BMI 28.58 kg/m  SpO2: SpO2: 98 % O2 Device: O2 Device: Nasal Cannula O2 Flow Rate: O2 Flow Rate (L/min): 2 L/min  Intake/output summary:   Intake/Output Summary (Last 24 hours) at 10/11/2020 1230 Last data filed at 10/11/2020 1200 Gross per 24 hour  Intake 370.91 ml  Output 301 ml  Net 69.91 ml   LBM: Last BM Date: 10/08/20 (per report) Baseline Weight: Weight: 99.8 kg Most recent weight: Weight: 87.8 kg       Palliative Assessment/Data: PPS 20%    Flowsheet Rows   Flowsheet Row Most Recent Value  Intake Tab   Referral Department Hospitalist  Unit at Time of Referral Med/Surg Unit  Palliative Care Primary Diagnosis Cancer  Date Notified 10/10/20  Reason for referral Clarify Goals of Care  Date of Admission 10/10/20  Date first seen by Palliative Care 10/10/20  # of days Palliative referral response time 0 Day(s)  # of days IP prior to Palliative referral 0  Clinical Assessment   Psychosocial & Spiritual Assessment   Palliative Care Outcomes   Patient/Family meeting held? Yes  Who was at the meeting? patient, wife, two daughters, son in law  Palliative Care Outcomes Clarified goals of care, Counseled regarding hospice, Provided advance care planning, Provided psychosocial or spiritual support, Changed CPR status, Completed durable DNR  Patient/Family wishes: Interventions discontinued/not started  Mechanical Ventilation, BiPAP, NIPPV, Trach      Patient Active Problem List   Diagnosis Date Noted  . Cerebral  embolism with cerebral infarction 10/10/2020  . Mass of left lung 10/07/2020  . Acute systolic heart failure ([place])   . Demand ischemia ([place])   . Large pleural effusion 09/29/2020  . Elevated troponin 09/29/2020    Palliative Care Assessment & Plan   Patient Profile: 59 y.o. male  with no significant past medical history other than tobacco abuse x 45 years. Patient has not regularly follow with a provider for over 30 years. He was brought to the ED from home for increased shortness of breath. CT on 09/29/20 showed large left pleural effusion and suspected metastatic malignancy and lung nodules. He has undergone a thoracentesis on 11/29 and 12/1 with cytology. Brochoscopy was performed 10/08/20 - biopsies were taken that have resulted in non-small cell carcinoma, staged IV. During hospitalization, patient has also been newly diagnosed with CHF (EF 40-45%) and cardiology has been following. The patient developed acute encephalopathy and neurology was consulted - patient was found to have numerous acute and subacute embolic infarcts with associated petechial hemorrhage per MRI on 10/09/20.  Assessment: Non-small cell lung cancer, stage IV Large pleural effusion Elevated troponin Demand ischemia Acute systolic heart failure Cerebral embolism with cerebral infarction Acute respiratory failure with hypoxia Acute metabolic encephalopathy Shortness of breath Generalized weakness Nonobstructive CAD UTI  Recommendations/Plan:  Continue current medical treatment  Continue DNR/DNI as previously documented  Patient and family's goal, if a candidate, is to proceed with palliative radiation; they are hopeful it will improve patient's dyspnea for EOL comfort  After palliative radiation, family's plan is for discharge with hospice. Family are most interested in residential hospice. If patient continues with minimal PO intake, he will most likely be a candidate at discharge  Patient/family is not  interested in pursing  feeding tube  Started mirtazapine 15mg  disintegrating tablet at bedtime for insomnia  Started ativan 1mg  IV q4hr PRN for anxiety  Started norco 1-2 tablets PO q4hr PRN for moderate pain  Started morphine 2mg  IV q4hr PRN for severe pain/dyspnea  Ordered mattress replacement for prevention of pressure ulcers as patient is mostly now bedbound and unable to reposition himself  PMT will continue to follow holistically  Goals of Care and Additional Recommendations:  Limitations on Scope of Treatment: No Artificial Feeding, No Hemodialysis, No Surgical Procedures and No Tracheostomy  Code Status:    Code Status Orders  (From admission, onward)         Start     Ordered   10/10/20 1448  Do not attempt resuscitation (DNR)  Continuous       Question Answer Comment  In the event of cardiac or respiratory ARREST Do not call a "code blue"   In the event of cardiac or respiratory ARREST Do not perform Intubation, CPR, defibrillation or ACLS   In the event of cardiac or respiratory ARREST Use medication by any route, position, wound care, and other measures to relive pain and suffering. May use oxygen, suction and manual treatment of airway obstruction as needed for comfort.      10/10/20 1447        Code Status History    Date Active Date Inactive Code Status Order ID Comments User Context   09/29/2020 2226 10/10/2020 1447 Full Code [id number]  Bethena Roys, MD Inpatient   Advance Care Planning Activity       Prognosis:   < 2 weeks due to minimal PO intake  Discharge Planning:  Hospice facility  Care plan was discussed with patient, patient's family, TOC, primary RN, Dr. Horris Latino  Thank you for allowing the Palliative Medicine Team to assist in the care of this patient.   Total Time 120 minutes Prolonged Time Billed  yes       Greater than 50%  of this time was spent counseling and coordinating care related to the above assessment and  plan.  Lin Landsman, NP  Please contact Palliative Medicine Team phone at [phone] for questions and concerns.

## 2020-10-11 NOTE — Progress Notes (Signed)
I spoke with Dr. Burr Medico about this patient earlier today and reviewed his recent imaging including his CT scans of the chest abdomen pelvis and brain MRI. He has a newly diagnosed left lung cancer which appears to be stage four. 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 He is suffering with significant dyspnea. The patient may benefit from a short course of palliative localized radiotherapy to the left hilum an d left primary tumor site. I visited with the patient and his family this evening to show them the CT findings and discuss various treatment strategies. We reviewed the anticipated toxicity's including esophagitis as well as the potential benefit of radiation which include an 80% likelihood of tumor shrinkage In hopes of re-opening the left mainstem bronchi [place] for comfort and elimination of dyspnea. I would request of the patient be tra nsferred to [facility] over the course of this weekend to initiate a four treatment course of radiation starting Monday through Thursday of next week.

## 2020-10-11 NOTE — Progress Notes (Signed)
Per Mikey Bussing, NP I notified pathology to send recent [place] [facility] for molecular testing and PDL 1.

## 2020-10-11 NOTE — Progress Notes (Signed)
STROKE TEAM PROGRESS NOTE   INTERVAL HISTORY His  wife   Is  at the bedside.  His breathing seems a little better though it is still labored.  He remains in atrial flutter versus sinus multifocal atrial tachycardia.  He is on metoprolol.  Family has discussed treatment options for his newly diagnosed lung adenocarcinoma with oncology and waiting to make decision.  Palliative care has also been consulted  Vitals:   10/11/20 0527 10/11/20 0900 10/11/20 1021 10/11/20 1218  BP: (!) 126/97 125/90 (!) 136/97 (!) 130/105  Pulse: (!) 106 (!) 114 (!) 117 (!) 106  Resp: 20 20 20 20   Temp: 98.7 F (37.1 C) 98 F (36.7 C) 98 F (36.7 C) 98 F (36.7 C)  TempSrc: Oral Oral Oral Oral  SpO2: 100% 97% 98% 96%  Weight:      Height:       CBC:  Recent Labs  Lab 10/10/20 0133 10/11/20 0134  WBC 12.7* 11.5*  NEUTROABS 10.2* 9.1*  HGB 11.0* 11.3*  HCT 34.0* 35.3*  MCV 78.7* 78.8*  PLT 168 818   Basic Metabolic Panel:  Recent Labs  Lab 10/10/20 0133 10/11/20 0134  NA 137 136  K 4.5 4.2  CL 102 102  CO2 27 25  GLUCOSE 110* 108*  BUN 24* 21*  CREATININE 1.52* 1.36*  CALCIUM 8.6* 8.5*  MG 1.9 2.0  PHOS 3.3 3.7   Lipid Panel:  Recent Labs  Lab 10/10/20 1017  CHOL 130  TRIG 109  HDL 35*  CHOLHDL 3.7  VLDL 22  LDLCALC 73   HgbA1c: 5.7 on 09/30/20 Urine Drug Screen: No results for input(s): LABOPIA, COCAINSCRNUR, LABBENZ, AMPHETMU, THCU, LABBARB in the last 168 hours.  Alcohol Level No results for input(s): ETH in the last 168 hours.  IMAGING past 24 hours MR BRAIN W CONTRAST  Result Date: 10/10/2020 CLINICAL DATA:  59 year old male with recent diagnosis of metastatic disease, endobronchial left upper lobe biopsy positive for non-small cell carcinoma with markers suggesting lung primary. Noncontrast MRI yesterday revealing evidence of extensive embolic infarcts. Postcontrast images now in hopes of better evaluating the possibility of metastatic disease. EXAM: MRI HEAD WITH  CONTRAST TECHNIQUE: Multiplanar, multiecho pulse sequences of the brain and surrounding structures were obtained with intravenous contrast. CONTRAST:  67mL GADAVIST GADOBUTROL 1 MMOL/ML IV SOLN COMPARISON:  Noncontrast brain MRI yesterday. FINDINGS: Postcontrast coronal T2 and 3 plane T1 imaging. Scattered small, indistinct, and frequently gyriform areas of bilateral cerebral hemisphere enhancement. Multiple enhancing foci annotated on series 4. On correlation with DWI yesterday, virtually all of the areas correspond to foci of abnormal diffusion. Furthermore, there is no abnormal enhancement identified in the areas of DWI sparing yesterday, such as the right basal ganglia, bilateral thalami, brainstem, and much of the right cerebellum. No dural thickening or enhancement identified. No intracranial mass effect, ventriculomegaly. Compared to noncontrast T1 imaging yesterday the major dural venous sinuses are enhancing and appear to be patent. IMPRESSION: 1. Post ischemic enhancement: numerous scattered small enhancing foci which are frequently gyriform and indistinct, and correspond to areas of abnormal DWI yesterday. 2. No convincing cerebral metastatic disease at this time. Electronically Signed   By: Genevie Ann M.D.   On: 10/10/2020 19:26    PHYSICAL EXAM General: elderly man in moderate distress and chronically ill appearing. Psych: Affect appropriate to situation Eyes: No scleral injection HENT: No OP obstrucion Head: Normocephalic.  Cardiovascular: Afib on bedside monitor, PVCs noted. 12 lead also reviewed Respiratory: SOB, much effort  normal, course sounds GI: Soft.  No distension. There is no tenderness.  Skin: WDI, pale    Neurological Examination Mental Status: Lethargic, poorly attends, but with much effort will follow brief command, unable to attend long enough for complex commands. Able to state name, cannot name his dtr, nor month or further questions. Speech is sparse, dysarthric unable  to name objects. Cranial Nerves: II: Visual fields grossly normal III,IV, VI: ptosis not present, extra-ocular motions intact bilaterally, pupils equal, round, reactive to light and accommodation V,VII: smile symmetric, facial light touch sensation normal bilaterally VIII: hearing normal bilaterally IX,X: uvula rises symmetrically XI: bilateral shoulder shrug XII: midline tongue extension Motor: Diffusely weak; difficult exam d/t lack of cooperation. Did not seem to have lateralizing weakness.Tone and bulk:normal tone throughout; no atrophy noted Sensory: seems to have light touch intact throughout, bilaterally Deep Tendon Reflexes: 2+ and symmetric throughout Plantars: Right: downgoing   Left: downgoing Cerebellar: Unable to participate Gait: unable to test  ASSESSMENT/PLAN Mr. Brett Medina is a 59 y.o. male who did not see doctors, thus no previous history. He presents with progressive confusion over last 3 months and increased respiratory symptoms. Found to have lung mass and metastatic disease. Brain MRI w/o was done d/t ongoing AMS, incidentally scattered widespread patches of bilateral infarcts seen, some with hemorrhagic conversion, raising question of further ddx of septic emboli vs mets. Additionally, given new afib, possible malignancy induced hypercoagulable state, he is at high risk for ischemic stroke. Further etiology pending once MRI w/gad done to see if there is enhancement.   Strokes: multiple scattered strokes. Clear etiology pending as outlined above.  CT head: Hypoattenuating foci in the right frontal lobe and insula, could reflect remote sequela of prior ischemia or infarct. Though could be  better assessed on MR imaging as well  MRI: scattered widespread patches of bilateral infarcts seen, some with hemorrhagic conversion  Carotid Doppler: no stenosis  TCD: neg  2D Echo: Global hypokinesis, EF 45%  LDL 73  HgbA1c 5.7  VTE prophylaxis - lovenox    Diet    DIET DYS 2 Room service appropriate? Yes with Assist; Fluid consistency: Thin    none prior to admission, now on ASA 81mg  daily  Therapy recommendations:  Pending GOC  Disposition:  Pending GOC  Hypertension  Home meds:  none  Stable . No role in permissive hypertension  . Long-term BP goal normotensive  Hyperlipidemia  Home meds: none  LDL 73, goal < 70  None indicated at this time  No Diabetes  HgbA1c 5.7, goal < 7.0  CBGs  No results for input(s): GLUCAP in the last 72 hours.   SSI  Other Stroke Risk Factors  Cigarette smoker, recently quit  Metastatic cancer  Other Active Problems  Metastatic disease- wk up underway  Palliative Care consult  Hospital day # 12     Patient has altered mental status and confusion secondary to byicerebral embolic infarcts possibly from new onset A. fib though brain metastasis or infarct secondary to cancer related hyper coag ability of marantic endocarditis also other possibilities.  Patient's overall general medical condition appears quite poor and I think a palliative care approach would be more appropriate in this situation.  Long discussion with patient and wife at the bedside and with Dr. Horris Latino.  Patient is not a good long-term anticoagulation candidate at the present time due to his pleural effusion and poor general medical status.  Stroke team will sign off.  Kindly call for questions.  Antony Contras, MD  Antony Contras, MD Medical Director [facility] Pager: [phone] 10/11/2020 2:24 PM  To contact Stroke Continuity provider, please refer to [email or web address] After hours, contact General Neurology

## 2020-10-11 NOTE — Progress Notes (Signed)
SLP Cancellation Note  Patient Details Name: Brett Medina MRN: [id number] DOB: [birth year]   Cancelled treatment:       Reason Eval/Treat Not Completed: Other (comment) Attempted to see pt x2 this morning, but he was receiving nursing care. Will f/u as able.   Osie Bond., M.A. [facility] Pager [phone] Office [phone]  10/11/2020, 11:34 AM

## 2020-10-11 NOTE — Progress Notes (Addendum)
HEMATOLOGY-ONCOLOGY PROGRESS NOTE  SUBJECTIVE: The patient reports left shoulder pain today.  Just received pain medication just prior to my visit.  Somewhat groggy.  Has multiple family was at the bedside.  Family reports that he had increased confusion and agitation last night.  They met with palliative care earlier today.  The patient reports that he would like to proceed with radiation if he is a candidate.  We have already reached out to Dr. Tammi Klippel who will consult with the patient this evening.  REVIEW OF SYSTEMS:   Constitutional: Denies fevers, chills  Eyes: Denies blurriness of vision Ears, nose, mouth, throat, and face: Denies mucositis or sore throat Respiratory: Reports shortness of breath Cardiovascular: Denies palpitation, chest discomfort Gastrointestinal:  Denies nausea, heartburn or change in bowel habits Skin: Denies abnormal skin rashes Lymphatics: Denies new lymphadenopathy or easy bruising Neurological:Denies numbness, tingling or new weaknesses Behavioral/Psych: He had increased agitation and confusion last night Extremities: No lower extremity edema All other systems were reviewed with the patient and are negative.  I have reviewed the past medical history, past surgical history, social history and family history with the patient and they are unchanged from previous note.   PHYSICAL EXAMINATION: ECOG PERFORMANCE STATUS: 3 - Symptomatic, >50% confined to bed  Vitals:   10/11/20 1531 10/11/20 1600  BP:  125/86  Pulse: 90 (!) 112  Resp: 16 17  Temp:  98.9 F (37.2 C)  SpO2: 97% 96%   Filed Weights   10/09/20 0357 10/10/20 0324 10/11/20 0319  Weight: 87.5 kg 86.7 kg 87.8 kg    Intake/Output from previous day: 12/09 0701 - 12/10 0700 In: 250.9 [IV Piggyback:250.9] Out: 300 [Urine:300]  GENERAL: Awake but somewhat groggy secondary to pain medication LUNGS: Diminished breath sounds on the left HEART: regular rate & rhythm and no murmurs and 1+ lower  extremity extremity edema ABDOMEN:abdomen soft, non-tender and normal bowel sounds Musculoskeletal:no cyanosis of digits and no clubbing  NEURO: Alert but groggy secondary to pain medication  LABORATORY DATA:  I have reviewed the data as listed CMP Latest Ref Rng & Units 10/11/2020 10/10/2020 10/09/2020  Glucose 70 - 99 mg/dL 108(H) 110(H) 103(H)  BUN 6 - 20 mg/dL 21(H) 24(H) 21(H)  Creatinine 0.61 - 1.24 mg/dL 1.36(H) 1.52(H) 1.33(H)  Sodium 135 - 145 mmol/L 136 137 136  Potassium 3.5 - 5.1 mmol/L 4.2 4.5 4.4  Chloride 98 - 111 mmol/L 102 102 101  CO2 22 - 32 mmol/L $RemoveB'25 27 22  'qaoMiDEt$ Calcium 8.9 - 10.3 mg/dL 8.5(L) 8.6(L) 8.5(L)  Total Protein 6.5 - 8.1 g/dL 5.4(L) 5.1(L) 5.4(L)  Total Bilirubin 0.3 - 1.2 mg/dL 1.0 0.7 1.3(H)  Alkaline Phos 38 - 126 U/L 195(H) 175(H) 177(H)  AST 15 - 41 U/L 53(H) 57(H) 44(H)  ALT 0 - 44 U/L 43 44 41    Lab Results  Component Value Date   WBC 11.5 (H) 10/11/2020   HGB 11.3 (L) 10/11/2020   HCT 35.3 (L) 10/11/2020   MCV 78.8 (L) 10/11/2020   PLT 158 10/11/2020   NEUTROABS 9.1 (H) 10/11/2020    CT ABDOMEN PELVIS WO CONTRAST  Result Date: 10/02/2020 CLINICAL DATA:  59 year old male with cancer of unknown primary. EXAM: CT CHEST, ABDOMEN AND PELVIS WITHOUT CONTRAST TECHNIQUE: Multidetector CT imaging of the chest, abdomen and pelvis was performed following the standard protocol without IV contrast. COMPARISON:  Chest radiograph dated 10/02/2020. chest CT dated 09/29/2020. FINDINGS: Evaluation of this exam is limited in the absence of intravenous contrast. CT  CHEST FINDINGS Cardiovascular: There is no cardiomegaly or pericardial effusion. The thoracic aorta and central pulmonary arteries are grossly unremarkable. Mediastinum/Nodes: Mildly enlarged right paratracheal lymph node measures 12 mm. The esophagus and the thyroid gland are grossly unremarkable. No mediastinal fluid collection. Lungs/Pleura: There is a 10.0 x 7.5 x 12.0 cm mass in the left upper lobe.  There is diffuse interstitial coarsening and nodularity of the left lung with smaller scattered nodules concerning for metastatic spread. There is a small left pleural effusion, likely malignant effusion. There is consolidative changes of the majority of the left lung which may be combination of atelectasis, infiltrate, or metastasis. Several scattered nodules in the right lung measure up to 1 cm consistent with metastatic disease. There is a patchy area of nodular and ground-glass density in the right infrahilar region which may represent pneumonia, or aspiration. Metastasis is not excluded. There is background of moderate to severe centrilobular emphysema. No pneumothorax. There is compression and high-grade narrowing of the left lower lobe bronchus and occlusion of the left upper lobe bronchus by the left lung mass. There is apparent nodular thickening of the left pleural consistent with metastatic implant. Musculoskeletal: There is sclerotic lesion in the T6 as well as sclerotic changes of C7. Small sclerotic focus in T9. CT ABDOMEN PELVIS FINDINGS No intra-abdominal free air or free fluid. Hepatobiliary: Multiple hepatic hypodense lesions consistent with metastatic disease. These measure up to 2.5 cm in the left lobe of the liver. 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 No calcified gallstone or pericholecystic fluid. Pancreas: The pancreas is grossly unremarkable. Spleen: Heterogeneous appearance of the spleen. Adrenals/Urinary Tract: The adrenal glands are unremarkable. Multiple nonobstructing bilateral renal calculi measure up to 8 mm in the inferior pole of the right kidney. There is no hydronephrosis on either side. There is a 6 cm right renal cyst. The visualized ureters and urinary bladder appear unremarkable. Stomach/Bowel: There is circumferential thickening of the rectosigmoid involving approximately 9 cm segment (95/8) which may be inflammatory in etiology. There is however  apparent shouldering of the soft tissue (sagittal 93/8) concerning for underlying malignancy. Clinical correlation and further evaluation with sigmoidoscopy or colonoscopy is recommended. There is moderate stool throughout the colon. There is colonic diverticulosis without active inflammatory changes. There is no bowel obstruction. The appendix is normal. Vascular/Lymphatic: The abdominal aorta and IVC unremarkable. No portal venous gas. No adenopathy. Reproductive: The prostate and seminal vesicles are grossly unremarkable. Other: Subcutaneous edema of the lateral pelvic wall. No fluid collection. Musculoskeletal: Faint irregular lucency involving the left superior pubic ramus concerning for metastatic disease. Several scattered small sclerotic lesions also noted involving the right ischium, sacral bone also concerning for metastatic disease. No acute fracture. IMPRESSION: 1. Large left upper lobe mass with findings of metastatic disease in the lungs, liver, and bones. 2. Circumferential thickening of the rectosigmoid with apparent shouldering concerning for underlying malignancy. Clinical correlation and further evaluation with sigmoidoscopy or colonoscopy is recommended. 3. Nonobstructing bilateral renal calculi. No hydronephrosis. 4. Colonic diverticulosis. No bowel obstruction. Normal appendix. 5. Aortic Atherosclerosis (ICD10-I70.0) and Emphysema (ICD10-J43.9). Electronically Signed   By: Anner Crete M.D.   On: 10/02/2020 19:46   DG Chest 1 View  Result Date: 10/02/2020 CLINICAL DATA:  Status post thoracentesis EXAM: CHEST  1 VIEW COMPARISON:  October 01, 2020 FINDINGS: No pneumothorax. There remains extensive opacity throughout the left lung, largely due to airspace consolidation but with questionable residual pleural effusion. Ill-defined opacity right base is stable. No new  opacity evident. Stable cardiac prominence. Pulmonary vascular on the right appears unremarkable. Pulmonary vascularity on  the left is obscured. No bone lesions. IMPRESSION: No pneumothorax. Opacification of most of the left hemithorax remains, likely due to consolidation with questionable residual pleural effusion. Suspect patchy pneumonia right base, stable. No new opacity evident. Cardiac silhouette appears stable. Electronically Signed   By: Lowella Grip III M.D.   On: 10/02/2020 08:50   DG Chest 1 View  Result Date: 09/30/2020 CLINICAL DATA:  Post thoracentesis EXAM: CHEST  1 VIEW COMPARISON:  09/29/2020 FINDINGS: Slightly decreased, still large left pleural effusion. Some improvement in left lung aeration primarily at the apex. No pneumothorax. Stable nodule overlying the mid right lung. Unchanged mild right basilar interstitial prominence. Similar cardiomediastinal contours. IMPRESSION: Some improvement of left lung aeration after thoracentesis. No pneumothorax. Persistent large left pleural effusion with associated atelectasis and possible underlying consolidation. Unchanged right basilar interstitial prominence and right mid lung nodule. Electronically Signed   By: Macy Mis M.D.   On: 09/30/2020 09:48   DG Chest 2 View  Result Date: 10/08/2020 CLINICAL DATA:  Pleural effusion. EXAM: CHEST - 2 VIEW COMPARISON:  10/02/2020. FINDINGS: Worsening, now complete opacification of the left lung. Similar right perihilar and right basilar opacities. Cardiomediastinal silhouette is largely obscured. No acute osseous abnormality. No visible pneumothorax. IMPRESSION: 1. Worsening, now complete opacification of the left lung that is concerning for a combination of pleural effusion and consolidation. 2. Similar right perihilar and right basilar opacities. Electronically Signed   By: Margaretha Sheffield MD   On: 10/08/2020 07:59   DG Chest 2 View  Result Date: 09/29/2020 CLINICAL DATA:  Shortness of breath EXAM: CHEST - 2 VIEW COMPARISON:  October 27, 2004 FINDINGS: There is essentially complete consolidation of the left  hemithorax with probable combination of pleural effusion and extensive airspace consolidation throughout the left lung. There is a nodular opacity in the periphery of the right mid lung measuring 1.0 x 0.9 cm. There is interstitial prominence on the right which in part may be due to redistribution of blood flow to viable lung segments. Heart size appears grossly normal. Pulmonary vascularity on the right is normal. No adenopathy is evident in areas that can be assessed for potential adenopathy by radiography. Note that opacification on the left precludes assessment for potential adenopathy on the left. No bone lesions. IMPRESSION: Widespread airspace opacity on the left, likely due to combination of consolidation and pleural effusion. Underlying neoplasm on the left cannot be excluded. Given this circumstance, correlation with contrast enhanced chest CT may well be advisable at this time to further evaluate Nodular opacity periphery of right mid lung concerning for potential small neoplastic focus. Interstitial prominence on the right may in large part be due to redistribution of blood flow to viable lung segments. Heart size within normal limits. Electronically Signed   By: Lowella Grip III M.D.   On: 09/29/2020 16:13   CT HEAD WO CONTRAST  Result Date: 10/02/2020 CLINICAL DATA:  Cancer of unknown primary EXAM: CT HEAD WITHOUT CONTRAST TECHNIQUE: Contiguous axial images were obtained from the base of the skull through the vertex without intravenous contrast. COMPARISON:  None. FINDINGS: Brain: Multiple acquisitions required due to patient motion artifact. Hypoattenuating foci are seen subcortical white matter of the right frontal lobe (8/8 and right insula (8/1) without significant surrounding vasogenic edema. Additional hyperattenuating focus seen near the gray-white interface of the high left frontal lobe (10/40). With some questionable surrounding vasogenic edema. There  is a background of chronic  microvascular angiopathy and parenchymal volume loss. No other acute intracranial abnormality is seen. No significant mass effect or midline shift is evident at this time. Vascular: Atherosclerotic calcification of the carotid siphons and intradural vertebral arteries. No hyperdense vessel. Skull: No calvarial fracture or suspicious osseous lesion. No scalp swelling or hematoma. Sinuses/Orbits: Paranasal sinuses and mastoid air cells are predominantly clear. Included orbital structures are unremarkable. Other: None. IMPRESSION: Severely motion degraded imaging despite multiple attempts at acquisition. Ill-defined hyperattenuating focus near the gray-white junction in the high left frontal lobe with some questionable surrounding hypoattenuation, possibly vasogenic edema raises concern for potential metastatic focus in the setting of cancer of unknown primary. Consider further evaluation with contrast enhanced MRI as patient is able to tolerate. May require sedation given extensive motion artifact on this CT imaging. Hypoattenuating foci in the right frontal lobe and insula, could reflect remote sequela of prior ischemia or infarct. Though could be better assessed on MR imaging as well. Electronically Signed   By: Lovena Le M.D.   On: 10/02/2020 19:37   CT CHEST WO CONTRAST  Result Date: 10/02/2020 CLINICAL DATA:  59 year old male with cancer of unknown primary. EXAM: CT CHEST, ABDOMEN AND PELVIS WITHOUT CONTRAST TECHNIQUE: Multidetector CT imaging of the chest, abdomen and pelvis was performed following the standard protocol without IV contrast. COMPARISON:  Chest radiograph dated 10/02/2020. chest CT dated 09/29/2020. FINDINGS: Evaluation of this exam is limited in the absence of intravenous contrast. CT CHEST FINDINGS Cardiovascular: There is no cardiomegaly or pericardial effusion. The thoracic aorta and central pulmonary arteries are grossly unremarkable. Mediastinum/Nodes: Mildly enlarged right  paratracheal lymph node measures 12 mm. The esophagus and the thyroid gland are grossly unremarkable. No mediastinal fluid collection. Lungs/Pleura: There is a 10.0 x 7.5 x 12.0 cm mass in the left upper lobe. There is diffuse interstitial coarsening and nodularity of the left lung with smaller scattered nodules concerning for metastatic spread. There is a small left pleural effusion, likely malignant effusion. There is consolidative changes of the majority of the left lung which may be combination of atelectasis, infiltrate, or metastasis. Several scattered nodules in the right lung measure up to 1 cm consistent with metastatic disease. There is a patchy area of nodular and ground-glass density in the right infrahilar region which may represent pneumonia, or aspiration. Metastasis is not excluded. There is background of moderate to severe centrilobular emphysema. No pneumothorax. There is compression and high-grade narrowing of the left lower lobe bronchus and occlusion of the left upper lobe bronchus by the left lung mass. There is apparent nodular thickening of the left pleural consistent with metastatic implant. Musculoskeletal: There is sclerotic lesion in the T6 as well as sclerotic changes of C7. Small sclerotic focus in T9. CT ABDOMEN PELVIS FINDINGS No intra-abdominal free air or free fluid. Hepatobiliary: Multiple hepatic hypodense lesions consistent with metastatic disease. These measure up to 2.5 cm in the left lobe of the liver. 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 No calcified gallstone or pericholecystic fluid. Pancreas: The pancreas is grossly unremarkable. Spleen: Heterogeneous appearance of the spleen. Adrenals/Urinary Tract: The adrenal glands are unremarkable. Multiple nonobstructing bilateral renal calculi measure up to 8 mm in the inferior pole of the right kidney. There is no hydronephrosis on either side. There is a 6 cm right renal cyst. The visualized ureters and  urinary bladder appear unremarkable. Stomach/Bowel: There is circumferential thickening of the rectosigmoid involving approximately 9 cm segment (95/8) which  may be inflammatory in etiology. There is however apparent shouldering of the soft tissue (sagittal 93/8) concerning for underlying malignancy. Clinical correlation and further evaluation with sigmoidoscopy or colonoscopy is recommended. There is moderate stool throughout the colon. There is colonic diverticulosis without active inflammatory changes. There is no bowel obstruction. The appendix is normal. Vascular/Lymphatic: The abdominal aorta and IVC unremarkable. No portal venous gas. No adenopathy. Reproductive: The prostate and seminal vesicles are grossly unremarkable. Other: Subcutaneous edema of the lateral pelvic wall. No fluid collection. Musculoskeletal: Faint irregular lucency involving the left superior pubic ramus concerning for metastatic disease. Several scattered small sclerotic lesions also noted involving the right ischium, sacral bone also concerning for metastatic disease. No acute fracture. IMPRESSION: 1. Large left upper lobe mass with findings of metastatic disease in the lungs, liver, and bones. 2. Circumferential thickening of the rectosigmoid with apparent shouldering concerning for underlying malignancy. Clinical correlation and further evaluation with sigmoidoscopy or colonoscopy is recommended. 3. Nonobstructing bilateral renal calculi. No hydronephrosis. 4. Colonic diverticulosis. No bowel obstruction. Normal appendix. 5. Aortic Atherosclerosis (ICD10-I70.0) and Emphysema (ICD10-J43.9). Electronically Signed   By: Anner Crete M.D.   On: 10/02/2020 19:46   CT Chest Wo Contrast  Result Date: 09/29/2020 CLINICAL DATA:  Chest pain and shortness of breath worsening for the past 2 months. Evaluation for malignancy, infection, mass, fluid. EXAM: CT CHEST WITHOUT CONTRAST TECHNIQUE: Multidetector CT imaging of the chest was  performed following the standard protocol without IV contrast. COMPARISON:  Chest x-ray 09/29/2020, chest x-ray 10/27/2004 FINDINGS: Cardiovascular: Normal heart size. Trace pericardial effusion. The aortic root appears to be normal in caliber. Suggestion of possible mild aortic valve leaflet calcifications. The ascending thoracic aorta is enlarged in caliber measuring up to 4.5 cm. The descending thoracic aorta is normal in caliber. Mild atherosclerotic plaque of the thoracic aorta. Possible mild left anterior descending coronary artery calcification. Mediastinum/Nodes: There is a 1.1 cm infra cardiac enlarged round lymph node (2:127). Enlarged mediastinal lymph nodes with as an example a right 1 cm paratracheal lymph node (2: 44) and a enlarged 1.1 cm subcarinal lymph node (2:65). Limited sensitivity for the detection of hilar adenopathy on this noncontrast study. No axillary lymphadenopathy bilaterally. Lungs/Pleura: At least mild paraseptal and severe centrilobular emphysematous changes. Almost complete collapse of the left upper and lower lobes. There is a 1 cm solid round pulmonary nodule within the right middle lobe (4:83,99). Slightly more inferior, there there are a 0.6 cm right middle lobe pulmonary nodules (4:89). Several pulmonary micronodules at the right apex (4:17, 24, 31). Couple of round pulmonary nodule within the right lower lobe measuring 0.5 and 0.6 cm (4:1 16-117). Ground-glass subpleural nodule measuring 0.7 cm in the right middle lobe (4:96). Peribronchovascular nodular like 3.2 cm ground-glass airspace opacity within the right lower lobe (4:111). Diffuse mild bronchial wall thickening. Moderate to large volume simple fluid left pleural effusion. No right pleural effusion. No pneumothorax. Upper Abdomen: Suggestion of a partially visualized splenule. Otherwise no acute abnormality. Musculoskeletal: Indeterminate 1 cm subcutaneus soft tissue density within the left anterolateral back at the  level of scapula (2:55). Several other subcentimeter soft tissue densities within the left lower back (2:68, 99). Possible seroma findings that is collimated off view within the right anterolateral back (2:52). Sclerotic thoracic vertebra lesions within the T6, T9, T11, T12 vertebral bodies. Possible tiny sclerotic foci within the left aspect of the T7 vertebral body (6:103). Similarly within the left aspect of the T4 and T5 vertebral bodies. Sclerotic lesion  within the right transverse process of the T3 vertebral body (4:10). No acute displaced fracture. Multilevel mild degenerative changes of the spine. IMPRESSION: 1. Constellation of findings consistent with metastatic malignancy. 2. Moderate to large volume left pleural effusion with almost complete collapse of the left upper and lower lobes. Associated mediastinal lymphadenopathy and limited evaluation for hilar lymphadenopathy on this noncontrast study. Underlying malignancy suspected (ddx includes metastasis or primary lung). Consider analysis of the pleural fluid for further evaluation. 3. Indeterminate multifocal right lung pulmonary nodules measuring up to 1 cm in the right middle lobe. 4. Indeterminate 3.2 cm peribronchovascular ground-glass airspace opacity within the right lower lobe. 5. Scattered suspicious sclerotic lesions concerning for osseous metastases. 6. Indeterminate pericentimeter subcutaneus soft tissue densities within the bilateral anterolateral back at the level of scapulas. 7. Severe emphysematous changes. 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 9. Aortic Atherosclerosis (ICD10-I70.0) and Emphysema (ICD10-J43.9). Electronically Signed   By: Iven Finn M.D.   On: 09/29/2020 17:19   MR BRAIN WO CONTRAST  Addendum Date: 10/09/2020   ADDENDUM REPORT: 10/09/2020 16:31 ADDENDUM: Study discussed by telephone with Dr. Lesia Sago on 10/09/2020 at 1620 hours. Electronically Signed   By: Genevie Ann M.D.   On: 10/09/2020  16:31   Result Date: 10/09/2020 CLINICAL DATA:  59 year old male with altered mental status, confusion. Metastatic disease unknown primary, left lung biopsy results pending. EXAM: MRI HEAD WITHOUT CONTRAST TECHNIQUE: Multiplanar, multiecho pulse sequences of the brain and surrounding structures were obtained without intravenous contrast. COMPARISON:  Head CT 10/02/2020. CT Chest, Abdomen, and Pelvis 10/02/2020. FINDINGS: Brain: Widespread abnormal restricted diffusion throughout the brain. Lesions range from punctate to patchy, confluent areas. Superior frontal and parietal lobes among those most severely affected (series 5, image 93). Largest diffusion restricted lesion is in the left occipital pole, 3 cm (series 5, image 79). Bilateral cerebellar involvement greater on the left (2.4 cm). Left basal ganglia affected. Other deep gray nuclei and brainstem appear spared. On T2 and FLAIR imaging these areas appear is gyriform and subcortical hyperintense signal in keeping with cytotoxic edema. Some areas demonstrate central cystic change and appear more subacute (series 5, image 90). Occasional rounded areas of signal abnormality are noted (left cerebellum series 11, image 5). SWI demonstrates microhemorrhage at many of the affected sites. And also chronic superimposed microhemorrhage such as at the left dorsal thalamus on series 14 image 23. No malignant hemorrhagic transformation. No extra-axial or intraventricular blood. No ventriculomegaly. No intracranial mass effect or midline shift. Normal basilar cisterns. Cervicomedullary junction and pituitary are within normal limits. Vascular: Major intracranial vascular flow voids are preserved. Generalized intracranial artery tortuosity. Skull and upper cervical spine: Visualized bone marrow signal is within normal limits. Partially visible cervical spine degeneration. Sinuses/Orbits: Negative. Other: Mastoids are clear. Grossly normal visible internal auditory  structures. Visible scalp and face soft tissues appear negative. IMPRESSION: 1. 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 Many of the areas demonstrate associated petechial hemorrhage - raising the possibility of septic emboli which are prone to bleeding - but there is no malignant hemorrhagic transformation or significant intracranial mass effect. 2. No strong evidence of metastatic disease to the brain on this non-contrast exam. Note that some of the above infarcts will likely enhance on subsequent MRI without and with contrast, which will confound staging for metastases to a degree. Electronically Signed: By: Genevie Ann M.D. 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 Noncontrast MRI yesterday revealing evidence of extensive embolic infarcts. Postcontrast images now in hopes of better evaluating the possibility of metastatic disease. EXAM: MRI HEAD WITH CONTRAST TECHNIQUE: Multiplanar, multiecho pulse sequences of the brain and surrounding structures were obtained with intravenous contrast. CONTRAST:  14mL GADAVIST GADOBUTROL 1 MMOL/ML IV SOLN COMPARISON:  Noncontrast brain MRI yesterday. FINDINGS: Postcontrast coronal T2 and 3 plane T1 imaging. Scattered small, indistinct, and frequently gyriform areas of bilateral cerebral hemisphere enhancement. Multiple enhancing foci annotated on series 4. On correlation with DWI yesterday, virtually all of the areas correspond to foci of abnormal diffusion. Furthermore, there is no abnormal enhancement identified in the areas of DWI sparing yesterday, such as the right basal ganglia, bilateral thalami, brainstem, and much of the right cerebellum. No dural thickening or enhancement  identified. No intracranial mass effect, ventriculomegaly. Compared to noncontrast T1 imaging yesterday the major dural venous sinuses are enhancing and appear to be patent. IMPRESSION: 1. Post ischemic enhancement: numerous scattered small enhancing foci which are frequently gyriform and indistinct, and correspond to areas of abnormal DWI yesterday. 2. No convincing cerebral metastatic disease at this time. Electronically Signed   By: Genevie Ann M.D.   On: 10/10/2020 19:26   CARDIAC CATHETERIZATION  Result Date: 10/04/2020  Dist LAD lesion is 50% stenosed.  1st RPL lesion is 20% stenosed.  Mid RCA lesion is 25% stenosed.  2nd Diag lesion is 40% stenosed.  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 No severe lesions or culprit for NSTEMI - suspect demand ischemia   DG CHEST PORT 1 VIEW  Result Date: 10/10/2020 CLINICAL DATA:  Tachycardia, shortness of breath EXAM: PORTABLE CHEST 1 VIEW COMPARISON:  10/08/2020 FINDINGS: Continued complete opacification of the left hemithorax, unchanged. Right basilar patchy airspace opacity again noted, unchanged. No visible effusion on the right. No acute bony abnormality. IMPRESSION: No significant change since prior study. Electronically Signed   By: Rolm Baptise M.D.   On: 10/10/2020 12:27   DG CHEST PORT 1 VIEW  Result Date: 10/01/2020 CLINICAL DATA:  Cough, shortness of breath EXAM: PORTABLE CHEST 1 VIEW COMPARISON:  09/30/2020 chest radiograph and prior. FINDINGS: Increased size of large left pleural effusion with decreased left upper lung aeration. Right mid lung nodular opacity is unchanged. No pneumothorax. Right perihilar and basilar opacities, unchanged. Partial obscured cardiomediastinal silhouette. IMPRESSION: 1.  Increased size of large left pleural effusion with decreased left upper lung aeration. 2. Right perihilar/basilar opacities are grossly unchanged. Electronically Signed   By: Primitivo Gauze M.D.   On: 10/01/2020 10:42   ECHOCARDIOGRAM COMPLETE  Result Date: 09/30/2020    ECHOCARDIOGRAM REPORT   Patient Name:   JAVIONE GUNAWAN Date of Exam: 09/30/2020 Medical Rec #:  [id number]     Height:       69.0 in Accession #:    [id number]    Weight:       214.3 lb Date of Birth:  [birth year]      BSA:          2.127 m Patient Age:    50 years      BP:  151/111 mmHg Patient Gender: M             HR:           111 bpm. Exam Location:  Inpatient Procedure: 2D Echo, Cardiac Doppler and Color Doppler Indications:    Elevated Troponin  History:        Patient has no prior history of Echocardiogram examinations.                 Risk Factors:Former Smoker.  Sonographer:    Renella Cunas RDCS Referring Phys: [id number] Beecham [facility] IMPRESSIONS  1. Left ventricular ejection fraction, by estimation, is 40 to 45%. The left ventricle has mildly decreased function. The left ventricle demonstrates global hypokinesis. Left ventricular diastolic parameters are indeterminate.  2. Right ventricular systolic function is normal. The right ventricular size is normal. Tricuspid regurgitation signal is inadequate for assessing PA pressure.  3. The mitral valve is normal in structure. No evidence of mitral valve regurgitation. No evidence of mitral stenosis.  4. The aortic valve was not well visualized. Aortic valve regurgitation is trivial. No aortic stenosis is present.  5. Aortic dilatation noted. There is mild dilatation of the ascending aorta, measuring 37 mm.  6. The inferior vena cava is dilated in size with <50% respiratory variability, suggesting right atrial pressure of 15 mmHg. FINDINGS  Left Ventricle: Left ventricular ejection fraction, by estimation, is 40 to 45%. The left ventricle has mildly decreased function. The left  ventricle demonstrates global hypokinesis. Definity contrast agent was given IV to delineate the left ventricular  endocardial borders. The left ventricular internal cavity size was normal in size. There is no left ventricular hypertrophy. Left ventricular diastolic parameters are indeterminate. Right Ventricle: The right ventricular size is normal. No increase in right ventricular wall thickness. Right ventricular systolic function is normal. Tricuspid regurgitation signal is inadequate for assessing PA pressure. Left Atrium: Left atrial size was normal in size. Right Atrium: Right atrial size was not well visualized. Pericardium: There is no evidence of pericardial effusion. Mitral Valve: The mitral valve is normal in structure. No evidence of mitral valve regurgitation. No evidence of mitral valve stenosis. Tricuspid Valve: The tricuspid valve is normal in structure. Tricuspid valve regurgitation is trivial. Aortic Valve: The aortic valve was not well visualized. Aortic valve regurgitation is trivial. No aortic stenosis is present. Pulmonic Valve: The pulmonic valve was not well visualized. Pulmonic valve regurgitation is not visualized. Aorta: The aortic root is normal in size and structure and aortic dilatation noted. There is mild dilatation of the ascending aorta, measuring 37 mm. Venous: The inferior vena cava is dilated in size with less than 50% respiratory variability, suggesting right atrial pressure of 15 mmHg. IAS/Shunts: The interatrial septum was not well visualized.  LEFT VENTRICLE PLAX 2D LVIDd:         5.10 cm      Diastology LVIDs:         4.40 cm      LV e' medial:    4.46 cm/s LV PW:         0.90 cm      LV E/e' medial:  14.5 LV IVS:        0.90 cm      LV e' lateral:   7.07 cm/s LVOT diam:     2.10 cm      LV E/e' lateral: 9.1 LV SV:         50 LV SV Index:   23 LVOT Area:  3.46 cm  LV Volumes (MOD) LV vol d, MOD A2C: 102.0 ml LV vol d, MOD A4C: 131.0 ml LV vol s, MOD A2C: 68.2 ml LV vol  s, MOD A4C: 81.5 ml LV SV MOD A2C:     33.8 ml LV SV MOD A4C:     131.0 ml LV SV MOD BP:      41.2 ml RIGHT VENTRICLE RV S prime:     19.40 cm/s TAPSE (M-mode): 2.2 cm LEFT ATRIUM             Index       RIGHT ATRIUM           Index LA diam:        2.30 cm 1.08 cm/m  RA Area:     11.50 cm LA Vol (A2C):   36.2 ml 17.02 ml/m RA Volume:   28.90 ml  13.58 ml/m LA Vol (A4C):   16.7 ml 7.85 ml/m LA Biplane Vol: 27.1 ml 12.74 ml/m  AORTIC VALVE LVOT Vmax:   99.20 cm/s LVOT Vmean:  73.000 cm/s LVOT VTI:    0.143 m  AORTA Ao Root diam: 3.90 cm Ao Asc diam:  3.70 cm Ao Desc diam: 2.60 cm MITRAL VALVE MV Area (PHT): 9.85 cm     SHUNTS MV Decel Time: 77 msec      Systemic VTI:  0.14 m MV E velocity: 64.50 cm/s   Systemic Diam: 2.10 cm MV A velocity: 101.00 cm/s MV E/A ratio:  0.64 Oswaldo Milian MD Electronically signed by Oswaldo Milian MD Signature Date/Time: 09/30/2020/9:27:23 PM    Final    VAS US CAROTID  Result Date: 10/10/2020 Carotid Arterial Duplex Study Indications:       CVA. Limitations        Today's exam was limited due to the patient's respiratory                    variation. Comparison Study:  no prior Performing Technologist: Abram Sander RVS  Examination Guidelines: A complete evaluation includes B-mode imaging, spectral Doppler, color Doppler, and power Doppler as needed of all accessible portions of each vessel. Bilateral testing is considered an integral part of a complete examination. Limited examinations for reoccurring indications may be performed as noted.  Right Carotid Findings: +----------+--------+--------+--------+------------------+--------+           PSV cm/sEDV cm/sStenosisPlaque DescriptionComments +----------+--------+--------+--------+------------------+--------+ CCA Prox  66      7               heterogenous               +----------+--------+--------+--------+------------------+--------+ CCA Distal73      14              heterogenous                +----------+--------+--------+--------+------------------+--------+ ICA Prox  36      9       1-39%   heterogenous               +----------+--------+--------+--------+------------------+--------+ ICA Distal37      17                                         +----------+--------+--------+--------+------------------+--------+ ECA       87      9                                          +----------+--------+--------+--------+------------------+--------+ +----------+--------+-------+--------+-------------------+  PSV cm/sEDV cmsDescribeArm Pressure (mmHG) +----------+--------+-------+--------+-------------------+ [id number]                                         +----------+--------+-------+--------+-------------------+ +---------+--------+--+--------+--+---------+ VertebralPSV cm/s73EDV cm/s19Antegrade +---------+--------+--+--------+--+---------+  Left Carotid Findings: +----------+--------+--------+--------+------------------+--------+           PSV cm/sEDV cm/sStenosisPlaque DescriptionComments +----------+--------+--------+--------+------------------+--------+ CCA Prox  47      11              heterogenous               +----------+--------+--------+--------+------------------+--------+ CCA Distal69      14              heterogenous               +----------+--------+--------+--------+------------------+--------+ ICA Prox  45      14      1-39%   heterogenous               +----------+--------+--------+--------+------------------+--------+ ICA Distal76      26                                         +----------+--------+--------+--------+------------------+--------+ ECA       104                                                +----------+--------+--------+--------+------------------+--------+ +----------+--------+--------+--------+-------------------+           PSV cm/sEDV cm/sDescribeArm Pressure (mmHG)  +----------+--------+--------+--------+-------------------+ Subclavian70                                          +----------+--------+--------+--------+-------------------+ +---------+--------+--+--------+--+---------+ VertebralPSV cm/s37EDV cm/s11Antegrade +---------+--------+--+--------+--+---------+   Summary: Right Carotid: Velocities in the right ICA are consistent with a 1-39% stenosis. Left Carotid: Velocities in the left ICA are consistent with a 1-39% stenosis. Vertebrals: Bilateral vertebral arteries demonstrate antegrade flow. *See table(s) above for measurements and observations.  Electronically signed by Antony Contras MD on 10/10/2020 at 12:17:14 PM.    Final    VAS [place] LOWER EXTREMITY VENOUS (DVT)  Result Date: 10/01/2020  Lower Venous DVT Study Indications: Edema.  Comparison Study: no prior Performing Technologist: Abram Sander RVS  Examination Guidelines: A complete evaluation includes B-mode imaging, spectral Doppler, color Doppler, and power Doppler as needed of all accessible portions of each vessel. Bilateral testing is considered an integral part of a complete examination. Limited examinations for reoccurring indications may be performed as noted. The reflux portion of the exam is performed with the patient in reverse Trendelenburg.  +---------+---------------+---------+-----------+----------+--------------+ RIGHT    CompressibilityPhasicitySpontaneityPropertiesThrombus Aging +---------+---------------+---------+-----------+----------+--------------+ CFV      Full           Yes      Yes                                 +---------+---------------+---------+-----------+----------+--------------+ SFJ      Full                                                        +---------+---------------+---------+-----------+----------+--------------+  FV Prox  Full                                                         +---------+---------------+---------+-----------+----------+--------------+ FV Mid   Full                                                        +---------+---------------+---------+-----------+----------+--------------+ FV DistalFull                                                        +---------+---------------+---------+-----------+----------+--------------+ PFV      Full                                                        +---------+---------------+---------+-----------+----------+--------------+ POP      Full           Yes      Yes                                 +---------+---------------+---------+-----------+----------+--------------+ PTV      Full                                                        +---------+---------------+---------+-----------+----------+--------------+ PERO     Full                                                        +---------+---------------+---------+-----------+----------+--------------+   +---------+---------------+---------+-----------+----------+--------------+ LEFT     CompressibilityPhasicitySpontaneityPropertiesThrombus Aging +---------+---------------+---------+-----------+----------+--------------+ CFV      Full           Yes      Yes                                 +---------+---------------+---------+-----------+----------+--------------+ SFJ      Full                                                        +---------+---------------+---------+-----------+----------+--------------+ FV Prox  Full                                                        +---------+---------------+---------+-----------+----------+--------------+  FV Mid   Full                                                        +---------+---------------+---------+-----------+----------+--------------+ FV DistalFull                                                         +---------+---------------+---------+-----------+----------+--------------+ PFV      Full                                                        +---------+---------------+---------+-----------+----------+--------------+ POP      Full           Yes      Yes                                 +---------+---------------+---------+-----------+----------+--------------+ PTV      Full                                                        +---------+---------------+---------+-----------+----------+--------------+ PERO     Full                                                        +---------+---------------+---------+-----------+----------+--------------+     Summary: BILATERAL: - No evidence of deep vein thrombosis seen in the lower extremities, bilaterally. - No evidence of superficial venous thrombosis in the lower extremities, bilaterally. -No evidence of popliteal cyst, bilaterally.   *See table(s) above for measurements and observations. Electronically signed by Deitra Mayo MD on 10/01/2020 at 2:08:23 PM.    Final    VAS [place] TRANSCRANIAL DOPPLER  Result Date: 10/10/2020  Transcranial Doppler with Bubble Indications: Stroke. Performing Technologist: Abram Sander RVS  Examination Guidelines: A complete evaluation includes B-mode imaging, spectral Doppler, color Doppler, and power Doppler as needed of all accessible portions of each vessel. Bilateral testing is considered an integral part of a complete examination. Limited examinations for reoccurring indications may be performed as noted.  +----------+-------------+----------+-----------+-------+ RIGHT TCD Right VM (cm)Depth (cm)PulsatilityComment +----------+-------------+----------+-----------+-------+ MCA           21.00                 1.28            +----------+-------------+----------+-----------+-------+ Term ICA      33.00                 1.10             +----------+-------------+----------+-----------+-------+ ICA siphon    27.00  1.11            +----------+-------------+----------+-----------+-------+  +----------+------------+----------+-----------+-------+ LEFT TCD  Left VM (cm)Depth (cm)PulsatilityComment +----------+------------+----------+-----------+-------+ MCA          25.00                 1.36            +----------+------------+----------+-----------+-------+ Term ICA     14.00                 1.27            +----------+------------+----------+-----------+-------+ PCA          14.00                 0.89            +----------+------------+----------+-----------+-------+ Opthalmic    24.00                 1.52            +----------+------------+----------+-----------+-------+ ICA siphon   28.00                 1.05            +----------+------------+----------+-----------+-------+  Summary:  Poor bitemporal and absent suboccipital window limit exam.Low normal mean flow velocities in majority of identified vessels of anterior circulation of unclear significance *See table(s) above for TCD measurements and observations.  Diagnosing physician: Antony Contras MD Electronically signed by Antony Contras MD on 10/10/2020 at 12:18:38 PM.    Final    IR THORACENTESIS ASP PLEURAL SPACE W/IMG GUIDE  Result Date: 10/02/2020 INDICATION: Patient with suspected lung cancer and recurrent left pleural effusions. Interventional radiology asked to perform a therapeutic thoracentesis. EXAM: ULTRASOUND GUIDED THORACENTESIS MEDICATIONS: 1% lidocaine 10 mL COMPLICATIONS: None immediate. PROCEDURE: An ultrasound guided thoracentesis was thoroughly discussed with the patient and questions answered. The benefits, risks, alternatives and complications were also discussed. The patient understands and wishes to proceed with the procedure. Written consent was obtained. Ultrasound was performed to localize and mark an  adequate pocket of fluid in the left chest. The area was then prepped and draped in the normal sterile fashion. 1% Lidocaine was used for local anesthesia. Under ultrasound guidance a 6 Fr Safe-T-Centesis catheter was introduced. Thoracentesis was performed. The catheter was removed and a dressing applied. FINDINGS: A total of approximately 600 mL of maroon-colored fluid was removed. IMPRESSION: Successful ultrasound guided left thoracentesis yielding 600 mL of pleural fluid. Read by: Soyla Dryer, NP Electronically Signed   By: Markus Daft M.D.   On: 10/02/2020 09:28   IR THORACENTESIS ASP PLEURAL SPACE W/IMG GUIDE  Result Date: 09/30/2020 INDICATION: Shortness of breath. Large left pleural effusion. Request for diagnostic therapeutic thoracentesis. EXAM: ULTRASOUND GUIDED LEFT THORACENTESIS MEDICATIONS: 1% plain lidocaine, 5 mL COMPLICATIONS: None immediate. PROCEDURE: An ultrasound guided thoracentesis was thoroughly discussed with the patient and questions answered. The benefits, risks, alternatives and complications were also discussed. The patient understands and wishes to proceed with the procedure. Written consent was obtained. Ultrasound was performed to localize and mark an adequate pocket of fluid in the left chest. The area was then prepped and draped in the normal sterile fashion. 1% Lidocaine was used for local anesthesia. Under ultrasound guidance a 6 Fr Safe-T-Centesis catheter was introduced. Thoracentesis was performed. The catheter was removed and a dressing applied. FINDINGS: A total of approximately 950 mL of dark, old bloody fluid was removed. Samples were sent to the laboratory as requested by  the clinical team. IMPRESSION: Successful ultrasound guided left thoracentesis yielding 950 mL of pleural fluid. Read by: Ascencion Dike PA-C Electronically Signed   By: Jerilynn Mages.  Shick M.D.   On: 09/30/2020 10:13    ASSESSMENT AND PLAN: 1.  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  Acute respiratory failure with hypoxia 3.  Malignant pleural effusion 4.  Acute systolic congestive heart failure 5.  Nonobstructive CAD 6.  Persistent sinus tachycardia 7.  Embolic stroke/shower emboli 8.  Leukocytosis 9.  Microcytic anemia 10.  AKI 11.  Atrial fibrillation  -The patient will meet with radiation oncology this evening.  They have indicated to me that they would like to proceed with palliative radiation to the lung mass if recommended by radiation oncology.  They are aware that the patient will need to transfer to [facility] long hospital anticipation of starting radiation. -We again reviewed systemic treatment options.  He is not candidate for systemic chemotherapy due to poor performance status.  Tissue has been sent for foundation 1 and PD-L1 testing to determine if he would be a candidate for targeted or immunotherapy.  This will take 2 to 3 weeks to result. -The patient indicates that they would like to have palliative care support in the home upon discharge.  Willing to transition to hospice if his performance status continues to decline or if he is not a candidate for targeted or immunotherapy.  They have expressed interest in residential hospice.  -Although the patient has an elevated ferritin level this is likely reactive.  His percent saturation iron levels are low.  Would recommend starting him on ferrous sulfate 325 mg twice daily.    LOS: 12 days   Mikey Bussing, DNP, AGPCNP-BC, AOCNP 10/11/20   Addendum  I have seen the patient, examined him. I agree with the assessment and and plan and have edited the notes.   I met with pt and his family again today for f/u.  I have discussed his case with radiation oncologist Dr. Tammi Klippel earlier today, and he will talk to patient and his family about his recommendation of a short course radiation next week. If pt and his family agree with RT, pt needs to be transferred to [facility] in next few days  to anticipate starting RT on Monday. Pt was confused and agitated last night, family would like to stay with him at night in hospital. We will f/u next week.   Truitt Merle  10/11/2020

## 2020-10-11 NOTE — Progress Notes (Addendum)
PROGRESS NOTE    Brett Medina  GGY:[id number] DOB: 1961-04-14 DOA: 09/29/2020 PCP: Patient, No Pcp Per     Brief Narrative:  Brett Green [name] a 59 y.o.WM PMHx Tobacco abuse.  Presented to the ED with complaints of difficulty breathing ongoing over the past 45months at least. Reports he was seen at urgent care several was given prednisone. Patient quit smoking cigarettes about 2 months ago. Has chronic unchanged bilateral lower extremity swelling. Reports a productive cough over the past few weeks. No family history of heart attacks. In the ED, O2 sats 91 to 97% on room air. Tachycardic to 135, mild tachypnea to 22.Lactic acid 1.3. WBC 13.2. Troponin 1087. UA with moderate hemoglobin. Chest CT-Constellation of findings consistent with metastatic malignancy, moderate to large volume left pleural effusion with almost complete collapse of left upper and lower lobes. Patient reports contrast allergy, he refused CT with contrast to rule out PE as he wastachycardic. Cardiology was consulted for elevated troponin, following. S/P left thoracentesis on 09/30/20 by IR 950 cc of dark bloody fluid removed. 2D echo 11/29 showing reduced LVEF 40-45% with global left ventricle hypokinesis.  B/L LE duplex US negative for DVT.  On 1130/21, chest x-ray showed recurrent large left pleural effusion, IR consulted for repeat left thoracentesis.  PCCM on board, recommend bronchoscopy, biopsies taken, await results.    Today, patient more awake, breathing still labored, still intermittently confused.  Wife at bedside, discussed again about further plans.  Patient denied any chest pain, palpitations, abdominal pain, nausea/vomiting, fever/chills.   Assessment & Plan:   Principal Problem:   Mass of left lung Active Problems:   Large pleural effusion   Elevated troponin   Demand ischemia (HCC)   Acute systolic heart failure (HCC)   Cerebral embolism with cerebral infarction   Acute respiratory failure  with hypoxia Large left pleural effusion s/p thoracentesis Non-small cell lung CA Multifactorial, likely 2/2 left pleural effusion, s/p left thoracentesis draining about 950 cc of fluid, exudative in nature PCCM on board, s/p EBUS/bronchoscopy Continue Xopenex Supplemental O2, I-S, flutter valve  Non-small cell lung CA Biopsy report showed above Oncology consulted, recommend rad onc consultation to evaluate if patient is a radiation candidate, also molecular testing and PD-L1 for possible ?  Immunotherapy  Acute metabolic encephalopathy Numerous acute and subacute embolic infarcts Vs ? 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% on 2D echo 11/29 LHC revealed moderate diffuse LAD stenosis and 40% D2 lesion, medical management LDL 73 Cardiology on board Continue aspirin, Lipitor, metoprolol, hold losartan due to low BP Daily weights, start strict I&O Monitor closely  Leukocytosis, likely reactive Afebrile Completed 7 days of IV ceftriaxone and azithromycin, currently not on antibiotics Continue to monitor fever curve and WBC  AKI Ongoing Daily BMP  Mild transaminitis Daily CMP Monitor closely while on Lipitor  ?UTI Enterococcus faecalis UC grew 1000 colony Completed antibiotics (12/4-->12/7)   Goals of care discussion Patient with very poor prognosis Extensive discussion with daughters on 10/10/2020 Palliative care consulted, switched patient to DNR Pain management, lorazepam as needed for  anxiety, mirtazapine for insomnia and also appetite  DVT prophylaxis: Lovenox Code Status: Full Family Communication: Discussed extensively with daughters at bedside on 10/10/2020  Status is: Inpatient    Dispo: The patient is from: Home              Anticipated d/c is to: Home with hospice              Anticipated d/c date is: TBD              Patient currently unstable      Consultants:  PCCM Cardiology IR Neurology Oncology    Procedures/Significant Events:  11/30  LEFT thoracentesis aspirated 950 cc fluid 11/30 PCXR;Increased size of large left pleural effusion with decreased left upper lung aeration. -Right perihilar/basilar opacities are grossly unchanged 12/1 LEFT thoracentesis; aspirated 61m maroon-colored fluid 12/3 LEFT heart cath;1) Pt with nonobstructive CAD  Moderate diffuse mid-LAD stenosis  Moderate diffuse diagonal stenosis  Patent LCx without stenosis  Patent RCA with mild aneurysmal dilatation of the mid-vessel and aneurysmal dilatation of the PLA branch 2) Normal LVEDP    Cultures 11/28 urine positive Enterococcus faecalis 11/29 LEFT thoracentesis NGTD    Antimicrobials: Anti-infectives (From admission, onward)   Start     Ordered Stop   10/05/20 1615  amoxicillin (AMOXIL) capsule 500 mg        10/05/20 1528     10/05/20 1445  cefTRIAXone (ROCEPHIN) 2 g in sodium chloride 0.9 % 100 mL IVPB  Status:  Discontinued        10/05/20 1354 10/05/20 1527   09/29/20 1630  cefTRIAXone (ROCEPHIN) 2 g in sodium chloride 0.9 % 100 mL IVPB  Status:  Discontinued        09/29/20 1624 09/29/20 2226   09/29/20 1630  azithromycin (ZITHROMAX) 500 mg in sodium chloride 0.9 % 250 mL IVPB  Status:  Discontinued        09/29/20 1624 09/29/20 2226         Continuous Infusions: . sodium chloride       Objective: Vitals:   10/11/20 0527 10/11/20 0900 10/11/20 1021 10/11/20 1218  BP: (!) 126/97 125/90 (!) 136/97 (!) 130/105  Pulse: (!)  106 (!) 114 (!) 117 (!) 106  Resp: _0 Temp: 98.7 F (37.1 C) 98 F (36.7 C) 98 F (36.7 C) 98 F (36.7 C)  TempSrc: Oral Oral Oral Oral  SpO2: 100% 97% 98% 96%  Weight:      Height:        Intake/Output Summary (Last 24 hours) at 10/11/2020 1525 Last data filed at 10/11/2020 1200 Gross per 24 hour  Intake 120 ml  Output 1 ml  Net 119 ml   Filed Weights   10/09/20 0357 10/10/20 0324 10/11/20 0319  Weight: 87.5 kg 86.7 kg 87.8 kg   Physical Exam:  General: NAD, confused  Cardiovascular: S1, S2 present  Respiratory:  Diminished breath sounds bilaterally  Abdomen: Soft, nontender, nondistended, bowel sounds present  Musculoskeletal: 1+ bilateral pedal edema noted  Skin: Normal  Psychiatry: Unable to assess   CBC: Recent Labs  Lab 10/07/20 0234 10/08/20 0121 10/09/20 0208 10/10/20 0133 10/11/20 0134  WBC 10.9* 12.9* 12.9* 12.7* 11.5*  NEUTROABS 8.7* 10.7* 10.9* 10.2* 9.1*  HGB 11.5* 11.5* 11.1* 11.0* 11.3*  HCT 37.2* 37.0* 36.2* 34.0* 35.3*  MCV 79.1* 79.1* 79.4* 78.7* 78.8*  PLT 255 244 183 168 1031  Basic Metabolic Panel: Recent Labs  Lab 10/06/20 0206 10/07/20 0234 10/08/20 0121 10/09/20 0208 10/10/20 0133 10/11/20  0134  NA 134* 132*  --  136 137 136  K 4.1 4.2  --  4.4 4.5 4.2  CL 101 99  --  101 102 102  CO2 24 24  --  $R'22 27 25  'jq$ GLUCOSE 119* 94  --  103* 110* 108*  BUN 21* 18  --  21* 24* 21*  CREATININE 1.26* 1.19  --  1.33* 1.52* 1.36*  CALCIUM 8.7* 8.5*  --  8.5* 8.6* 8.5*  MG 1.8 1.8 1.9 2.1 1.9 2.0  PHOS 4.1 3.5 3.2 3.8 3.3 3.7   GFR: Estimated Creatinine Clearance: 64.9 mL/min (A) (by C-G formula based on SCr of 1.36 mg/dL (H)). Liver Function Tests: Recent Labs  Lab 10/06/20 0206 10/07/20 0234 10/09/20 0208 10/10/20 0133 10/11/20 0134  AST 53* 40 44* 57* 53*  ALT 55* 49* 41 44 43  ALKPHOS 175* 224* 177* 175* 195*  BILITOT 0.6 0.8 1.3* 0.7 1.0  PROT 5.5* 5.1* 5.4* 5.1* 5.4*  ALBUMIN 2.1* 2.0* 2.0* 2.0* 2.1*    No results for input(s): LIPASE, AMYLASE in the last 168 hours. No results for input(s): AMMONIA in the last 168 hours. Coagulation Profile: No results for input(s): INR, PROTIME in the last 168 hours. Cardiac Enzymes: No results for input(s): CKTOTAL, CKMB, CKMBINDEX, TROPONINI in the last 168 hours. BNP (last 3 results) No results for input(s): PROBNP in the last 8760 hours. HbA1C: No results for input(s): HGBA1C in the last 72 hours. CBG: No results for input(s): GLUCAP in the last 168 hours. Lipid Profile: Recent Labs    10/10/20 1017  CHOL 130  HDL 35*  LDLCALC 73  TRIG 109  CHOLHDL 3.7   Thyroid Function Tests: No results for input(s): TSH, T4TOTAL, FREET4, T3FREE, THYROIDAB in the last 72 hours. Anemia Panel: Recent Labs    10/11/20 0134  VITAMINB12 1,059*  FOLATE 10.7  FERRITIN 772*  TIBC 228*  IRON 23*   Sepsis Labs: No results for input(s): PROCALCITON, LATICACIDVEN in the last 168 hours.  Recent Results (from the past 240 hour(s))  Surgical PCR screen     Status: None   Collection Time: 10/08/20  8:51 AM   Specimen: Nasal Mucosa; Nasal Swab  Result Value Ref Range Status   MRSA, PCR NEGATIVE NEGATIVE Final   Staphylococcus aureus NEGATIVE NEGATIVE Final    Comment: (NOTE) The Xpert SA Assay (FDA approved for NASAL specimens in patients 48 years of age and older), is one component of a comprehensive surveillance program. It is not intended to diagnose infection nor to guide or monitor treatment. Performed at [facility], [place] [address], [place], [place] [address]          Radiology Studies: MR BRAIN WO CONTRAST  Addendum Date: 10/09/2020   ADDENDUM REPORT: 10/09/2020 16:31 ADDENDUM: Study discussed by telephone with Dr. Lesia Sago on 10/09/2020 at 1620 hours. Electronically Signed   By: Genevie Ann M.D.   On: 10/09/2020 16:31   Result Date: 10/09/2020 CLINICAL DATA:  59 year old male with altered mental status, confusion.  Metastatic disease unknown primary, left lung biopsy results pending. EXAM: MRI HEAD WITHOUT CONTRAST TECHNIQUE: Multiplanar, multiecho pulse sequences of the brain and surrounding structures were obtained without intravenous contrast. COMPARISON:  Head CT 10/02/2020. CT Chest, Abdomen, and Pelvis 10/02/2020. FINDINGS: Brain: Widespread abnormal restricted diffusion throughout the brain. Lesions range from punctate to patchy, confluent areas. Superior frontal and parietal lobes among those most severely affected (series 5, image 93). Largest diffusion restricted lesion is  in the left occipital pole, 3 cm (series 5, image 79). Bilateral cerebellar involvement greater on the left (2.4 cm). Left basal ganglia affected. Other deep gray nuclei and brainstem appear spared. On T2 and FLAIR imaging these areas appear is gyriform and subcortical hyperintense signal in keeping with cytotoxic edema. Some areas demonstrate central cystic change and appear more subacute (series 5, image 90). Occasional rounded areas of signal abnormality are noted (left cerebellum series 11, image 5). SWI demonstrates microhemorrhage at many of the affected sites. And also chronic superimposed microhemorrhage such as at the left dorsal thalamus on series 14 image 23. No malignant hemorrhagic transformation. No extra-axial or intraventricular blood. No ventriculomegaly. No intracranial mass effect or midline shift. Normal basilar cisterns. Cervicomedullary junction and pituitary are within normal limits. Vascular: Major intracranial vascular flow voids are preserved. Generalized intracranial artery tortuosity. Skull and upper cervical spine: Visualized bone marrow signal is within normal limits. Partially visible cervical spine degeneration. Sinuses/Orbits: Negative. Other: Mastoids are clear. Grossly normal visible internal auditory structures. Visible scalp and face soft tissues appear negative. IMPRESSION: 1. Widely scattered abnormal signal  throughout the brain most compatible with numerous acute and subacute embolic infarcts. Many of the areas demonstrate associated petechial hemorrhage - raising the possibility of septic emboli which are prone to bleeding - but there is no malignant hemorrhagic transformation or significant intracranial mass effect. 2. No strong evidence of metastatic disease to the brain on this non-contrast exam. Note that some of the above infarcts will likely enhance on subsequent MRI without and with contrast, which will confound staging for metastases to a degree. Electronically Signed: By: Genevie Ann M.D. 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 Noncontrast MRI yesterday revealing evidence of extensive embolic infarcts. Postcontrast images now in hopes of better evaluating the possibility of metastatic disease. EXAM: MRI HEAD WITH CONTRAST TECHNIQUE: Multiplanar, multiecho pulse sequences of the brain and surrounding structures were obtained with intravenous contrast. CONTRAST:  25mL GADAVIST GADOBUTROL 1 MMOL/ML IV SOLN COMPARISON:  Noncontrast brain MRI yesterday. FINDINGS: Postcontrast coronal T2 and 3 plane T1 imaging. Scattered small, indistinct, and frequently gyriform areas of bilateral cerebral hemisphere enhancement. Multiple enhancing foci annotated on series 4. On correlation with DWI yesterday, virtually all of the areas correspond to foci of abnormal diffusion. Furthermore, there is no abnormal enhancement identified in the areas of DWI sparing yesterday, such as the right basal ganglia, bilateral thalami, brainstem, and much of the right cerebellum. No dural thickening or enhancement identified. No intracranial mass effect, ventriculomegaly. Compared to noncontrast T1 imaging yesterday the major  dural venous sinuses are enhancing and appear to be patent. IMPRESSION: 1. Post ischemic enhancement: numerous scattered small enhancing foci which are frequently gyriform and indistinct, and correspond to areas of abnormal DWI yesterday. 2. No convincing cerebral metastatic disease at this time. Electronically Signed   By: Genevie Ann M.D.   On: 10/10/2020 19:26   DG CHEST PORT 1 VIEW  Result Date: 10/10/2020 CLINICAL DATA:  Tachycardia, shortness of breath EXAM: PORTABLE CHEST 1 VIEW COMPARISON:  10/08/2020 FINDINGS: Continued complete opacification of the left hemithorax, unchanged. Right basilar patchy airspace opacity again noted, unchanged. No visible effusion on the right. No acute bony abnormality. IMPRESSION: No significant change since prior study. Electronically Signed   By: Rolm Baptise M.D.  On: 10/10/2020 12:27   VAS US CAROTID  Result Date: 10/10/2020 Carotid Arterial Duplex Study Indications:       CVA. Limitations        Today's exam was limited due to the patient's respiratory                    variation. Comparison Study:  no prior Performing Technologist: Abram Sander RVS  Examination Guidelines: A complete evaluation includes B-mode imaging, spectral Doppler, color Doppler, and power Doppler as needed of all accessible portions of each vessel. Bilateral testing is considered an integral part of a complete examination. Limited examinations for reoccurring indications may be performed as noted.  Right Carotid Findings: +----------+--------+--------+--------+------------------+--------+           PSV cm/sEDV cm/sStenosisPlaque DescriptionComments +----------+--------+--------+--------+------------------+--------+ CCA Prox  66      7               heterogenous               +----------+--------+--------+--------+------------------+--------+ CCA Distal73      14              heterogenous               +----------+--------+--------+--------+------------------+--------+ ICA  Prox  36      9       1-39%   heterogenous               +----------+--------+--------+--------+------------------+--------+ ICA Distal37      17                                         +----------+--------+--------+--------+------------------+--------+ ECA       87      9                                          +----------+--------+--------+--------+------------------+--------+ +----------+--------+-------+--------+-------------------+           PSV cm/sEDV cmsDescribeArm Pressure (mmHG) +----------+--------+-------+--------+-------------------+ [id number]                                         +----------+--------+-------+--------+-------------------+ +---------+--------+--+--------+--+---------+ VertebralPSV cm/s73EDV cm/s19Antegrade +---------+--------+--+--------+--+---------+  Left Carotid Findings: +----------+--------+--------+--------+------------------+--------+           PSV cm/sEDV cm/sStenosisPlaque DescriptionComments +----------+--------+--------+--------+------------------+--------+ CCA Prox  47      11              heterogenous               +----------+--------+--------+--------+------------------+--------+ CCA Distal69      14              heterogenous               +----------+--------+--------+--------+------------------+--------+ ICA Prox  45      14      1-39%   heterogenous               +----------+--------+--------+--------+------------------+--------+ ICA Distal76      26                                         +----------+--------+--------+--------+------------------+--------+ ECA  104                                                +----------+--------+--------+--------+------------------+--------+ +----------+--------+--------+--------+-------------------+           PSV cm/sEDV cm/sDescribeArm Pressure (mmHG) +----------+--------+--------+--------+-------------------+ Subclavian70                                           +----------+--------+--------+--------+-------------------+ +---------+--------+--+--------+--+---------+ VertebralPSV cm/s37EDV cm/s11Antegrade +---------+--------+--+--------+--+---------+   Summary: Right Carotid: Velocities in the right ICA are consistent with a 1-39% stenosis. Left Carotid: Velocities in the left ICA are consistent with a 1-39% stenosis. Vertebrals: Bilateral vertebral arteries demonstrate antegrade flow. *See table(s) above for measurements and observations.  Electronically signed by Antony Contras MD on 10/10/2020 at 12:17:14 PM.    Final    VAS [place] TRANSCRANIAL DOPPLER  Result Date: 10/10/2020  Transcranial Doppler with Bubble Indications: Stroke. Performing Technologist: Abram Sander RVS  Examination Guidelines: A complete evaluation includes B-mode imaging, spectral Doppler, color Doppler, and power Doppler as needed of all accessible portions of each vessel. Bilateral testing is considered an integral part of a complete examination. Limited examinations for reoccurring indications may be performed as noted.  +----------+-------------+----------+-----------+-------+ RIGHT TCD Right VM (cm)Depth (cm)PulsatilityComment +----------+-------------+----------+-----------+-------+ MCA           21.00                 1.28            +----------+-------------+----------+-----------+-------+ Term ICA      33.00                 1.10            +----------+-------------+----------+-----------+-------+ ICA siphon    27.00                 1.11            +----------+-------------+----------+-----------+-------+  +----------+------------+----------+-----------+-------+ LEFT TCD  Left VM (cm)Depth (cm)PulsatilityComment +----------+------------+----------+-----------+-------+ MCA          25.00                 1.36            +----------+------------+----------+-----------+-------+ Term ICA     14.00                 1.27             +----------+------------+----------+-----------+-------+ PCA          14.00                 0.89            +----------+------------+----------+-----------+-------+ Opthalmic    24.00                 1.52            +----------+------------+----------+-----------+-------+ ICA siphon   28.00                 1.05            +----------+------------+----------+-----------+-------+  Summary:  Poor bitemporal and absent suboccipital window limit exam.Low normal mean flow velocities in majority of identified vessels of anterior circulation of unclear significance *See table(s) above for TCD measurements and observations.  Diagnosing physician: Antony Contras MD  Electronically signed by Antony Contras MD on 10/10/2020 at 12:18:38 PM.    Final         Scheduled Meds: . aspirin EC  81 mg Oral Daily  . atorvastatin  40 mg Oral Daily  . digoxin  0.25 mg Oral Daily  . enoxaparin (LOVENOX) injection  40 mg Subcutaneous Q24H  . mouth rinse  15 mL Mouth Rinse BID  . metoprolol tartrate  50 mg Oral BID  . mirtazapine  15 mg Oral QHS  . mupirocin ointment  1 application Nasal BID  . sodium chloride flush  3 mL Intravenous Q12H  . sodium chloride flush  3 mL Intravenous Q12H   Continuous Infusions: . sodium chloride       LOS: 12 days        Alma Friendly, MD [facility]   If 7PM-7AM, please contact night-coverage 10/11/2020, 3:25 PM

## 2020-10-11 NOTE — Plan of Care (Signed)

## 2020-10-11 NOTE — Progress Notes (Signed)
PT Cancellation Note  Patient Details Name: Brett Medina MRN: [id number] DOB: 1961/06/26   Cancelled Treatment:    Reason Eval/Treat Not Completed: Other (comment). Patient having family meeting to determine wishes/needs going forward. Will re-attempt if appropriate.    Bristol Osentoski 10/11/2020, 1:56 PM

## 2020-10-11 NOTE — Progress Notes (Signed)
Progress Note  Patient Name: Brett Medina Date of Encounter: 10/11/2020  [facility] Cardiologist: Elouise Munroe, MD   Subjective   Feeling OK.  Denies palpitations.  Breathing is a little better but still difficult.  Inpatient Medications    Scheduled Meds: . aspirin EC  81 mg Oral Daily  . atorvastatin  40 mg Oral Daily  . enoxaparin (LOVENOX) injection  40 mg Subcutaneous Q24H  . mouth rinse  15 mL Mouth Rinse BID  . metoprolol tartrate  25 mg Oral BID  . mupirocin ointment  1 application Nasal BID  . sodium chloride flush  3 mL Intravenous Q12H  . sodium chloride flush  3 mL Intravenous Q12H   Continuous Infusions: . sodium chloride     PRN Meds: sodium chloride, acetaminophen **OR** acetaminophen, guaiFENesin-codeine, levalbuterol, loperamide, metoprolol tartrate, ondansetron **OR** ondansetron (ZOFRAN) IV, polyethylene glycol, sodium chloride flush   Vital Signs    Vitals:   10/11/20 0232 10/11/20 0319 10/11/20 0527 10/11/20 0900  BP: 135/87 (!) 138/95 (!) 126/97 125/90  Pulse: (!) 126 (!) 108 (!) 106 (!) 114  Resp: 20 (!) 24 20 20   Temp: 98.1 F (36.7 C) 98.8 F (37.1 C) 98.7 F (37.1 C) 98 F (36.7 C)  TempSrc: Oral Oral Oral Oral  SpO2: 98% 99% 100% 97%  Weight:  87.8 kg    Height:        Intake/Output Summary (Last 24 hours) at 10/11/2020 1121 Last data filed at 10/10/2020 1503 Gross per 24 hour  Intake 250.91 ml  Output 300 ml  Net -49.09 ml   Last 3 Weights 10/11/2020 10/10/2020 10/09/2020  Weight (lbs) 193 lb 9 oz 191 lb 2.2 oz 192 lb 14.4 oz  Weight (kg) 87.8 kg 86.7 kg 87.5 kg      Telemetry    MAT, atrial flutter  Short NSVT vs afib with aberrancy.  - Personally Reviewed  ECG    n/a - Personally Reviewed  Physical Exam   VS:  BP 125/90 (BP Location: Right Arm)   Pulse (!) 114   Temp 98 F (36.7 C) (Oral)   Resp 20   Ht 5\' 9"  (1.753 m)   Wt 87.8 kg   SpO2 97%   BMI 28.58 kg/m  , BMI Body mass index is 28.58  kg/m. GENERAL:  Ill-appearing.  Diaphoretic. HEENT: Pupils equal round and reactive, fundi not visualized, oral mucosa unremarkable NECK:  No jugular venous distention, waveform within normal limits, carotid upstroke brisk and symmetric, no bruits LUNGS: Diminished on the L base.  Rhonchi on L HEART:  Tachycardic.  Irregularly irregular.  PMI not displaced or sustained,S1 and S2 within normal limits, no S3, no S4, no clicks, no rubs, no murmurs ABD:  Flat, positive bowel sounds normal in frequency in pitch, no bruits, no rebound, no guarding, no midline pulsatile mass, no hepatomegaly, no splenomegaly EXT:  2 plus pulses throughout, no edema, no cyanosis no clubbing SKIN:  No rashes no nodules.  Mottled LEs  NEURO:  Cranial nerves II through XII grossly intact, motor grossly intact throughout PSYCH:  Cognitively intact, oriented to person place and time   Labs    High Sensitivity Troponin:   Recent Labs  Lab 09/29/20 1635 09/29/20 2032 09/30/20 0213 10/01/20 0917  TROPONINIHS 1,087* 1,380* 1,446* 746*      Chemistry Recent Labs  Lab 10/09/20 0208 10/10/20 0133 10/11/20 0134  NA 136 137 136  K 4.4 4.5 4.2  CL 101 102 102  CO2 22 27 25   GLUCOSE 103* 110* 108*  BUN 21* 24* 21*  CREATININE 1.33* 1.52* 1.36*  CALCIUM 8.5* 8.6* 8.5*  PROT 5.4* 5.1* 5.4*  ALBUMIN 2.0* 2.0* 2.1*  AST 44* 57* 53*  ALT 41 44 43  ALKPHOS 177* 175* 195*  BILITOT 1.3* 0.7 1.0  GFRNONAA >60 53* >60  ANIONGAP 13 8 9      Hematology Recent Labs  Lab 10/09/20 0208 10/10/20 0133 10/11/20 0134  WBC 12.9* 12.7* 11.5*  RBC 4.56 4.32 4.48  HGB 11.1* 11.0* 11.3*  HCT 36.2* 34.0* 35.3*  MCV 79.4* 78.7* 78.8*  MCH 24.3* 25.5* 25.2*  MCHC 30.7 32.4 32.0  RDW 17.0* 17.1* 17.1*  PLT 183 168 158    BNPNo results for input(s): BNP, PROBNP in the last 168 hours.   DDimer No results for input(s): DDIMER in the last 168 hours.   Radiology    MR BRAIN WO CONTRAST  Addendum Date: 10/09/2020    ADDENDUM REPORT: 10/09/2020 16:31 ADDENDUM: Study discussed by telephone with Dr. Lesia Sago on 10/09/2020 at 1620 hours. Electronically Signed   By: Genevie Ann M.D.   On: 10/09/2020 16:31   Result Date: 10/09/2020 CLINICAL DATA:  59 year old male with altered mental status, confusion. Metastatic disease unknown primary, left lung biopsy results pending. EXAM: MRI HEAD WITHOUT CONTRAST TECHNIQUE: Multiplanar, multiecho pulse sequences of the brain and surrounding structures were obtained without intravenous contrast. COMPARISON:  Head CT 10/02/2020. CT Chest, Abdomen, and Pelvis 10/02/2020. FINDINGS: Brain: Widespread abnormal restricted diffusion throughout the brain. Lesions range from punctate to patchy, confluent areas. Superior frontal and parietal lobes among those most severely affected (series 5, image 93). Largest diffusion restricted lesion is in the left occipital pole, 3 cm (series 5, image 79). Bilateral cerebellar involvement greater on the left (2.4 cm). Left basal ganglia affected. Other deep gray nuclei and brainstem appear spared. On T2 and FLAIR imaging these areas appear is gyriform and subcortical hyperintense signal in keeping with cytotoxic edema. Some areas demonstrate central cystic change and appear more subacute (series 5, image 90). Occasional rounded areas of signal abnormality are noted (left cerebellum series 11, image 5). SWI demonstrates microhemorrhage at many of the affected sites. And also chronic superimposed microhemorrhage such as at the left dorsal thalamus on series 14 image 23. No malignant hemorrhagic transformation. No extra-axial or intraventricular blood. No ventriculomegaly. No intracranial mass effect or midline shift. Normal basilar cisterns. Cervicomedullary junction and pituitary are within normal limits. Vascular: Major intracranial vascular flow voids are preserved. Generalized intracranial artery tortuosity. Skull and upper cervical spine: Visualized bone  marrow signal is within normal limits. Partially visible cervical spine degeneration. Sinuses/Orbits: Negative. Other: Mastoids are clear. Grossly normal visible internal auditory structures. Visible scalp and face soft tissues appear negative. IMPRESSION: 1. 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 Many of the areas demonstrate associated petechial hemorrhage - raising the possibility of septic emboli which are prone to bleeding - but there is no malignant hemorrhagic transformation or significant intracranial mass effect. 2. No strong evidence of metastatic disease to the brain on this non-contrast exam. Note that some of the above infarcts will likely enhance on subsequent MRI without and with contrast, which will confound staging for metastases to a degree. Electronically Signed: By: Genevie Ann M.D. On: 10/09/2020 16:10   MR BRAIN W CONTRAST  Result Date: 10/10/2020 CLINICAL DATA:  59 year old male with recent diagnosis of metastatic disease, endobronchial  left upper lobe biopsy positive for non-small cell carcinoma with markers suggesting lung primary. Noncontrast MRI yesterday revealing evidence of extensive embolic infarcts. Postcontrast images now in hopes of better evaluating the possibility of metastatic disease. EXAM: MRI HEAD WITH CONTRAST TECHNIQUE: Multiplanar, multiecho pulse sequences of the brain and surrounding structures were obtained with intravenous contrast. CONTRAST:  32mL GADAVIST GADOBUTROL 1 MMOL/ML IV SOLN COMPARISON:  Noncontrast brain MRI yesterday. FINDINGS: Postcontrast coronal T2 and 3 plane T1 imaging. Scattered small, indistinct, and frequently gyriform areas of bilateral cerebral hemisphere enhancement. Multiple enhancing foci annotated on series 4. On correlation with DWI yesterday, virtually all of the areas correspond to foci of abnormal diffusion. Furthermore, there is no abnormal enhancement  identified in the areas of DWI sparing yesterday, such as the right basal ganglia, bilateral thalami, brainstem, and much of the right cerebellum. No dural thickening or enhancement identified. No intracranial mass effect, ventriculomegaly. Compared to noncontrast T1 imaging yesterday the major dural venous sinuses are enhancing and appear to be patent. IMPRESSION: 1. Post ischemic enhancement: numerous scattered small enhancing foci which are frequently gyriform and indistinct, and correspond to areas of abnormal DWI yesterday. 2. No convincing cerebral metastatic disease at this time. Electronically Signed   By: Genevie Ann M.D.   On: 10/10/2020 19:26   DG CHEST PORT 1 VIEW  Result Date: 10/10/2020 CLINICAL DATA:  Tachycardia, shortness of breath EXAM: PORTABLE CHEST 1 VIEW COMPARISON:  10/08/2020 FINDINGS: Continued complete opacification of the left hemithorax, unchanged. Right basilar patchy airspace opacity again noted, unchanged. No visible effusion on the right. No acute bony abnormality. IMPRESSION: No significant change since prior study. Electronically Signed   By: Rolm Baptise M.D.   On: 10/10/2020 12:27   VAS US CAROTID  Result Date: 10/10/2020 Carotid Arterial Duplex Study Indications:       CVA. Limitations        Today's exam was limited due to the patient's respiratory                    variation. Comparison Study:  no prior Performing Technologist: Abram Sander RVS  Examination Guidelines: A complete evaluation includes B-mode imaging, spectral Doppler, color Doppler, and power Doppler as needed of all accessible portions of each vessel. Bilateral testing is considered an integral part of a complete examination. Limited examinations for reoccurring indications may be performed as noted.  Right Carotid Findings: +----------+--------+--------+--------+------------------+--------+           PSV cm/sEDV cm/sStenosisPlaque DescriptionComments  +----------+--------+--------+--------+------------------+--------+ CCA Prox  66      7               heterogenous               +----------+--------+--------+--------+------------------+--------+ CCA Distal73      14              heterogenous               +----------+--------+--------+--------+------------------+--------+ ICA Prox  36      9       1-39%   heterogenous               +----------+--------+--------+--------+------------------+--------+ ICA Distal37      17                                         +----------+--------+--------+--------+------------------+--------+ ECA       87  9                                          +----------+--------+--------+--------+------------------+--------+ +----------+--------+-------+--------+-------------------+           PSV cm/sEDV cmsDescribeArm Pressure (mmHG) +----------+--------+-------+--------+-------------------+ [id number]                                         +----------+--------+-------+--------+-------------------+ +---------+--------+--+--------+--+---------+ VertebralPSV cm/s73EDV cm/s19Antegrade +---------+--------+--+--------+--+---------+  Left Carotid Findings: +----------+--------+--------+--------+------------------+--------+           PSV cm/sEDV cm/sStenosisPlaque DescriptionComments +----------+--------+--------+--------+------------------+--------+ CCA Prox  47      11              heterogenous               +----------+--------+--------+--------+------------------+--------+ CCA Distal69      14              heterogenous               +----------+--------+--------+--------+------------------+--------+ ICA Prox  45      14      1-39%   heterogenous               +----------+--------+--------+--------+------------------+--------+ ICA Distal76      26                                          +----------+--------+--------+--------+------------------+--------+ ECA       104                                                +----------+--------+--------+--------+------------------+--------+ +----------+--------+--------+--------+-------------------+           PSV cm/sEDV cm/sDescribeArm Pressure (mmHG) +----------+--------+--------+--------+-------------------+ Subclavian70                                          +----------+--------+--------+--------+-------------------+ +---------+--------+--+--------+--+---------+ VertebralPSV cm/s37EDV cm/s11Antegrade +---------+--------+--+--------+--+---------+   Summary: Right Carotid: Velocities in the right ICA are consistent with a 1-39% stenosis. Left Carotid: Velocities in the left ICA are consistent with a 1-39% stenosis. Vertebrals: Bilateral vertebral arteries demonstrate antegrade flow. *See table(s) above for measurements and observations.  Electronically signed by Antony Contras MD on 10/10/2020 at 12:17:14 PM.    Final    VAS [place] TRANSCRANIAL DOPPLER  Result Date: 10/10/2020  Transcranial Doppler with Bubble Indications: Stroke. Performing Technologist: Abram Sander RVS  Examination Guidelines: A complete evaluation includes B-mode imaging, spectral Doppler, color Doppler, and power Doppler as needed of all accessible portions of each vessel. Bilateral testing is considered an integral part of a complete examination. Limited examinations for reoccurring indications may be performed as noted.  +----------+-------------+----------+-----------+-------+ RIGHT TCD Right VM (cm)Depth (cm)PulsatilityComment +----------+-------------+----------+-----------+-------+ MCA           21.00                 1.28            +----------+-------------+----------+-----------+-------+ Term ICA      33.00  1.10            +----------+-------------+----------+-----------+-------+ ICA siphon    27.00                  1.11            +----------+-------------+----------+-----------+-------+  +----------+------------+----------+-----------+-------+ LEFT TCD  Left VM (cm)Depth (cm)PulsatilityComment +----------+------------+----------+-----------+-------+ MCA          25.00                 1.36            +----------+------------+----------+-----------+-------+ Term ICA     14.00                 1.27            +----------+------------+----------+-----------+-------+ PCA          14.00                 0.89            +----------+------------+----------+-----------+-------+ Opthalmic    24.00                 1.52            +----------+------------+----------+-----------+-------+ ICA siphon   28.00                 1.05            +----------+------------+----------+-----------+-------+  Summary:  Poor bitemporal and absent suboccipital window limit exam.Low normal mean flow velocities in majority of identified vessels of anterior circulation of unclear significance *See table(s) above for TCD measurements and observations.  Diagnosing physician: Antony Contras MD Electronically signed by Antony Contras MD on 10/10/2020 at 12:18:38 PM.    Final     Cardiac Studies   LHC 10/04/20:   Dist LAD lesion is 50% stenosed.  1st RPL lesion is 20% stenosed.  Mid RCA lesion is 25% stenosed.  2nd Diag lesion is 40% stenosed.  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 No severe lesions or culprit for NSTEMI - suspect demand ischemia  Echo 09/30/20: 1. Left ventricular ejection fraction, by estimation, is 40 to 45%. The  left ventricle has mildly decreased function. The left ventricle  demonstrates global hypokinesis. Left ventricular diastolic  parameters are  indeterminate.  2. Right ventricular systolic function is normal. The right ventricular  size is normal. Tricuspid regurgitation signal is inadequate for assessing  PA pressure.  3. The mitral valve is normal in structure. No evidence of mitral valve  regurgitation. No evidence of mitral stenosis.  4. The aortic valve was not well visualized. Aortic valve regurgitation  is trivial. No aortic stenosis is present.  5. Aortic dilatation noted. 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  6. The inferior vena cava is dilated in size with <50% respiratory  variability, suggesting right atrial pressure of 15 mmHg.   Patient Profile     59 y.o. male with hypertension, tobacco abuse, and limited medical care admitted with exertional dyspnea and found to have hypoxic respiratory failure, acute systolic heart failure and metastatic lung cancer.   Assessment & Plan    # MAT:  # Atrial flutter:  Monitor shows mostly MAT, sinus tachycardia, and some atrial flutter.  Not  a candidate for anticoagulation for now due to acute/subacute embolic stroke and malignant pleural effusion. Appreciate neurology recommendations.  Given his embolic event and concern for thrombus, will avoid antiarrhythmics. Heart rates responded to digoxin which was added due to low BP.  Will increase metoprolol to 50mg  bid given that his BP has improved.  Start digoxin 0.25 mg daily and check a level in 3 days.    # Embolic stroke:  Patient confused and tachycardic.  Confusion is better today. MRI showed numerous acute and subacute embolic emboli.  No evidence of brain metastasis.  Appreciate neurology following.  No TEE given that he is transitioning to palliation and not an anticoagulation candidate.   # Acute systolic heart failure:  LVEF 40-45%.  Newly diagnosed this admission.  He is euvolemic on exam.  Nonobstructive disease on cath.  Increase metoprolol as above and consolidate to  succinate when able.  Holding losartan due to tachycardia and hypotension.  # Non-obstructive CAD:  Cath revealed moderate diffuse LAD stenosis and 40% D2 lesion.  Manage medically.  Continue atorvastatin.  He does have mild transaminitis.  This will need to be monitored over time.  Continue aspirin and beta blocker as above. Marland Kitchen  #Anemia: Hemoglobin stable at 11.5.  # AKI: Stop losartan and avoid nephrotoxins.  # Hypoxia: # Likely malignancy:  Per primary team.  Breathing improved with thoracentesis.  Planning for palliative XRT vs hospice per oncology.      For questions or updates, please contact [facility] Please consult [email or web address] for contact info under        Signed, Skeet Latch, MD  10/11/2020, 11:21 AM

## 2020-10-11 NOTE — Progress Notes (Signed)
This chaplain responded to PMT consult for Pt. and family spiritual care. The Pt responds to the call of his name and is alert and responsive during the short time with the chaplain. The Pt. Brett Medina is bedside.  The chaplain introduced herself and inquired about the Pt. morning.  The Pt. replied "I am frustrated." The chaplain offered a listening presence and hears the Pt. state "I do not have a family advocate." The chaplain understands the Pt. statements to mean he thinks the family is making decisions without him.  At this time the Pt. states, "I don't want to talk anymore."  The Pt. wife is quietly tearful and shares, "He wants to go home."  The chaplain offers spiritual care with [name]. [name] declined at this time and shared an interest in another time.  This chaplain will gently F/U with spiritual care.

## 2020-10-12 LAB — COMPREHENSIVE METABOLIC PANEL
ALT: 41 U/L (ref 0–44)
AST: 59 U/L — ABNORMAL HIGH (ref 15–41)
Albumin: 2.2 g/dL — ABNORMAL LOW (ref 3.5–5.0)
Alkaline Phosphatase: 216 U/L — ABNORMAL HIGH (ref 38–126)
Anion gap: 11 (ref 5–15)
BUN: 20 mg/dL (ref 6–20)
CO2: 22 mmol/L (ref 22–32)
Calcium: 8.5 mg/dL — ABNORMAL LOW (ref 8.9–10.3)
Chloride: 103 mmol/L (ref 98–111)
Creatinine, Ser: 1.35 mg/dL — ABNORMAL HIGH (ref 0.61–1.24)
GFR, Estimated: >60 mL/min (ref >60)
Glucose, Bld: 84 mg/dL (ref 70–99)
Potassium: 4.5 mmol/L (ref 3.5–5.1)
Sodium: 136 mmol/L (ref 135–145)
Total Bilirubin: 0.8 mg/dL (ref 0.3–1.2)
Total Protein: 5.5 g/dL — ABNORMAL LOW (ref 6.5–8.1)

## 2020-10-12 LAB — CBC WITH DIFFERENTIAL/PLATELET
Abs Immature Granulocytes: 0.2 10*3/uL — ABNORMAL HIGH (ref 0.00–0.07)
Basophils Absolute: 0 10*3/uL (ref 0.0–0.1)
Basophils Relative: 0 %
Eosinophils Absolute: 0 10*3/uL (ref 0.0–0.5)
Eosinophils Relative: 0 %
HCT: 38.6 % — ABNORMAL LOW (ref 39.0–52.0)
Hemoglobin: 12 g/dL — ABNORMAL LOW (ref 13.0–17.0)
Immature Granulocytes: 2 %
Lymphocytes Relative: 7 %
Lymphs Abs: 0.8 10*3/uL (ref 0.7–4.0)
MCH: 24.8 pg — ABNORMAL LOW (ref 26.0–34.0)
MCHC: 31.1 g/dL (ref 30.0–36.0)
MCV: 79.8 fL — ABNORMAL LOW (ref 80.0–100.0)
Monocytes Absolute: 1.3 10*3/uL — ABNORMAL HIGH (ref 0.1–1.0)
Monocytes Relative: 12 %
Neutro Abs: 9.1 10*3/uL — ABNORMAL HIGH (ref 1.7–7.7)
Neutrophils Relative %: 79 %
Platelets: 151 10*3/uL (ref 150–400)
RBC: 4.84 MIL/uL (ref 4.22–5.81)
RDW: 16.9 % — ABNORMAL HIGH (ref 11.5–15.5)
WBC: 11.5 10*3/uL — ABNORMAL HIGH (ref 4.0–10.5)
nRBC: 0 % (ref 0.0–0.2)

## 2020-10-12 LAB — PHOSPHORUS: Phosphorus: 3.7 mg/dL (ref 2.5–4.6)

## 2020-10-12 LAB — MAGNESIUM: Magnesium: 2 mg/dL (ref 1.7–2.4)

## 2020-10-12 MED ORDER — METOPROLOL TARTRATE 25 MG PO TABS
25.0000 mg | ORAL_TABLET | Freq: Once | ORAL | Status: AC
  Administered 2020-10-12: 17:00:00 25 mg via ORAL
  Filled 2020-10-12: qty 1

## 2020-10-12 MED ORDER — METOPROLOL TARTRATE 50 MG PO TABS
100.0000 mg | ORAL_TABLET | Freq: Two times a day (BID) | ORAL | Status: DC
  Administered 2020-10-12 – 2020-10-16 (×8): 100 mg via ORAL
  Filled 2020-10-12: qty 1
  Filled 2020-10-12: qty 2
  Filled 2020-10-12: qty 1
  Filled 2020-10-12 (×2): qty 2
  Filled 2020-10-12: qty 1
  Filled 2020-10-12: qty 2
  Filled 2020-10-12: qty 1

## 2020-10-12 MED ORDER — METOPROLOL TARTRATE 25 MG PO TABS: 75.0000 mg | ORAL_TABLET | Freq: Two times a day (BID) | ORAL | Status: DC

## 2020-10-12 MED ORDER — GUAIFENESIN-CODEINE 100-10 MG/5ML PO SOLN
10.0000 mL | Freq: Every day | ORAL | Status: DC
  Administered 2020-10-12 – 2020-10-16 (×5): 10 mL via ORAL
  Filled 2020-10-12 (×7): qty 10

## 2020-10-12 MED ORDER — METOPROLOL TARTRATE 25 MG PO TABS
25.0000 mg | ORAL_TABLET | Freq: Once | ORAL | Status: AC
  Administered 2020-10-12: 10:00:00 25 mg via ORAL
  Filled 2020-10-12: qty 1

## 2020-10-12 NOTE — Progress Notes (Signed)
OT Cancellation Note  Patient Details Name: VERGIL BURBY MRN: [id number] DOB: 02/15/1961   Cancelled Treatment:    Reason Eval/Treat Not Completed: Patient declined, comfortable in bed and wanting to eat lunch.    Ramell Wacha D Garrus Gauthreaux 10/12/2020, 12:33 PM

## 2020-10-12 NOTE — Progress Notes (Signed)
PROGRESS NOTE    Brett Medina  QPR:[id number] DOB: 04-08-1961 DOA: 09/29/2020 PCP: Patient, No Pcp Per     Brief Narrative:  Brett Green [name] a 59 y.o.WM PMHx Tobacco abuse.  Presented to the ED with complaints of difficulty breathing ongoing over the past 28months at least. Reports he was seen at urgent care several was given prednisone. Patient quit smoking cigarettes about 2 months ago. Has chronic unchanged bilateral lower extremity swelling. Reports a productive cough over the past few weeks. No family history of heart attacks. In the ED, O2 sats 91 to 97% on room air. Tachycardic to 135, mild tachypnea to 22.Lactic acid 1.3. WBC 13.2. Troponin 1087. UA with moderate hemoglobin. Chest CT-Constellation of findings consistent with metastatic malignancy, moderate to large volume left pleural effusion with almost complete collapse of left upper and lower lobes. Patient reports contrast allergy, he refused CT with contrast to rule out PE as he wastachycardic. Cardiology was consulted for elevated troponin, following. S/P left thoracentesis on 09/30/20 by IR 950 cc of dark bloody fluid removed. 2D echo 11/29 showing reduced LVEF 40-45% with global left ventricle hypokinesis.  B/L LE duplex US negative for DVT.  On 1130/21, chest x-ray showed recurrent large left pleural effusion, IR consulted for repeat left thoracentesis.  PCCM on board, recommend bronchoscopy, biopsies taken, await results.    Today, patient denies any new complaints, breathing still labored, less confused today.   Assessment & Plan:   Principal Problem:   Mass of left lung Active Problems:   Large pleural effusion   Elevated troponin   Demand ischemia (HCC)   Acute systolic heart failure (HCC)   Cerebral embolism with cerebral infarction   Acute respiratory failure with hypoxia Large left pleural effusion s/p thoracentesis Non-small cell lung CA Multifactorial, likely 2/2 left pleural effusion, s/p left  thoracentesis draining about 950 cc of fluid, exudative in nature PCCM on board, s/p EBUS/bronchoscopy Continue Xopenex Supplemental O2, I-S, flutter valve  Non-small cell lung CA Biopsy report showed above Oncology/ Rad onc consulted, recommend transfer to [place] to initiate 4 treatments course of radiation starting 99/35/70   Acute metabolic encephalopathy Numerous acute and subacute embolic infarcts Vs ? 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% on 2D echo 11/29 LHC revealed moderate diffuse LAD stenosis and 40% D2 lesion, medical management LDL 73 Cardiology on board Continue aspirin, Lipitor, metoprolol, hold losartan due to low BP Daily weights, start strict I&O Monitor closely  Leukocytosis, likely reactive Afebrile Completed 7 days of IV ceftriaxone and azithromycin, currently not on antibiotics Continue to monitor fever curve and WBC  AKI Ongoing Daily BMP  Mild transaminitis Daily CMP Monitor closely while on Lipitor  ?UTI Enterococcus faecalis UC grew 1000 colony Completed antibiotics (12/4-->12/7)   Goals of care discussion Patient with very poor prognosis Extensive discussion with daughter and spouse on 10/12/2020 Palliative care on board, switched patient to DNR Pain management, lorazepam as needed for anxiety, mirtazapine for insomnia and also appetite       DVT prophylaxis: Lovenox Code Status: Full Family Communication: Discussed  extensively with daughter and spouse at bedside on 10/12/2020  Status is: Inpatient    Dispo: The patient is from: Home              Anticipated d/c is to: Home with hospice              Anticipated d/c date is: TBD              Patient currently unstable      Consultants:  PCCM Cardiology IR Neurology Oncology    Procedures/Significant Events:  11/30  LEFT thoracentesis aspirated 950 cc fluid 11/30 PCXR;Increased size of large left pleural effusion with decreased left upper lung aeration. -Right perihilar/basilar opacities are grossly unchanged 12/1 LEFT thoracentesis; aspirated 670ml maroon-colored fluid 12/3 LEFT heart cath;1) Pt with nonobstructive CAD  Moderate diffuse mid-LAD stenosis  Moderate diffuse diagonal stenosis  Patent LCx without stenosis  Patent RCA with mild aneurysmal dilatation of the mid-vessel and aneurysmal dilatation of the PLA branch 2) Normal LVEDP    Cultures 11/28 urine positive Enterococcus faecalis 11/29 LEFT thoracentesis NGTD    Antimicrobials: Anti-infectives (From admission, onward)   Start     Ordered Stop   10/05/20 1615  amoxicillin (AMOXIL) capsule 500 mg        10/05/20 1528     10/05/20 1445  cefTRIAXone (ROCEPHIN) 2 g in sodium chloride 0.9 % 100 mL IVPB  Status:  Discontinued        10/05/20 1354 10/05/20 1527   09/29/20 1630  cefTRIAXone (ROCEPHIN) 2 g in sodium chloride 0.9 % 100 mL IVPB  Status:  Discontinued        09/29/20 1624 09/29/20 2226   09/29/20 1630  azithromycin (ZITHROMAX) 500 mg in sodium chloride 0.9 % 250 mL IVPB  Status:  Discontinued        09/29/20 1624 09/29/20 2226         Continuous Infusions: . sodium chloride       Objective: Vitals:   10/12/20 0251 10/12/20 0800 10/12/20 1200 10/12/20 1533  BP: (!) 119/94 (!) 132/97 (!) 119/91 120/78  Pulse: (!) 107 (!) 111 (!) 126 (!) 138  Resp: 20 18 (!) 22 18  Temp: 98.2 F (36.8 C) 98.4 F (36.9 C) 97.9 F (36.6 C) 98.7 F (37.1 C)   TempSrc: Oral Oral Oral Oral  SpO2: 92% 96% 94% 96%  Weight:      Height:        Intake/Output Summary (Last 24 hours) at 10/12/2020 1550 Last data filed at 10/12/2020 0250 Gross per 24 hour  Intake 180 ml  Output 550 ml  Net -370 ml   Filed Weights   10/09/20 0357 10/10/20 0324 10/11/20 0319  Weight: 87.5 kg 86.7 kg 87.8 kg   Physical Exam:  General: NAD, acutely ill-appearing  Cardiovascular: S1, S2 present  Respiratory:  Diminished breath sounds bilaterally  Abdomen: Soft, nontender, nondistended, bowel sounds present  Musculoskeletal: 1+ bilateral pedal edema noted  Skin: Normal  Psychiatry: Fair mood   CBC: Recent Labs  Lab 10/08/20 0121 10/09/20 0208 10/10/20 0133 10/11/20 0134 10/12/20 0126  WBC 12.9* 12.9* 12.7* 11.5* 11.5*  NEUTROABS 10.7* 10.9* 10.2* 9.1* 9.1*  HGB 11.5* 11.1* 11.0* 11.3* 12.0*  HCT 37.0* 36.2* 34.0* 35.3* 38.6*  MCV 79.1* 79.4* 78.7* 78.8* 79.8*  PLT 244 183 168 158 166   Basic Metabolic Panel: Recent Labs  Lab 10/07/20 0234 10/08/20 0121 10/09/20 0208 10/10/20 0133 10/11/20 0134 10/12/20 0126  NA 132*  --  136 137 136 136  K 4.2  --  4.4 4.5 4.2 4.5  CL 99  --  101 102 102 103  CO2 24  --  22 27 25 22   GLUCOSE 94  --  103* 110* 108* 84  BUN 18  --  21* 24* 21* 20  CREATININE 1.19  --  1.33* 1.52* 1.36* 1.35*  CALCIUM 8.5*  --  8.5* 8.6* 8.5* 8.5*  MG 1.8 1.9 2.1 1.9 2.0 2.0  PHOS 3.5 3.2 3.8 3.3 3.7 3.7   GFR: Estimated Creatinine Clearance: 65.4 mL/min (A) (by C-G formula based on SCr of 1.35 mg/dL (H)). Liver Function Tests: Recent Labs  Lab 10/07/20 0234 10/09/20 0208 10/10/20 0133 10/11/20 0134 10/12/20 0126  AST 40 44* 57* 53* 59*  ALT 49* 41 44 43 41  ALKPHOS 224* 177* 175* 195* 216*  BILITOT 0.8 1.3* 0.7 1.0 0.8  PROT 5.1* 5.4* 5.1* 5.4* 5.5*  ALBUMIN 2.0* 2.0* 2.0* 2.1* 2.2*   No results for input(s): LIPASE, AMYLASE in the last 168 hours. No results for input(s): AMMONIA in the last 168  hours. Coagulation Profile: No results for input(s): INR, PROTIME in the last 168 hours. Cardiac Enzymes: No results for input(s): CKTOTAL, CKMB, CKMBINDEX, TROPONINI in the last 168 hours. BNP (last 3 results) No results for input(s): PROBNP in the last 8760 hours. HbA1C: No results for input(s): HGBA1C in the last 72 hours. CBG: No results for input(s): GLUCAP in the last 168 hours. Lipid Profile: Recent Labs    10/10/20 1017  CHOL 130  HDL 35*  LDLCALC 73  TRIG 109  CHOLHDL 3.7   Thyroid Function Tests: No results for input(s): TSH, T4TOTAL, FREET4, T3FREE, THYROIDAB in the last 72 hours. Anemia Panel: Recent Labs    10/11/20 0134  VITAMINB12 1,059*  FOLATE 10.7  FERRITIN 772*  TIBC 228*  IRON 23*   Sepsis Labs: No results for input(s): PROCALCITON, LATICACIDVEN in the last 168 hours.  Recent Results (from the past 240 hour(s))  Surgical PCR screen     Status: None   Collection Time: 10/08/20  8:51 AM   Specimen: Nasal Mucosa; Nasal Swab  Result Value Ref Range Status   MRSA, PCR NEGATIVE NEGATIVE Final   Staphylococcus aureus NEGATIVE NEGATIVE Final    Comment: (NOTE) The Xpert SA Assay (FDA approved for NASAL specimens in patients 52 years of age and older), is one component of a comprehensive surveillance program. It is not intended to diagnose infection nor to guide or monitor treatment. Performed at [facility], [place] [address], [place], [place] [address]          Radiology Studies: MR BRAIN W CONTRAST  Result Date: 10/10/2020 CLINICAL DATA:  59 year old male with recent diagnosis of metastatic disease, endobronchial left upper lobe biopsy positive for non-small cell carcinoma with markers suggesting lung primary. Noncontrast MRI yesterday revealing evidence of extensive embolic infarcts. Postcontrast images now in hopes of better evaluating the possibility of metastatic disease. EXAM: MRI HEAD WITH CONTRAST TECHNIQUE: Multiplanar, multiecho  pulse sequences of the brain and surrounding structures were obtained with intravenous contrast. CONTRAST:  60mL GADAVIST GADOBUTROL 1 MMOL/ML IV SOLN COMPARISON:  Noncontrast brain MRI yesterday. FINDINGS: Postcontrast coronal T2 and 3 plane T1 imaging. Scattered small, indistinct, and frequently gyriform areas of bilateral cerebral hemisphere enhancement. Multiple enhancing foci annotated on series 4. On correlation with DWI yesterday, virtually all of the areas correspond to foci of abnormal diffusion. Furthermore, there is no abnormal  enhancement identified in the areas of DWI sparing yesterday, such as the right basal ganglia, bilateral thalami, brainstem, and much of the right cerebellum. No dural thickening or enhancement identified. No intracranial mass effect, ventriculomegaly. Compared to noncontrast T1 imaging yesterday the major dural venous sinuses are enhancing and appear to be patent. IMPRESSION: 1. Post ischemic enhancement: numerous scattered small enhancing foci which are frequently gyriform and indistinct, and correspond to areas of abnormal DWI yesterday. 2. No convincing cerebral metastatic disease at this time. Electronically Signed   By: Genevie Ann M.D.   On: 10/10/2020 19:26        Scheduled Meds: . aspirin EC  81 mg Oral Daily  . atorvastatin  40 mg Oral Daily  . digoxin  0.25 mg Oral Daily  . enoxaparin (LOVENOX) injection  40 mg Subcutaneous Q24H  . guaiFENesin-codeine  10 mL Oral QHS  . mouth rinse  15 mL Mouth Rinse BID  . metoprolol tartrate  75 mg Oral BID  . mirtazapine  15 mg Oral QHS  . mupirocin ointment  1 application Nasal BID  . sodium chloride flush  3 mL Intravenous Q12H  . sodium chloride flush  3 mL Intravenous Q12H   Continuous Infusions: . sodium chloride       LOS: 13 days        Alma Friendly, MD [facility]   If 7PM-7AM, please contact night-coverage 10/12/2020, 3:50 PM

## 2020-10-12 NOTE — Progress Notes (Addendum)
PT Cancellation Note  Patient Details Name: Brett Medina MRN: [id number] DOB: 12-16-1960   Cancelled Treatment:    Reason Eval/Treat Not Completed: Other (comment).  Elevation of BP and pulses, transition to hospice care.  No brain mets on MRI.  Will reassess for PT needs on 10/14/20.   Ramond Dial 10/12/2020, 10:46 AM   Mee Hives, PT MS Acute Rehab Dept. Number: [place] and [place]

## 2020-10-12 NOTE — Progress Notes (Signed)
   10/12/20 1613  Assess: MEWS Score  Temp 98.2 F (36.8 C)  BP 120/85  Pulse Rate 93  ECG Heart Rate (!) 166  Resp (!) 21  Level of Consciousness Alert  SpO2 94 %  O2 Device Room Air  O2 Flow Rate (L/min) 2 L/min  Assess: MEWS Score  MEWS Temp 0  MEWS Systolic 0  MEWS Pulse 3  MEWS RR 1  MEWS LOC 0  MEWS Score 4  MEWS Score Color Red  Assess: if the MEWS score is Yellow or Red  Were vital signs taken at a resting state? Yes  Focused Assessment Change from prior assessment (see assessment flowsheet)  Early Detection of Sepsis Score *See Row Information* Low  MEWS guidelines implemented *See Row Information* Yes  Treat  Pain Scale 0-10  Pain Score 0  Neuro symptoms relieved by Rest  Take Vital Signs  Increase Vital Sign Frequency  Red: Q 1hr X 4 then Q 4hr X 4, if remains red, continue Q 4hrs  Escalate  MEWS: Escalate Red: discuss with charge nurse/RN and provider, consider discussing with RRT  Notify: Charge Nurse/RN  Name of Charge Nurse/RN Notified Francee Gentile  Date Charge Nurse/RN Notified 10/12/20  Time Charge Nurse/RN Notified 1306  Notify: Provider  Provider Name/Title Dr. Rushie Chestnut, MD/P. Eulas Post, [place] (cards)  Date Provider Notified 10/12/20  Time Provider Notified 1605  Notification Type Page  Notification Reason Change in status  Response See new orders  Date of Provider Response 10/12/20  Time of Provider Response 9798  Notify: Rapid Response  Name of Rapid Response RN Notified Jaynie Bream  Date Rapid Response Notified 10/12/20  Time Rapid Response Notified 9211  Document  Patient Outcome Other (Comment) (End of life/plan to transfer to [facility])  Progress note created (see row info) Yes

## 2020-10-12 NOTE — Progress Notes (Signed)
Inpatient Rehab Admissions Coordinator Note:   Per therapy recommendations, pt was screened for CIR candidacy by Clemens Catholic, [place] CCC-SLP. Current plan for this pt. Is to transfer to [facility] this week for palliative radiation for lung mass and then discharge to hospice. I do not believe that Pt. Requires or would benefit from CIR level therapies at this time since plan is for hospice.  Please contact me with questions.   Clemens Catholic, [place], [place] Admissions Coordinator  [phone] ([place]) [phone] (office)

## 2020-10-12 NOTE — Progress Notes (Signed)
HR remain uncontrolled on 0.25mg  IV digoxin. He received 50 mg lopressor this morning, and was given additional 25 mg after that. Will give another 25 mg lopressor and change night time dose to 100 mg. Unfortunately, with metastatic cancer,pleural effusion and recent stroke, he has multiple reasons for uncontrolled HR. If because unstable, may consider IV amiodarone.

## 2020-10-12 NOTE — Progress Notes (Addendum)
Daily Progress Note   Patient Name: Brett Medina       Date: 10/12/2020 DOB: 1960/11/24  Age: 59 y.o. MRN#: [id number] Attending Physician: Alma Friendly, MD Primary Care Physician: Patient, No Pcp Per Admit Date: 09/29/2020  Reason for Consultation/Follow-up: Establishing goals of care, Non pain symptom management, Pain control and Psychosocial/spiritual support  Subjective: Received notification that daughter/Angie requested return phone call. Called Angie and spoke with her via phone for about 30 minutes. Therapeutic listening provided as she expressed concern over nursing care the patient has/hasn't received. Expressed my apologies around current situations and recommended she speak with unit's Arts administrator to inform them of incidences of concern - Angie was agreeable and I will notify unit leadership. Since Angie spent the night with the patient last night, discussed his symptom management, including anxiety, insomnia, pain, and dyspnea - Angie reports the patient slept much better last night and was much more comfortable, except for his coughing and having to wait for nursing to bring medication. Explained I could schedule a dose of cough medication at bedtime to get him through the night to sleep - she expressed appreciation. Informed Angie of order placed for unrestricted visitation status for EOL - she expressed gratitude and was thankful to be able to spend more time with the patient.  Chart review performed. Received report from primary RN - only acute concern is patient's elevated HR, she has been in contact with cardiology.   Went to visit patient at bedside - wife/[name] was present. Patient was lying in bed (on specialty air mattress) awake, alert, and able to  participate in conversation. Patient denied having pain or shortness of breath during my visit. No signs or non-verbal gestures of pain or discomfort noted. No respiratory distress, increased work of breathing, or secretions noted. Patient reported he had a much better night last night and felt much better today - wife states he was able to eat an entire oatmeal cookie - patient states that brought him much joy. [name] states she read the "Hard Choices for [employer]" book provided the other day - she explained it brought her some peace and helped her to feel less guilty - she became tearful discussing. Emotional support and validation provided to [name] as she expressed her thoughts and emotions around the current  situation. Discussed plan to schedule cough medication at bedtime for patient - patient and wife expressed appreciation. Patient otherwise seems comfortable and symptoms are currently managed - will continue current regimen. Family and patient is aware he will be transferred to [facility] by Monday morning. Patient still desires to try palliative radiation to improve dyspnea. [name] and patient are still wanting hospice on discharge - most interested in residential hospice. We discussed the patient's elevated HR and that Linda conversations might need to continue based on clinical course - patient and wife were understanding. Informed and educated them both of order placed for unrestricted visitation for EOL.  Introduced, reviewed, and completed MOST form as outlined in Recommendation section below.  Placed unrestricted visitation orders per [facility] EOL visitation policy. Patient's RN and Camera operator had questions around order. Education provided to nurses that patient does not have to be completely on comfort care for this policy to take effect; however, as long as patient is at EOL, which he is, visitation can be opened to 3 people at a time at bedside 24/7 and visitors can switch out as desires. Spoke with  hospital [facility] who agrees and will also provide nursing staff education on policy.   Daughter/Chris called with questions on how to complete POA paperwork. Education provided again that [facility] paperwork can be completed in house by our Chaplains. However, financial POA paperwork would need a lawyer/attorney. Explained they would be able to visit patient to complete paperwork as desired, but if needed, our chaplains could facilitate visit.   All questions and concerns addressed. Encouraged to call with questions and/or concerns. PMT card previously provided.   Length of Stay: 13  Current Medications: Scheduled Meds:  . aspirin EC  81 mg Oral Daily  . atorvastatin  40 mg Oral Daily  . digoxin  0.25 mg Oral Daily  . enoxaparin (LOVENOX) injection  40 mg Subcutaneous Q24H  . mouth rinse  15 mL Mouth Rinse BID  . metoprolol tartrate  75 mg Oral BID  . mirtazapine  15 mg Oral QHS  . mupirocin ointment  1 application Nasal BID  . sodium chloride flush  3 mL Intravenous Q12H  . sodium chloride flush  3 mL Intravenous Q12H    Continuous Infusions: . sodium chloride      PRN Meds: sodium chloride, acetaminophen **OR** acetaminophen, guaiFENesin-codeine, HYDROcodone-acetaminophen, levalbuterol, loperamide, LORazepam, metoprolol tartrate, morphine injection, ondansetron **OR** ondansetron (ZOFRAN) IV, polyethylene glycol, sodium chloride flush  Physical Exam Vitals and nursing note reviewed.  Constitutional:      General: He is not in acute distress.    Appearance: He is ill-appearing.     Comments: Frail appearing  Pulmonary:     Effort: No respiratory distress.  Skin:    General: Skin is warm and dry.  Neurological:     Mental Status: He is alert.     Motor: Weakness present.     Comments: Disoriented to time  Psychiatric:        Attention and Perception: Attention normal.        Behavior: Behavior is cooperative.        Cognition and Memory: Cognition normal. Memory is impaired.              Vital Signs: BP (!) 119/91 (BP Location: Right Arm)   Pulse (!) 126   Temp 97.9 F (36.6 C) (Oral)   Resp (!) 22   Ht 5\' 9"  (1.753 m)   Wt 87.8 kg   SpO2 94%  BMI 28.58 kg/m  SpO2: SpO2: 94 % O2 Device: O2 Device: Room Air O2 Flow Rate: O2 Flow Rate (L/min): 2 L/min  Intake/output summary:   Intake/Output Summary (Last 24 hours) at 10/12/2020 1525 Last data filed at 10/12/2020 0250 Gross per 24 hour  Intake 180 ml  Output 550 ml  Net -370 ml   LBM: Last BM Date: 10/11/20 Baseline Weight: Weight: 99.8 kg Most recent weight: Weight: 87.8 kg       Palliative Assessment/Data: PPS 20-30%    Flowsheet Rows   Flowsheet Row Most Recent Value  Intake Tab   Referral Department Hospitalist  Unit at Time of Referral Med/Surg Unit  Palliative Care Primary Diagnosis Cancer  Date Notified 10/10/20  Reason for referral Clarify Goals of Care  Date of Admission 10/10/20  Date first seen by Palliative Care 10/10/20  # of days Palliative referral response time 0 Day(s)  # of days IP prior to Palliative referral 0  Clinical Assessment   Psychosocial & Spiritual Assessment   Palliative Care Outcomes   Patient/Family meeting held? Yes  Who was at the meeting? patient, wife, two daughters, son in law  Palliative Care Outcomes Clarified goals of care, Counseled regarding hospice, Provided advance care planning, Provided psychosocial or spiritual support, Changed CPR status, Completed durable DNR  Patient/Family wishes: Interventions discontinued/not started  Mechanical Ventilation, BiPAP, NIPPV, Trach      Patient Active Problem List   Diagnosis Date Noted  . Cerebral embolism with cerebral infarction 10/10/2020  . Mass of left lung 10/07/2020  . Acute systolic heart failure ([place])   . Demand ischemia ([place])   . Large pleural effusion 09/29/2020  . Elevated troponin 09/29/2020    Palliative Care Assessment & Plan   Patient Profile: 59 y.o.malewith no  significantpast medical historyother than tobacco abuse x 45 years. Patient has not regularly follow with a provider for over 30 years. He was brought to the ED from home for increased shortness of breath. CT on 09/29/20 showed large left pleural effusion and suspected metastatic malignancy and lung nodules. He has undergone a thoracentesis on 11/29 and 12/1 with cytology. Brochoscopy was performed 10/08/20 - biopsies were taken that have resulted in non-small cell carcinoma, staged IV. During hospitalization, patient has also been newly diagnosed with CHF (EF 40-45%) and cardiology has been following. The patient developed acute encephalopathy and neurology was consulted - patient was found to have numerous acute and subacute embolic infarcts with associated petechial hemorrhage per MRI on 10/09/20.  Assessment: Non-small cell lung cancer, stage IV Large pleural effusion Elevated troponin Demand ischemia Acute systolic heart failure Cerebral embolism with cerebral infarction Acute respiratory failure with hypoxia Acute metabolic encephalopathy Shortness of breath Generalized weakness Nonobstructive CAD UTI  Recommendations/Plan:  Continue current medical treatment   Continue DNR/DNI as previously documented  Goal is palliative radiation Mon-Thurs next week to try and improve patient's dyspnea and then discharge with hospice; family is most interested in residential hospice. If patient continues with minimal PO intake, he will most likely be a candidate at discharge  If patient is residential hospice appropriate at discharge, recommend finding  facility in [place] close to where patient/family lives  Continue current medication regimen initiated yesterday for insomnia/appetite, pain, anxiety, dyspnea; patient reports improvement of symptoms  Initiated guaifenesin-codine 3ml PO at bedtime to help coughing at night/promote more restful sleep  MOST form completed as follows: DNR/DNI, Comfort  Measures, Determine use or limitation of antibiotics when infection occurs, IV fluids for a  defined trial period, No feeding tube. Original placed in patient's shadow chart; copy will be scanned into Vynca.  Chaplain consult placed for: Patient/family would like to complete HCPOA - unsure who will be named  Unrestricted visitation orders were placed per current [place] EOL visitation policy   Ongoing palliative discussions pending clinical course  PMT will continue to follow peripherally. If there are any imminent needs please call the service directly    Goals of Care and Additional Recommendations:  Limitations on Scope of Treatment: No Artificial Feeding, No Hemodialysis, No Surgical Procedures and No Tracheostomy  Code Status:    Code Status Orders  (From admission, onward)         Start     Ordered   10/10/20 1448  Do not attempt resuscitation (DNR)  Continuous       Question Answer Comment  In the event of cardiac or respiratory ARREST Do not call a "code blue"   In the event of cardiac or respiratory ARREST Do not perform Intubation, CPR, defibrillation or ACLS   In the event of cardiac or respiratory ARREST Use medication by any route, position, wound care, and other measures to relive pain and suffering. May use oxygen, suction and manual treatment of airway obstruction as needed for comfort.      10/10/20 1447        Code Status History    Date Active Date Inactive Code Status Order ID Comments User Context   09/29/2020 2226 10/10/2020 1447 Full Code [id number]  Bethena Roys, MD Inpatient   Advance Care Planning Activity       Prognosis:   Likely weeks  Discharge Planning:  Hospice facility is primary choice if appropriate at discharge  Care plan was discussed with primary RN, patient, wife, daughters  Thank you for allowing the Palliative Medicine Team to assist in the care of this patient.   Total Time 65 minutes  Prolonged Time  Billed  yes       Greater than 50%  of this time was spent counseling and coordinating care related to the above assessment and plan.  Lin Landsman, NP  Please contact Palliative Medicine Team phone at [phone] for questions and concerns.

## 2020-10-12 NOTE — Progress Notes (Signed)
Progress Note  Patient Name: MARUICE PIERONI Date of Encounter: 10/12/2020  [facility] Cardiologist: Elouise Munroe, MD   Subjective   Denies palpitations. Planned to go to [facility] on Monday for Radiation Therapy.  No change in SOB.  Inpatient Medications    Scheduled Meds: . aspirin EC  81 mg Oral Daily  . atorvastatin  40 mg Oral Daily  . digoxin  0.25 mg Oral Daily  . enoxaparin (LOVENOX) injection  40 mg Subcutaneous Q24H  . mouth rinse  15 mL Mouth Rinse BID  . metoprolol tartrate  50 mg Oral BID  . mirtazapine  15 mg Oral QHS  . mupirocin ointment  1 application Nasal BID  . sodium chloride flush  3 mL Intravenous Q12H  . sodium chloride flush  3 mL Intravenous Q12H   Continuous Infusions: . sodium chloride     PRN Meds: sodium chloride, acetaminophen **OR** acetaminophen, guaiFENesin-codeine, HYDROcodone-acetaminophen, levalbuterol, loperamide, LORazepam, metoprolol tartrate, morphine injection, ondansetron **OR** ondansetron (ZOFRAN) IV, polyethylene glycol, sodium chloride flush   Vital Signs    Vitals:   10/11/20 1930 10/12/20 0001 10/12/20 0251 10/12/20 0800  BP: (!) 128/101 (!) 123/101 (!) 119/94 (!) 132/97  Pulse:  (!) 101 (!) 107 (!) 111  Resp: 16 18 20 18   Temp: 99 F (37.2 C) 98.9 F (37.2 C) 98.2 F (36.8 C) 98.4 F (36.9 C)  TempSrc: Oral Oral Oral Oral  SpO2: 96% 97% 92% 96%  Weight:      Height:        Intake/Output Summary (Last 24 hours) at 10/12/2020 0948 Last data filed at 10/12/2020 0250 Gross per 24 hour  Intake 300 ml  Output 551 ml  Net -251 ml   Last 3 Weights 10/11/2020 10/10/2020 10/09/2020  Weight (lbs) 193 lb 9 oz 191 lb 2.2 oz 192 lb 14.4 oz  Weight (kg) 87.8 kg 86.7 kg 87.5 kg      Telemetry    SVT (MAT vs AFl)  Short NSVT vs SVT with aberrancy  - Personally Reviewed  ECG    No new - Personally Reviewed  Physical Exam   VS:  BP (!) 132/97 (BP Location: Right Arm)   Pulse (!) 111   Temp 98.4 F (36.9 C)  (Oral)   Resp 18   Ht 5\' 9"  (1.753 m)   Wt 87.8 kg   SpO2 96%   BMI 28.58 kg/m  , BMI Body mass index is 28.58 kg/m.   GENERAL:  Mild distress with no tachypnea NECK:  No jugular venous distentionLUNGS:  PULM: Diminished on the L base.  Rhonchi on L, Poor Air movement bilaterally HEART:  Tachycardic but reggular; S1 and S2 within normal limits, no S3, no S4, no clicks, no rubs, no murmurs ABD:  positive bowel sounds normal no hepatomegaly, no splenomegaly EXT:  2 plus pulses bilateral radial; no edema SKIN:  No rashes no nodules.  Mottled LEs  PSYCH:  Cognitively intact, oriented to person place and time   Labs    High Sensitivity Troponin:   Recent Labs  Lab 09/29/20 1635 09/29/20 2032 09/30/20 0213 10/01/20 0917  TROPONINIHS 1,087* 1,380* 1,446* 746*      Chemistry Recent Labs  Lab 10/10/20 0133 10/11/20 0134 10/12/20 0126  NA 137 136 136  K 4.5 4.2 4.5  CL 102 102 103  CO2 27 25 22   GLUCOSE 110* 108* 84  BUN 24* 21* 20  CREATININE 1.52* 1.36* 1.35*  CALCIUM 8.6* 8.5* 8.5*  PROT 5.1* 5.4* 5.5*  ALBUMIN 2.0* 2.1* 2.2*  AST 57* 53* 59*  ALT 44 43 41  ALKPHOS 175* 195* 216*  BILITOT 0.7 1.0 0.8  GFRNONAA 53* >60 >60  ANIONGAP 8 9 11      Hematology Recent Labs  Lab 10/10/20 0133 10/11/20 0134 10/12/20 0126  WBC 12.7* 11.5* 11.5*  RBC 4.32 4.48 4.84  HGB 11.0* 11.3* 12.0*  HCT 34.0* 35.3* 38.6*  MCV 78.7* 78.8* 79.8*  MCH 25.5* 25.2* 24.8*  MCHC 32.4 32.0 31.1  RDW 17.1* 17.1* 16.9*  PLT 168 158 151    BNPNo results for input(s): BNP, PROBNP in the last 168 hours.   DDimer No results for input(s): DDIMER in the last 168 hours.   Radiology    MR BRAIN W CONTRAST  Result Date: 10/10/2020 CLINICAL DATA:  59 year old male with recent diagnosis of metastatic disease, endobronchial left upper lobe biopsy positive for non-small cell carcinoma with markers suggesting lung primary. Noncontrast MRI yesterday revealing evidence of extensive embolic  infarcts. Postcontrast images now in hopes of better evaluating the possibility of metastatic disease. EXAM: MRI HEAD WITH CONTRAST TECHNIQUE: Multiplanar, multiecho pulse sequences of the brain and surrounding structures were obtained with intravenous contrast. CONTRAST:  73mL GADAVIST GADOBUTROL 1 MMOL/ML IV SOLN COMPARISON:  Noncontrast brain MRI yesterday. FINDINGS: Postcontrast coronal T2 and 3 plane T1 imaging. Scattered small, indistinct, and frequently gyriform areas of bilateral cerebral hemisphere enhancement. Multiple enhancing foci annotated on series 4. On correlation with DWI yesterday, virtually all of the areas correspond to foci of abnormal diffusion. Furthermore, there is no abnormal enhancement identified in the areas of DWI sparing yesterday, such as the right basal ganglia, bilateral thalami, brainstem, and much of the right cerebellum. No dural thickening or enhancement identified. No intracranial mass effect, ventriculomegaly. Compared to noncontrast T1 imaging yesterday the major dural venous sinuses are enhancing and appear to be patent. IMPRESSION: 1. Post ischemic enhancement: numerous scattered small enhancing foci which are frequently gyriform and indistinct, and correspond to areas of abnormal DWI yesterday. 2. No convincing cerebral metastatic disease at this time. Electronically Signed   By: Genevie Ann M.D.   On: 10/10/2020 19:26   DG CHEST PORT 1 VIEW  Result Date: 10/10/2020 CLINICAL DATA:  Tachycardia, shortness of breath EXAM: PORTABLE CHEST 1 VIEW COMPARISON:  10/08/2020 FINDINGS: Continued complete opacification of the left hemithorax, unchanged. Right basilar patchy airspace opacity again noted, unchanged. No visible effusion on the right. No acute bony abnormality. IMPRESSION: No significant change since prior study. Electronically Signed   By: Rolm Baptise M.D.   On: 10/10/2020 12:27   VAS US CAROTID  Result Date: 10/10/2020 Carotid Arterial Duplex Study Indications:        CVA. Limitations        Today's exam was limited due to the patient's respiratory                    variation. Comparison Study:  no prior Performing Technologist: Abram Sander RVS  Examination Guidelines: A complete evaluation includes B-mode imaging, spectral Doppler, color Doppler, and power Doppler as needed of all accessible portions of each vessel. Bilateral testing is considered an integral part of a complete examination. Limited examinations for reoccurring indications may be performed as noted.  Right Carotid Findings: +----------+--------+--------+--------+------------------+--------+           PSV cm/sEDV cm/sStenosisPlaque DescriptionComments +----------+--------+--------+--------+------------------+--------+ CCA Prox  66      7  heterogenous               +----------+--------+--------+--------+------------------+--------+ CCA Distal73      14              heterogenous               +----------+--------+--------+--------+------------------+--------+ ICA Prox  36      9       1-39%   heterogenous               +----------+--------+--------+--------+------------------+--------+ ICA Distal37      17                                         +----------+--------+--------+--------+------------------+--------+ ECA       87      9                                          +----------+--------+--------+--------+------------------+--------+ +----------+--------+-------+--------+-------------------+           PSV cm/sEDV cmsDescribeArm Pressure (mmHG) +----------+--------+-------+--------+-------------------+ [id number]                                         +----------+--------+-------+--------+-------------------+ +---------+--------+--+--------+--+---------+ VertebralPSV cm/s73EDV cm/s19Antegrade +---------+--------+--+--------+--+---------+  Left Carotid Findings: +----------+--------+--------+--------+------------------+--------+            PSV cm/sEDV cm/sStenosisPlaque DescriptionComments +----------+--------+--------+--------+------------------+--------+ CCA Prox  47      11              heterogenous               +----------+--------+--------+--------+------------------+--------+ CCA Distal69      14              heterogenous               +----------+--------+--------+--------+------------------+--------+ ICA Prox  45      14      1-39%   heterogenous               +----------+--------+--------+--------+------------------+--------+ ICA Distal76      26                                         +----------+--------+--------+--------+------------------+--------+ ECA       104                                                +----------+--------+--------+--------+------------------+--------+ +----------+--------+--------+--------+-------------------+           PSV cm/sEDV cm/sDescribeArm Pressure (mmHG) +----------+--------+--------+--------+-------------------+ Subclavian70                                          +----------+--------+--------+--------+-------------------+ +---------+--------+--+--------+--+---------+ VertebralPSV cm/s37EDV cm/s11Antegrade +---------+--------+--+--------+--+---------+   Summary: Right Carotid: Velocities in the right ICA are consistent with a 1-39% stenosis. Left Carotid: Velocities in the left ICA are consistent with a 1-39% stenosis. Vertebrals: Bilateral vertebral arteries demonstrate antegrade flow. *See table(s) above for measurements and  observations.  Electronically signed by Antony Contras MD on 10/10/2020 at 12:17:14 PM.    Final    VAS [place] TRANSCRANIAL DOPPLER  Result Date: 10/10/2020  Transcranial Doppler with Bubble Indications: Stroke. Performing Technologist: Abram Sander RVS  Examination Guidelines: A complete evaluation includes B-mode imaging, spectral Doppler, color Doppler, and power Doppler as needed of all accessible portions of each  vessel. Bilateral testing is considered an integral part of a complete examination. Limited examinations for reoccurring indications may be performed as noted.  +----------+-------------+----------+-----------+-------+ RIGHT TCD Right VM (cm)Depth (cm)PulsatilityComment +----------+-------------+----------+-----------+-------+ MCA           21.00                 1.28            +----------+-------------+----------+-----------+-------+ Term ICA      33.00                 1.10            +----------+-------------+----------+-----------+-------+ ICA siphon    27.00                 1.11            +----------+-------------+----------+-----------+-------+  +----------+------------+----------+-----------+-------+ LEFT TCD  Left VM (cm)Depth (cm)PulsatilityComment +----------+------------+----------+-----------+-------+ MCA          25.00                 1.36            +----------+------------+----------+-----------+-------+ Term ICA     14.00                 1.27            +----------+------------+----------+-----------+-------+ PCA          14.00                 0.89            +----------+------------+----------+-----------+-------+ Opthalmic    24.00                 1.52            +----------+------------+----------+-----------+-------+ ICA siphon   28.00                 1.05            +----------+------------+----------+-----------+-------+  Summary:  Poor bitemporal and absent suboccipital window limit exam.Low normal mean flow velocities in majority of identified vessels of anterior circulation of unclear significance *See table(s) above for TCD measurements and observations.  Diagnosing physician: Antony Contras MD Electronically signed by Antony Contras MD on 10/10/2020 at 12:18:38 PM.    Final     Cardiac Studies   LHC 10/04/20:   Dist LAD lesion is 50% stenosed.  1st RPL lesion is 20% stenosed.  Mid RCA lesion is 25% stenosed.  2nd Diag  lesion is 40% stenosed.  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 No severe lesions or culprit for NSTEMI - suspect demand ischemia  Echo 09/30/20: 1. Left ventricular ejection fraction, by estimation, is 40 to 45%. The  left ventricle has mildly decreased function. The left ventricle  demonstrates global hypokinesis. Left ventricular diastolic parameters are  indeterminate.  2. Right ventricular systolic function is normal. The right  ventricular  size is normal. Tricuspid regurgitation signal is inadequate for assessing  PA pressure.  3. The mitral valve is normal in structure. No evidence of mitral valve  regurgitation. No evidence of mitral stenosis.  4. The aortic valve was not well visualized. Aortic valve regurgitation  is trivial. No aortic stenosis is present.  5. Aortic dilatation noted. 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  6. The inferior vena cava is dilated in size with <50% respiratory  variability, suggesting right atrial pressure of 15 mmHg.   Patient Profile     59 y.o. male with hypertension, tobacco abuse, and limited medical care admitted with exertional dyspnea and found to have hypoxic respiratory failure, acute systolic heart failure and metastatic lung cancer.   Assessment & Plan    # MAT:  # Atrial flutter:  - continue telemetry - Not a candidate for anticoagulation for now due to acute/subacute embolic stroke and malignant pleural effusion.  - Given his embolic event and concern for thrombus, will avoid antiarrhythmics.  - Dig level for 10/14/20 ordered; given improvement in BP will add metoprolol 25 mg PO X1 and increase metoprolol tartrate to 75 mg BID  # Embolic stroke:   - Confusion has resolved - No TEE given that he is transitioning to palliation and not an anticoagulation candidate.   # Acute systolic heart failure:  LVEF 40-45%.  Newly diagnosed this admission.   Nonobstructive disease on cath.   - Increase metoprolol as above and consolidate to succinate when able.  - Holding losartan due to AKI  # Non-obstructive CAD:  Cath revealed moderate diffuse LAD stenosis and 40% D2 lesion.  Manage medically.  Continue atorvastatin.  He does have mild transaminitis.  This will need to be monitored over time.  Continue aspirin and beta blocker as above.  - ASA and statin could be stopped if GOC change  #Anemia: - stable  # AKI: - creatinine stable  # Hypoxia: # Likely malignancy:  Per primary team.  Breathing improved with thoracentesis.  Planning for palliative XRT 10/14/20 (at [facility])  For questions or updates, please contact [place] Please consult [email or web address] for contact info under     Signed, Werner Lean, MD  10/12/2020, 9:48 AM

## 2020-10-13 LAB — COMPREHENSIVE METABOLIC PANEL
ALT: 43 U/L (ref 0–44)
AST: 70 U/L — ABNORMAL HIGH (ref 15–41)
Albumin: 2.1 g/dL — ABNORMAL LOW (ref 3.5–5.0)
Alkaline Phosphatase: 254 U/L — ABNORMAL HIGH (ref 38–126)
Anion gap: 9 (ref 5–15)
BUN: 26 mg/dL — ABNORMAL HIGH (ref 6–20)
CO2: 23 mmol/L (ref 22–32)
Calcium: 8.5 mg/dL — ABNORMAL LOW (ref 8.9–10.3)
Chloride: 102 mmol/L (ref 98–111)
Creatinine, Ser: 1.56 mg/dL — ABNORMAL HIGH (ref 0.61–1.24)
GFR, Estimated: 51 mL/min — ABNORMAL LOW (ref >60)
Glucose, Bld: 111 mg/dL — ABNORMAL HIGH (ref 70–99)
Potassium: 4.5 mmol/L (ref 3.5–5.1)
Sodium: 134 mmol/L — ABNORMAL LOW (ref 135–145)
Total Bilirubin: 0.8 mg/dL (ref 0.3–1.2)
Total Protein: 5.5 g/dL — ABNORMAL LOW (ref 6.5–8.1)

## 2020-10-13 LAB — CBC WITH DIFFERENTIAL/PLATELET
Abs Immature Granulocytes: 0.19 10*3/uL — ABNORMAL HIGH (ref 0.00–0.07)
Basophils Absolute: 0 10*3/uL (ref 0.0–0.1)
Basophils Relative: 0 %
Eosinophils Absolute: 0 10*3/uL (ref 0.0–0.5)
Eosinophils Relative: 0 %
HCT: 37.4 % — ABNORMAL LOW (ref 39.0–52.0)
Hemoglobin: 11.5 g/dL — ABNORMAL LOW (ref 13.0–17.0)
Immature Granulocytes: 1 %
Lymphocytes Relative: 5 %
Lymphs Abs: 0.7 10*3/uL (ref 0.7–4.0)
MCH: 24.5 pg — ABNORMAL LOW (ref 26.0–34.0)
MCHC: 30.7 g/dL (ref 30.0–36.0)
MCV: 79.7 fL — ABNORMAL LOW (ref 80.0–100.0)
Monocytes Absolute: 1.5 10*3/uL — ABNORMAL HIGH (ref 0.1–1.0)
Monocytes Relative: 11 %
Neutro Abs: 11.4 10*3/uL — ABNORMAL HIGH (ref 1.7–7.7)
Neutrophils Relative %: 83 %
Platelets: 151 10*3/uL (ref 150–400)
RBC: 4.69 MIL/uL (ref 4.22–5.81)
RDW: 16.9 % — ABNORMAL HIGH (ref 11.5–15.5)
WBC: 13.8 10*3/uL — ABNORMAL HIGH (ref 4.0–10.5)
nRBC: 0 % (ref 0.0–0.2)

## 2020-10-13 LAB — MAGNESIUM: Magnesium: 2 mg/dL (ref 1.7–2.4)

## 2020-10-13 LAB — PHOSPHORUS: Phosphorus: 3.8 mg/dL (ref 2.5–4.6)

## 2020-10-13 NOTE — Progress Notes (Addendum)
PROGRESS NOTE    Brett Medina  MVH:[id number] DOB: [birth year] DOA: 09/29/2020 PCP: Patient, No Pcp Per     Brief Narrative:  Brett Medina a 59 y.o.WM PMHx Tobacco abuse.  Presented to the ED with complaints of difficulty breathing ongoing over the past 36months at least. Reports he was seen at urgent care several was given prednisone. Patient quit smoking cigarettes about 2 months ago. Has chronic unchanged bilateral lower extremity swelling. Reports a productive cough over the past few weeks. No family history of heart attacks. In the ED, O2 sats 91 to 97% on room air. Tachycardic to 135, mild tachypnea to 22.Lactic acid 1.3. WBC 13.2. Troponin 1087. UA with moderate hemoglobin. Chest CT-Constellation of findings consistent with metastatic malignancy, moderate to large volume left pleural effusion with almost complete collapse of left upper and lower lobes. Patient reports contrast allergy, he refused CT with contrast to rule out PE as he wastachycardic. Cardiology was consulted for elevated troponin, following. S/P left thoracentesis on 09/30/20 by IR 950 cc of dark bloody fluid removed. 2D echo 11/29 showing reduced LVEF 40-45% with global left ventricle hypokinesis.  B/L LE duplex US negative for DVT.  On 1130/21, chest x-ray showed recurrent large left pleural effusion, IR consulted for repeat left thoracentesis.  PCCM on board, recommend bronchoscopy, biopsies taken, await results.    Today, patient still with labored breathing, persistently tachycardic, denies any chest pain, abdominal pain, nausea/vomiting, fever/chills.   Assessment & Plan:   Principal Problem:   Mass of left lung Active Problems:   Large pleural effusion   Elevated troponin   Demand ischemia (HCC)   Acute systolic heart failure (HCC)   Cerebral embolism with cerebral infarction   Acute respiratory failure with hypoxia Large left pleural effusion s/p thoracentesis Non-small cell lung  CA Multifactorial, likely 2/2 left pleural effusion, s/p left thoracentesis draining about 950 cc of fluid, exudative in nature PCCM on board, s/p EBUS/bronchoscopy Continue Xopenex Supplemental O2, I-S, flutter valve  Non-small cell lung CA Biopsy report showed above Oncology/ Rad onc consulted, recommend transfer to [place] to initiate 4 treatments course of palliative radiation starting 84/13/24   Acute metabolic encephalopathy Numerous acute and subacute embolic infarcts Vs ? Metastasis MRI brain showed above, not convincing for metastasis A1c 5.7, LDL 73 B/L LE duplex US negative for DVT Neurology on board, poor anticoagulation candidate, no further work-up, recommend palliative Hold off on further anticoagulation/antiplatelet for now, continue aspirin, lipitor (may stop if GOC changes) Unable to perform TEE due to poor anticoagulation candidate and transitioning to palliative PT/OT/SLP  Possible MAT/atrial flutter TSH normal Cardiology on board, continue digoxin, metoprolol, adjust as needed Hold off on anticoagulation for now  Acute systolic CHF Elevated troponin, suspect demand ischemia 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% on 2D echo 11/29 LHC revealed moderate diffuse LAD stenosis and 40% D2 lesion, medical management LDL 73 Cardiology on board Continue aspirin, Lipitor, metoprolol, hold losartan due to low BP Daily weights, start strict I&O Monitor closely  Leukocytosis, likely reactive Afebrile Completed 7 days of IV ceftriaxone and azithromycin, currently not on antibiotics Continue to monitor fever curve and WBC  AKI Ongoing Daily BMP  Mild transaminitis Daily CMP Monitor closely while on Lipitor  ?UTI Enterococcus faecalis UC grew 1000 colony Completed antibiotics (12/4-->12/7)   Goals of care discussion Patient with very poor prognosis Extensive discussion with daughter and spouse on 10/12/2020 Palliative care on board, switched patient to  DNR Pain management, lorazepam as needed for anxiety,  mirtazapine for insomnia and also appetite       DVT prophylaxis: Lovenox Code Status: Full Family Communication: Discussed extensively with daughter and spouse at bedside on 10/13/2020  Status is: Inpatient    Dispo: The patient is from: Home              Anticipated d/c is to: Home with hospice              Anticipated d/c date is: TBD              Patient currently unstable      Consultants:  PCCM Cardiology IR Neurology Oncology    Procedures/Significant Events:  11/30  LEFT thoracentesis aspirated 950 cc fluid 11/30 PCXR;Increased size of large left pleural effusion with decreased left upper lung aeration. -Right perihilar/basilar opacities are grossly unchanged 12/1 LEFT thoracentesis; aspirated 66ml maroon-colored fluid 12/3 LEFT heart cath;1) Pt with nonobstructive CAD  Moderate diffuse mid-LAD stenosis  Moderate diffuse diagonal stenosis  Patent LCx without stenosis  Patent RCA with mild aneurysmal dilatation of the mid-vessel and aneurysmal dilatation of the PLA branch 2) Normal LVEDP    Cultures 11/28 urine positive Enterococcus faecalis 11/29 LEFT thoracentesis NGTD    Antimicrobials: Anti-infectives (From admission, onward)   Start     Ordered Stop   10/05/20 1615  amoxicillin (AMOXIL) capsule 500 mg        10/05/20 1528     10/05/20 1445  cefTRIAXone (ROCEPHIN) 2 g in sodium chloride 0.9 % 100 mL IVPB  Status:  Discontinued        10/05/20 1354 10/05/20 1527   09/29/20 1630  cefTRIAXone (ROCEPHIN) 2 g in sodium chloride 0.9 % 100 mL IVPB  Status:  Discontinued        09/29/20 1624 09/29/20 2226   09/29/20 1630  azithromycin (ZITHROMAX) 500 mg in sodium chloride 0.9 % 250 mL IVPB  Status:  Discontinued        09/29/20 1624 09/29/20 2226         Continuous Infusions: . sodium chloride       Objective: Vitals:   10/13/20 0600 10/13/20 0750 10/13/20 0800 10/13/20 1201   BP: 109/86 109/82 (!) 118/95 108/85  Pulse: (!) 102 (!) 103 (!) 106 94  Resp: 18 18 16 20   Temp:  98 F (36.7 C) 98 F (36.7 C) 99 F (37.2 C)  TempSrc:  Oral Oral Oral  SpO2: 98% 99% 98% 97%  Weight:      Height:        Intake/Output Summary (Last 24 hours) at 10/13/2020 1525 Last data filed at 10/13/2020 1209 Gross per 24 hour  Intake 0 ml  Output 950 ml  Net -950 ml   Filed Weights   10/10/20 0324 10/11/20 0319 10/13/20 0500  Weight: 86.7 kg 87.8 kg 86.2 kg     Physical Exam:  General: NAD, acutely ill-appearing, deconditioned  Cardiovascular: S1, S2 present  Respiratory:  Diminished breath sounds bilaterally  Abdomen: Soft, nontender, nondistended, bowel sounds present  Musculoskeletal: 1+ bilateral pedal edema noted  Skin: Normal  Psychiatry: Fair mood   CBC: Recent Labs  Lab 10/09/20 0208 10/10/20 0133 10/11/20 0134 10/12/20 0126 10/13/20 0041  WBC 12.9* 12.7* 11.5* 11.5* 13.8*  NEUTROABS 10.9* 10.2* 9.1* 9.1* 11.4*  HGB 11.1* 11.0* 11.3* 12.0* 11.5*  HCT 36.2* 34.0* 35.3* 38.6* 37.4*  MCV 79.4* 78.7* 78.8* 79.8* 79.7*  PLT 183 168 158 151 893   Basic Metabolic Panel: Recent Labs  Lab  10/09/20 0208 10/10/20 0133 10/11/20 0134 10/12/20 0126 10/13/20 0041  NA 136 137 136 136 134*  K 4.4 4.5 4.2 4.5 4.5  CL 101 102 102 103 102  CO2 22 27 25 22 23   GLUCOSE 103* 110* 108* 84 111*  BUN 21* 24* 21* 20 26*  CREATININE 1.33* 1.52* 1.36* 1.35* 1.56*  CALCIUM 8.5* 8.6* 8.5* 8.5* 8.5*  MG 2.1 1.9 2.0 2.0 2.0  PHOS 3.8 3.3 3.7 3.7 3.8   GFR: Estimated Creatinine Clearance: 56.1 mL/min (A) (by C-G formula based on SCr of 1.56 mg/dL (H)). Liver Function Tests: Recent Labs  Lab 10/09/20 0208 10/10/20 0133 10/11/20 0134 10/12/20 0126 10/13/20 0041  AST 44* 57* 53* 59* 70*  ALT 41 44 43 41 43  ALKPHOS 177* 175* 195* 216* 254*  BILITOT 1.3* 0.7 1.0 0.8 0.8  PROT 5.4* 5.1* 5.4* 5.5* 5.5*  ALBUMIN 2.0* 2.0* 2.1* 2.2* 2.1*   No results  for input(s): LIPASE, AMYLASE in the last 168 hours. No results for input(s): AMMONIA in the last 168 hours. Coagulation Profile: No results for input(s): INR, PROTIME in the last 168 hours. Cardiac Enzymes: No results for input(s): CKTOTAL, CKMB, CKMBINDEX, TROPONINI in the last 168 hours. BNP (last 3 results) No results for input(s): PROBNP in the last 8760 hours. HbA1C: No results for input(s): HGBA1C in the last 72 hours. CBG: No results for input(s): GLUCAP in the last 168 hours. Lipid Profile: No results for input(s): CHOL, HDL, LDLCALC, TRIG, CHOLHDL, LDLDIRECT in the last 72 hours. Thyroid Function Tests: No results for input(s): TSH, T4TOTAL, FREET4, T3FREE, THYROIDAB in the last 72 hours. Anemia Panel: Recent Labs    10/11/20 0134  VITAMINB12 1,059*  FOLATE 10.7  FERRITIN 772*  TIBC 228*  IRON 23*   Sepsis Labs: No results for input(s): PROCALCITON, LATICACIDVEN in the last 168 hours.  Recent Results (from the past 240 hour(s))  Surgical PCR screen     Status: None   Collection Time: 10/08/20  8:51 AM   Specimen: Nasal Mucosa; Nasal Swab  Result Value Ref Range Status   MRSA, PCR NEGATIVE NEGATIVE Final   Staphylococcus aureus NEGATIVE NEGATIVE Final    Comment: (NOTE) The Xpert SA Assay (FDA approved for NASAL specimens in patients 33 years of age and older), is one component of a comprehensive surveillance program. It is not intended to diagnose infection nor to guide or monitor treatment. Performed at [facility], [place] [address] [place], [place] [address]          Radiology Studies: No results found.      Scheduled Meds: . aspirin EC  81 mg Oral Daily  . atorvastatin  40 mg Oral Daily  . digoxin  0.25 mg Oral Daily  . enoxaparin (LOVENOX) injection  40 mg Subcutaneous Q24H  . guaiFENesin-codeine  10 mL Oral QHS  . mouth rinse  15 mL Mouth Rinse BID  . metoprolol tartrate  100 mg Oral BID  . mirtazapine  15 mg Oral QHS  . sodium  chloride flush  3 mL Intravenous Q12H  . sodium chloride flush  3 mL Intravenous Q12H   Continuous Infusions: . sodium chloride       LOS: 14 days        Alma Friendly, MD [facility]   If 7PM-7AM, please contact night-coverage 10/13/2020, 3:25 PM

## 2020-10-13 NOTE — Progress Notes (Signed)
Progress Note  Patient Name: Brett Medina Date of Encounter: 10/13/2020  [facility] Cardiologist: Elouise Munroe, MD   Subjective   Patient notes that he is doing well.  Since day prior had continued tachycardia requiring both IV medication and PO changes (now on 100 mg PO BID metoprolol tartrate.    No change in SOB.  No palpitations or near-syncope.  Inpatient Medications    Scheduled Meds:  aspirin EC  81 mg Oral Daily   atorvastatin  40 mg Oral Daily   digoxin  0.25 mg Oral Daily   enoxaparin (LOVENOX) injection  40 mg Subcutaneous Q24H   guaiFENesin-codeine  10 mL Oral QHS   mouth rinse  15 mL Mouth Rinse BID   metoprolol tartrate  100 mg Oral BID   mirtazapine  15 mg Oral QHS   mupirocin ointment  1 application Nasal BID   sodium chloride flush  3 mL Intravenous Q12H   sodium chloride flush  3 mL Intravenous Q12H   Continuous Infusions:  sodium chloride     PRN Meds: sodium chloride, acetaminophen **OR** acetaminophen, guaiFENesin-codeine, HYDROcodone-acetaminophen, levalbuterol, loperamide, LORazepam, metoprolol tartrate, morphine injection, ondansetron **OR** ondansetron (ZOFRAN) IV, polyethylene glycol, sodium chloride flush   Vital Signs    Vitals:   10/13/20 0400 10/13/20 0500 10/13/20 0600 10/13/20 0750  BP: (!) 113/94 (!) 139/100 109/86 109/82  Pulse: 100 (!) 103 (!) 102 (!) 103  Resp: 14 15 18 18   Temp:    98 F (36.7 C)  TempSrc:    Oral  SpO2: 97% 98% 98% 99%  Weight:  86.2 kg    Height:        Intake/Output Summary (Last 24 hours) at 10/13/2020 0924 Last data filed at 10/12/2020 2000 Gross per 24 hour  Intake 0 ml  Output 400 ml  Net -400 ml   Last 3 Weights 10/13/2020 10/11/2020 10/10/2020  Weight (lbs) 190 lb 193 lb 9 oz 191 lb 2.2 oz  Weight (kg) 86.183 kg 87.8 kg 86.7 kg      Telemetry    Sinus Tachycardia with frequent runs of SVT with some SVT with aberrancy- Personally Reviewed  ECG    No New -  Personally Reviewed  Physical Exam   VS:  BP 109/82 (BP Location: Right Arm)    Pulse (!) 103    Temp 98 F (36.7 C) (Oral)    Resp 18    Ht 5\' 9"  (1.753 m)    Wt 86.2 kg    SpO2 99%    BMI 28.06 kg/m  , BMI Body mass index is 28.06 kg/m.   GENERAL:  Middle Aged Male with no acute distress NECK:  No jugular venous distention  PULM:  Poor air movement bilaterally worse on L with decreased effort HEART:  Tachycardic but regular; no murmurs, gallops, or rubs ABD:  positive bowel sounds normal no hepatomegaly, no splenomegaly EXT:  no edema SKIN:  No rashes no nodules.  Mottled LEs  PSYCH:  Cognitively intact, oriented to person place and time   Labs    High Sensitivity Troponin:   Recent Labs  Lab 09/29/20 1635 09/29/20 2032 09/30/20 0213 10/01/20 0917  TROPONINIHS 1,087* 1,380* 1,446* 746*      Chemistry Recent Labs  Lab 10/11/20 0134 10/12/20 0126 10/13/20 0041  NA 136 136 134*  K 4.2 4.5 4.5  CL 102 103 102  CO2 25 22 23   GLUCOSE 108* 84 111*  BUN 21* 20 26*  CREATININE  1.36* 1.35* 1.56*  CALCIUM 8.5* 8.5* 8.5*  PROT 5.4* 5.5* 5.5*  ALBUMIN 2.1* 2.2* 2.1*  AST 53* 59* 70*  ALT 43 41 43  ALKPHOS 195* 216* 254*  BILITOT 1.0 0.8 0.8  GFRNONAA >60 >60 51*  ANIONGAP 9 11 9      Hematology Recent Labs  Lab 10/11/20 0134 10/12/20 0126 10/13/20 0041  WBC 11.5* 11.5* 13.8*  RBC 4.48 4.84 4.69  HGB 11.3* 12.0* 11.5*  HCT 35.3* 38.6* 37.4*  MCV 78.8* 79.8* 79.7*  MCH 25.2* 24.8* 24.5*  MCHC 32.0 31.1 30.7  RDW 17.1* 16.9* 16.9*  PLT 158 151 151    BNPNo results for input(s): BNP, PROBNP in the last 168 hours.   DDimer No results for input(s): DDIMER in the last 168 hours.   Radiology    No results found.  Cardiac Studies   LHC 10/04/20:   Dist LAD lesion is 50% stenosed.  1st RPL lesion is 20% stenosed.  Mid RCA lesion is 25% stenosed.  2nd Diag lesion is 40% stenosed.  1) Pt with nonobstructive CAD  Moderate diffuse mid-LAD  stenosis  Moderate diffuse diagonal stenosis  Patent LCx without stenosis  Patent RCA with mild aneurysmal dilatation of the mid-vessel and aneurysmal dilatation of the PLA branch 2) Normal LVEDP  Recommend: medical therapy. No severe lesions or culprit for NSTEMI - suspect demand ischemia  Echo 09/30/20: 1. Left ventricular ejection fraction, by estimation, is 40 to 45%. The  left ventricle has mildly decreased function. The left ventricle  demonstrates global hypokinesis. Left ventricular diastolic parameters are  indeterminate.  2. Right ventricular systolic function is normal. The right ventricular  size is normal. Tricuspid regurgitation signal is inadequate for assessing  PA pressure.  3. The mitral valve is normal in structure. No evidence of mitral valve  regurgitation. No evidence of mitral stenosis.  4. The aortic valve was not well visualized. Aortic valve regurgitation  is trivial. No aortic stenosis is present.  5. Aortic dilatation noted. 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  6. The inferior vena cava is dilated in size with <50% respiratory  variability, suggesting right atrial pressure of 15 mmHg.   Patient Profile     59 y.o. male with hypertension, tobacco abuse, and limited medical care admitted with exertional dyspnea and found to have hypoxic respiratory failure, acute systolic heart failure and metastatic lung cancer.   Assessment & Plan    # MAT:  # Atrial flutter:  - continue telemetry - Not a candidate for anticoagulation for now due to acute/subacute embolic stroke and malignant pleural effusion.  - Given his embolic event and concern for thrombus, will avoid antiarrhythmics.  - Dig level for 10/14/20 ordered; tolerating 100 mg PO metoprolol tartrate BID without hypotension; deferring CCB in the setting of HFrEF, continue on digoxin - slight transaminitis (AST/ALT 70/43 with AST trending up); given this, present  lack of symptoms, and overall prognosis; would favor not adding amiodarone at this time.  If SVT becomes symptoms or hemodynamically significant can IV load amiodarone; though this would increase his stroke risk (risk of repeat embolic stroke)  # Embolic stroke:  - Confusion has resolved - No TEE given that he is transitioning to palliation and not an anticoagulation candidate.   # Acute systolic heart failure:  LVEF 40-45%.  Newly diagnosed this admission.   Nonobstructive disease on cath.   - Increase metoprolol as above and consolidate to succinate when able.  -  Holding losartan due to AKI  # Non-obstructive CAD:  Cath revealed moderate diffuse LAD stenosis and 40% D2 lesion.  Manage medically.  Continue atorvastatin.  He does have mild transaminitis.  This will need to be monitored over time.  Continue aspirin and beta blocker as above.  - ASA and statin could be stopped if GOC change  #Anemia: - stable  # AKI: - creatinine stable (fluctates from 1.3-1.5)  # Hypoxia: # Likely malignancy:  Per primary team.  Breathing improved post thoracentesis.  Planning for palliative XRT 10/14/20 (at [facility])  Discussed tachycardia and care with patient, wife, daughter, and nursing team  For questions or updates, please contact [place] Please consult [email or web address] for contact info under     Signed, Werner Lean, MD  10/13/2020, 9:24 AM

## 2020-10-14 ENCOUNTER — Encounter: Payer: Self-pay | Admitting: Radiation Oncology

## 2020-10-14 ENCOUNTER — Ambulatory Visit: Admit: 2020-10-14 | Payer: Self-pay

## 2020-10-14 ENCOUNTER — Ambulatory Visit
Admit: 2020-10-14 | Discharge: 2020-10-14 | Disposition: A | Discharge status: Home / Self Care | Payer: Self-pay | Attending: Radiation Oncology | Admitting: Radiation Oncology

## 2020-10-14 DIAGNOSIS — I471 Supraventricular tachycardia: Secondary | ICD-10-CM

## 2020-10-14 DIAGNOSIS — C3412 Malignant neoplasm of upper lobe, left bronchus or lung: Secondary | ICD-10-CM

## 2020-10-14 LAB — CBC WITH DIFFERENTIAL/PLATELET
Abs Immature Granulocytes: 0.16 10*3/uL — ABNORMAL HIGH (ref 0.00–0.07)
Basophils Absolute: 0 10*3/uL (ref 0.0–0.1)
Basophils Relative: 0 %
Eosinophils Absolute: 0.1 10*3/uL (ref 0.0–0.5)
Eosinophils Relative: 0 %
HCT: 36.8 % — ABNORMAL LOW (ref 39.0–52.0)
Hemoglobin: 11.6 g/dL — ABNORMAL LOW (ref 13.0–17.0)
Immature Granulocytes: 1 %
Lymphocytes Relative: 5 %
Lymphs Abs: 0.8 10*3/uL (ref 0.7–4.0)
MCH: 25 pg — ABNORMAL LOW (ref 26.0–34.0)
MCHC: 31.5 g/dL (ref 30.0–36.0)
MCV: 79.3 fL — ABNORMAL LOW (ref 80.0–100.0)
Monocytes Absolute: 1.5 10*3/uL — ABNORMAL HIGH (ref 0.1–1.0)
Monocytes Relative: 10 %
Neutro Abs: 12.4 10*3/uL — ABNORMAL HIGH (ref 1.7–7.7)
Neutrophils Relative %: 84 %
Platelets: 163 10*3/uL (ref 150–400)
RBC: 4.64 MIL/uL (ref 4.22–5.81)
RDW: 17 % — ABNORMAL HIGH (ref 11.5–15.5)
WBC: 14.9 10*3/uL — ABNORMAL HIGH (ref 4.0–10.5)
nRBC: 0 % (ref 0.0–0.2)

## 2020-10-14 LAB — COMPREHENSIVE METABOLIC PANEL
ALT: 48 U/L — ABNORMAL HIGH (ref 0–44)
AST: 84 U/L — ABNORMAL HIGH (ref 15–41)
Albumin: 2.1 g/dL — ABNORMAL LOW (ref 3.5–5.0)
Alkaline Phosphatase: 410 U/L — ABNORMAL HIGH (ref 38–126)
Anion gap: 13 (ref 5–15)
BUN: 29 mg/dL — ABNORMAL HIGH (ref 6–20)
CO2: 22 mmol/L (ref 22–32)
Calcium: 8.3 mg/dL — ABNORMAL LOW (ref 8.9–10.3)
Chloride: 100 mmol/L (ref 98–111)
Creatinine, Ser: 1.54 mg/dL — ABNORMAL HIGH (ref 0.61–1.24)
GFR, Estimated: 52 mL/min — ABNORMAL LOW (ref >60)
Glucose, Bld: 111 mg/dL — ABNORMAL HIGH (ref 70–99)
Potassium: 4.6 mmol/L (ref 3.5–5.1)
Sodium: 135 mmol/L (ref 135–145)
Total Bilirubin: 0.9 mg/dL (ref 0.3–1.2)
Total Protein: 5.4 g/dL — ABNORMAL LOW (ref 6.5–8.1)

## 2020-10-14 LAB — DIGOXIN LEVEL: Digoxin Level: 1.3 ng/mL (ref 0.8–2.0)

## 2020-10-14 NOTE — Progress Notes (Signed)
Notified by Merrilee Seashore, RT that patient refuses to come down for initial radiation treatment. Phoned Velva Harman, RN on [place] to inquire. Velva Harman explains she just left out of the patient's room and neither she nor his wife could convince him to come down for radiation. Velva Harman relays that the patient says "I just don't want to go through it." Velva Harman verbalizes her understanding that the patient does not wish to pursue any radiation therapy at all. Informed Merrilee Seashore, RT on [facility] and the radiation providers of this finding.

## 2020-10-14 NOTE — TOC Progression Note (Signed)
Transition of Care [facility]) - Progression Note    Patient Details  Name: CALLAN NORDEN MRN: [id number] Date of Birth: [birth year]  Transition of Care [facility]) CM/SW Contact  Colby Catanese, Juliann Pulse, RN Phone Number: 10/14/2020, 4:07 PM  Clinical Narrative:  DNR. HHPT/aide recc;spoke to [place] ex spouse-assisting in care-dme needed-3n1,02,w/c,hospital bed-spouse says dme will not fit into home;will stay in [place]. Await Palliative recc.     Expected Discharge Plan: [place] Barriers to Discharge: Continued Medical Work up  Expected Discharge Plan and Services Expected Discharge Plan: [place] In-house Referral: Hospice / Palliative Care,Chaplain,Financial Counselor     Living arrangements for the past 2 months: Single Family Home                                       Social Determinants of Health (SDOH) Interventions    Readmission Risk Interventions No flowsheet data found.

## 2020-10-14 NOTE — Progress Notes (Signed)
SLP Cancellation Note  Patient Details Name: Brett Medina MRN: [id number] DOB: [birth year]   Cancelled treatment:       Reason Eval/Treat Not Completed: Patient unavailable - transferred to [facility] today. Will continue to follow as able.   Osie Bond., M.A. [place] [facility] Pager [phone] Office [phone]  10/14/2020, 12:58 PM

## 2020-10-14 NOTE — Progress Notes (Addendum)
PROGRESS NOTE    Brett Medina  IHK:[id number] DOB: [birth year] DOA: 09/29/2020 PCP: Brett Medina     Brief Narrative:  Brett Green [name] a 59 y.o.WM PMHx Tobacco abuse.  Presented to the ED with complaints of difficulty breathing ongoing over the past 74months at least. Reports he was seen at urgent care several was given prednisone. Patient quit smoking cigarettes about 2 months ago. Has chronic unchanged bilateral lower extremity swelling. Reports a productive cough over the past few weeks. No family history of heart attacks. In the ED, O2 sats 91 to 97% on room air. Tachycardic to 135, mild tachypnea to 22.Lactic acid 1.3. WBC 13.2. Troponin 1087. UA with moderate hemoglobin. Chest CT-Constellation of findings consistent with metastatic malignancy, moderate to large volume left pleural effusion with almost complete collapse of left upper and lower lobes. Patient reports contrast allergy, he refused CT with contrast to rule out PE as he wastachycardic. Cardiology was consulted for elevated troponin, following. S/P left thoracentesis on 09/30/20 by IR 950 cc of dark bloody fluid removed. 2D echo 11/29 showing reduced LVEF 40-45% with global left ventricle hypokinesis.  B/L LE duplex US negative for DVT.  On 1130/21, chest x-ray showed recurrent large left pleural effusion, IR consulted for repeat left thoracentesis.       Today, patient still with labored breathing, still tachycardic, but awake, alert, somewhat oriented. Still lethargic with very poor appetite. Patient denied any chest pain, abdominal pain, nausea/vomiting, fever/chills.    Assessment & Plan:   Principal Problem:   Mass of left lung Active Problems:   Large pleural effusion   Elevated troponin   Demand ischemia (HCC)   Acute systolic heart failure (HCC)   Cerebral embolism with cerebral infarction    Acute respiratory failure with hypoxia Large left pleural effusion s/p thoracentesis Non-small  cell lung CA Multifactorial, likely 2/2 left pleural effusion, s/p left thoracentesis draining about 950 cc of fluid, exudative in nature PCCM on board, s/p EBUS/bronchoscopy Continue Xopenex Supplemental O2, I-S, flutter valve  Non-small cell lung CA Biopsy report showed above Oncology/ Rad onc consulted, recommend transfer to [place] to initiate 4 treatments course of palliative radiation starting 75/64/33   Acute metabolic encephalopathy Numerous acute and subacute embolic infarcts Vs ? Metastasis MRI brain showed above, not convincing for metastasis A1c 5.7, LDL 73 B/L LE duplex US negative for DVT Neurology on board, poor anticoagulation candidate, no further work-up Hold off on further anticoagulation/antiplatelet for now, continue aspirin, d/c lipitor Unable to perform TEE due to poor anticoagulation candidate and transitioning to palliative PT/OT/SLP  Possible MAT/atrial flutter TSH normal Cardiology on board, continue digoxin, metoprolol, adjust as needed Hold off on anticoagulation for now  Acute systolic CHF Elevated troponin, suspect demand ischemia 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% on 2D echo 11/29 LHC revealed moderate diffuse LAD stenosis and 40% D2 lesion, medical management LDL 73 Cardiology on board Continue aspirin, Lipitor, metoprolol, hold losartan due to soft BP Daily weights, start strict I&O Monitor closely  Leukocytosis, likely reactive Afebrile Completed 7 days of IV ceftriaxone and azithromycin, currently not on antibiotics Continue to monitor fever curve and WBC  AKI Ongoing Daily BMP  Mild transaminitis Daily CMP D/C lipitor  ?UTI Enterococcus faecalis UC grew 1000 colony Completed antibiotics (12/4-->12/7)   Goals of care discussion Patient with very poor prognosis Extensive discussion with daughter and spouse on 10/12/2020 Palliative care on board, switched patient to DNR Pain management, lorazepam as needed for anxiety,  mirtazapine for  insomnia and also appetite       DVT prophylaxis: Lovenox Code Status: Full Family Communication: Discussed extensively with spouse at bedside on 10/14/2020  Status is: Inpatient    Dispo: The patient is from: Home              Anticipated d/c is to: Home with hospice              Anticipated d/c date is: TBD              Patient currently unstable      Consultants:  PCCM Cardiology IR Neurology Oncology    Procedures/Significant Events:  11/30  LEFT thoracentesis aspirated 950 cc fluid 11/30 PCXR;Increased size of large left pleural effusion with decreased left upper lung aeration. -Right perihilar/basilar opacities are grossly unchanged 12/1 LEFT thoracentesis; aspirated 613ml maroon-colored fluid 12/3 LEFT heart cath;1) Pt with nonobstructive CAD  Moderate diffuse mid-LAD stenosis  Moderate diffuse diagonal stenosis  Patent LCx without stenosis  Patent RCA with mild aneurysmal dilatation of the mid-vessel and aneurysmal dilatation of the PLA branch 2) Normal LVEDP    Cultures 11/28 urine positive Enterococcus faecalis 11/29 LEFT thoracentesis NGTD    Antimicrobials: Anti-infectives (From admission, onward)   Start     Ordered Stop   10/05/20 1615  amoxicillin (AMOXIL) capsule 500 mg        10/05/20 1528     10/05/20 1445  cefTRIAXone (ROCEPHIN) 2 g in sodium chloride 0.9 % 100 mL IVPB  Status:  Discontinued        10/05/20 1354 10/05/20 1527   09/29/20 1630  cefTRIAXone (ROCEPHIN) 2 g in sodium chloride 0.9 % 100 mL IVPB  Status:  Discontinued        09/29/20 1624 09/29/20 2226   09/29/20 1630  azithromycin (ZITHROMAX) 500 mg in sodium chloride 0.9 % 250 mL IVPB  Status:  Discontinued        09/29/20 1624 09/29/20 2226         Continuous Infusions: . sodium chloride       Objective: Vitals:   10/13/20 2300 10/14/20 0000 10/14/20 0400 10/14/20 0853  BP: (!) 130/96 110/78 (!) 112/92 (!) 119/99  Pulse: (!) 114 96 (!)  105 (!) 116  Resp: 20 18 16 18   Temp: 98.5 F (36.9 C)  98.3 F (36.8 C) 99.4 F (37.4 C)  TempSrc: Oral  Oral Oral  SpO2: 95% 94% 95% 96%  Weight:   85.7 kg   Height:        Intake/Output Summary (Last 24 hours) at 10/14/2020 0942 Last data filed at 10/14/2020 0855 Gross Medina 24 hour  Intake --  Output 975 ml  Net -975 ml   Filed Weights   10/11/20 0319 10/13/20 0500 10/14/20 0400  Weight: 87.8 kg 86.2 kg 85.7 kg     Physical Exam:  General: NAD, acutely ill-appearing, deconditioned  Cardiovascular: S1, S2 present  Respiratory:  Diminished breath sounds bilaterally  Abdomen: Soft, nontender, nondistended, bowel sounds present  Musculoskeletal: 1+ bilateral pedal edema noted  Skin: Normal  Psychiatry: Fair mood   CBC: Recent Labs  Lab 10/10/20 0133 10/11/20 0134 10/12/20 0126 10/13/20 0041 10/14/20 0822  WBC 12.7* 11.5* 11.5* 13.8* 14.9*  NEUTROABS 10.2* 9.1* 9.1* 11.4* 12.4*  HGB 11.0* 11.3* 12.0* 11.5* 11.6*  HCT 34.0* 35.3* 38.6* 37.4* 36.8*  MCV 78.7* 78.8* 79.8* 79.7* 79.3*  PLT 168 158 151 151 366   Basic Metabolic Panel: Recent Labs  Lab 10/09/20 0208  10/10/20 0133 10/11/20 0134 10/12/20 0126 10/13/20 0041 10/14/20 0822  NA 136 137 136 136 134* 135  K 4.4 4.5 4.2 4.5 4.5 4.6  CL 101 102 102 103 102 100  CO2 22 27 25 22 23 22   GLUCOSE 103* 110* 108* 84 111* 111*  BUN 21* 24* 21* 20 26* 29*  CREATININE 1.33* 1.52* 1.36* 1.35* 1.56* 1.54*  CALCIUM 8.5* 8.6* 8.5* 8.5* 8.5* 8.3*  MG 2.1 1.9 2.0 2.0 2.0  --   PHOS 3.8 3.3 3.7 3.7 3.8  --    GFR: Estimated Creatinine Clearance: 56.7 mL/min (A) (by C-G formula based on SCr of 1.54 mg/dL (H)). Liver Function Tests: Recent Labs  Lab 10/10/20 0133 10/11/20 0134 10/12/20 0126 10/13/20 0041 10/14/20 0822  AST 57* 53* 59* 70* 84*  ALT 44 43 41 43 48*  ALKPHOS 175* 195* 216* 254* 410*  BILITOT 0.7 1.0 0.8 0.8 0.9  PROT 5.1* 5.4* 5.5* 5.5* 5.4*  ALBUMIN 2.0* 2.1* 2.2* 2.1* 2.1*   No  results for input(s): LIPASE, AMYLASE in the last 168 hours. No results for input(s): AMMONIA in the last 168 hours. Coagulation Profile: No results for input(s): INR, PROTIME in the last 168 hours. Cardiac Enzymes: No results for input(s): CKTOTAL, CKMB, CKMBINDEX, TROPONINI in the last 168 hours. BNP (last 3 results) No results for input(s): PROBNP in the last 8760 hours. HbA1C: No results for input(s): HGBA1C in the last 72 hours. CBG: No results for input(s): GLUCAP in the last 168 hours. Lipid Profile: No results for input(s): CHOL, HDL, LDLCALC, TRIG, CHOLHDL, LDLDIRECT in the last 72 hours. Thyroid Function Tests: No results for input(s): TSH, T4TOTAL, FREET4, T3FREE, THYROIDAB in the last 72 hours. Anemia Panel: No results for input(s): VITAMINB12, FOLATE, FERRITIN, TIBC, IRON, RETICCTPCT in the last 72 hours. Sepsis Labs: No results for input(s): PROCALCITON, LATICACIDVEN in the last 168 hours.  Recent Results (from the past 240 hour(s))  Surgical PCR screen     Status: None   Collection Time: 10/08/20  8:51 AM   Specimen: Nasal Mucosa; Nasal Swab  Result Value Ref Range Status   MRSA, PCR NEGATIVE NEGATIVE Final   Staphylococcus aureus NEGATIVE NEGATIVE Final    Comment: (NOTE) The Xpert SA Assay (FDA approved for NASAL specimens in patients 36 years of age and older), is one component of a comprehensive surveillance program. It is not intended to diagnose infection nor to guide or monitor treatment. Performed at [facility], [place] [address] [place], [place] [address]          Radiology Studies: No results found.      Scheduled Meds: . aspirin EC  81 mg Oral Daily  . atorvastatin  40 mg Oral Daily  . digoxin  0.25 mg Oral Daily  . enoxaparin (LOVENOX) injection  40 mg Subcutaneous Q24H  . guaiFENesin-codeine  10 mL Oral QHS  . mouth rinse  15 mL Mouth Rinse BID  . metoprolol tartrate  100 mg Oral BID  . mirtazapine  15 mg Oral QHS  . sodium  chloride flush  3 mL Intravenous Q12H  . sodium chloride flush  3 mL Intravenous Q12H   Continuous Infusions: . sodium chloride       LOS: 15 days        Alma Friendly, MD [facility]   If 7PM-7AM, please contact night-coverage 10/14/2020, 9:42 AM

## 2020-10-14 NOTE — Progress Notes (Signed)
SLP Cancellation Note  Patient Details Name: Brett Medina MRN: [id number] DOB: [birth year]   Cancelled treatment:       Reason Eval/Treat Not Completed: Other (comment) (per locator, pt at radiation oncology at this time, will continue effort)  Kathleen Lime, MS [place] Office [phone] Pager [phone]   Macario Golds 10/14/2020, 2:53 PM

## 2020-10-14 NOTE — Progress Notes (Addendum)
Progress Note  Patient Name: Brett Medina Date of Encounter: 10/14/2020  Primary Cardiologist: Elouise Munroe, MD  Subjective   No specific complaints today, breathing is ok and no chest pain. HRs elevated however no AM meds given>>due for metoprolol and dig. Palliative care following due to poor prognosis>>DNR  Inpatient Medications    Scheduled Meds: . aspirin EC  81 mg Oral Daily  . atorvastatin  40 mg Oral Daily  . digoxin  0.25 mg Oral Daily  . enoxaparin (LOVENOX) injection  40 mg Subcutaneous Q24H  . guaiFENesin-codeine  10 mL Oral QHS  . mouth rinse  15 mL Mouth Rinse BID  . metoprolol tartrate  100 mg Oral BID  . mirtazapine  15 mg Oral QHS  . sodium chloride flush  3 mL Intravenous Q12H  . sodium chloride flush  3 mL Intravenous Q12H   Continuous Infusions: . sodium chloride     PRN Meds: sodium chloride, acetaminophen **OR** acetaminophen, guaiFENesin-codeine, HYDROcodone-acetaminophen, levalbuterol, loperamide, LORazepam, metoprolol tartrate, morphine injection, ondansetron **OR** ondansetron (ZOFRAN) IV, polyethylene glycol, sodium chloride flush   Vital Signs    Vitals:   10/13/20 2200 10/13/20 2300 10/14/20 0000 10/14/20 0400  BP: (!) 115/93 (!) 130/96 110/78 (!) 112/92  Pulse: (!) 112 (!) 114 96 (!) 105  Resp: 16 20 18 16   Temp:  98.5 F (36.9 C)  98.3 F (36.8 C)  TempSrc:  Oral  Oral  SpO2: 95% 95% 94% 95%  Weight:    85.7 kg  Height:        Intake/Output Summary (Last 24 hours) at 10/14/2020 0848 Last data filed at 10/13/2020 1209 Gross per 24 hour  Intake --  Output 550 ml  Net -550 ml   Filed Weights   10/11/20 0319 10/13/20 0500 10/14/20 0400  Weight: 87.8 kg 86.2 kg 85.7 kg    Physical Exam   General: Ill appearing, NAD Neck: Negative for carotid bruits. No JVD Lungs: Diminished in bilateral lobes. No wheezes, rales, or rhonchi. Breathing is unlabored. Cardiovascular: Irregular with S1 S2. + murmur Abdomen: Soft,  non-tender, non-distended. No obvious abdominal masses. Extremities: Mild BLE edema. Radial pulses 2+ bilaterally Neuro: Alert and oriented. No focal deficits. No facial asymmetry. MAE spontaneously. Psych: Responds to questions appropriately with normal affect.    Labs    Chemistry Recent Labs  Lab 10/11/20 0134 10/12/20 0126 10/13/20 0041  NA 136 136 134*  K 4.2 4.5 4.5  CL 102 103 102  CO2 25 22 23   GLUCOSE 108* 84 111*  BUN 21* 20 26*  CREATININE 1.36* 1.35* 1.56*  CALCIUM 8.5* 8.5* 8.5*  PROT 5.4* 5.5* 5.5*  ALBUMIN 2.1* 2.2* 2.1*  AST 53* 59* 70*  ALT 43 41 43  ALKPHOS 195* 216* 254*  BILITOT 1.0 0.8 0.8  GFRNONAA >60 >60 51*  ANIONGAP 9 11 9      Hematology Recent Labs  Lab 10/11/20 0134 10/12/20 0126 10/13/20 0041  WBC 11.5* 11.5* 13.8*  RBC 4.48 4.84 4.69  HGB 11.3* 12.0* 11.5*  HCT 35.3* 38.6* 37.4*  MCV 78.8* 79.8* 79.7*  MCH 25.2* 24.8* 24.5*  MCHC 32.0 31.1 30.7  RDW 17.1* 16.9* 16.9*  PLT 158 151 151    Cardiac EnzymesNo results for input(s): TROPONINI in the last 168 hours. No results for input(s): TROPIPOC in the last 168 hours.   BNPNo results for input(s): BNP, PROBNP in the last 168 hours.   DDimer No results for input(s): DDIMER in the  last 168 hours.   Radiology    No results found.  Telemetry    10/14/20: atrial tachycardia with rates in the 110-120bpm- Personally Reviewed  ECG    No new tracing as of 10/14/20- Personally Reviewed  Cardiac Studies   LHC 10/04/20:   Dist LAD lesion is 50% stenosed.  1st RPL lesion is 20% stenosed.  Mid RCA lesion is 25% stenosed.  2nd Diag lesion is 40% stenosed.  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 No severe lesions or culprit for  NSTEMI - suspect demand ischemia  Echo 09/30/20: 1. Left ventricular ejection fraction, by estimation, is 40 to 45%. The  left ventricle has mildly decreased function. The left ventricle  demonstrates global hypokinesis. Left ventricular diastolic parameters are  indeterminate.  2. Right ventricular systolic function is normal. The right ventricular  size is normal. Tricuspid regurgitation signal is inadequate for assessing  PA pressure.  3. The mitral valve is normal in structure. No evidence of mitral valve  regurgitation. No evidence of mitral stenosis.  4. The aortic valve was not well visualized. Aortic valve regurgitation  is trivial. No aortic stenosis is present.  5. Aortic dilatation noted. 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  6. The inferior vena cava is dilated in size with <50% respiratory  variability, suggesting right atrial pressure of 15 mmHg.   Patient Profile     59 y.o. male with hypertension, tobacco abuse, and limited medical care admitted with exertional dyspnea and found to have hypoxic respiratory failure, acute systolic heart failure and metastatic lung cancer.   Assessment & Plan    1. MAT/Atrial flutter: -Felt not to be a candidate for anticoagulation due to subacute/acute embolic CVA and malignant pleural effusion  -Avoid antiarrhythmics -Placed on digoxin for better rate control  -Continue digoxin, metoprolol  -No CCB in the setting of HFrEF  2. Embolic CVA: -No plans for TEE given transition to palliative care -No anticoagulaiton   3. Acute systolic CHF: -EF found to be 40-45% on echo  -Nonobstructive CAD on LHC -Holding losartan secondary to renal insufficiency  4. Nonobstructive CAD: -Per LHC this admission with moderate diffuse LAD stenosis and 40% D2 lesion with recommendations to treat medically .  -Continue aspirin and beta blocker as above.  -ASA and statin could be stopped if GOC change  5.  AKI: -Creatinine, 1.56 today>>>previously 1.35 yesterday   6. Malignant lung CA: -Per primary team -Plan for palliative care for [place] establishment  -Plan for palliative XTR 10/14/20 at [place], Kathyrn Drown NP-C [place] Pager: [phone] 10/14/2020, 8:48 AM    For questions or updates, please contact   Please consult [email or web address] for contact info under Cardiology/STEMI.  Patient seen and examined.  Agree with above documentation.  On exam, patient is alert, regular rate and rhythm, no murmurs, diminished breath sounds, no LE edema.  Telemetry shows sinus rhythm currently, possible AT early up to 150s.  Continue digoxin, metoprolol.  Donato Heinz, MD

## 2020-10-14 NOTE — Progress Notes (Signed)
  Radiation Oncology         [phone] ________________________________  Name: ABDULKADIR EMMANUEL MRN: [id number]  Date: 10/14/2020  DOB: 08/11/1961  INPATIENT  SIMULATION AND TREATMENT PLANNING NOTE    ICD-10-CM   1. 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  NARRATIVE:  The patient was brought to the [facility].  Identity was confirmed.  All relevant records and images related to the planned course of therapy were reviewed.  The patient freely provided informed written consent to proceed with treatment after reviewing the details related to the planned course of therapy. The consent form was witnessed and verified by the simulation staff.  Then, the patient was set-up in a stable reproducible  supine position for radiation therapy.  CT images were obtained.  Surface markings were placed.  The CT images were loaded into the planning software.  Then the target and avoidance structures were contoured.  Treatment planning then occurred.  The radiation prescription was entered and confirmed.  Then, I designed and supervised the construction of a total of 6 medically necessary complex treatment devices, including a BodyFix immobilization mold custom fitted to the patient along with 5 multileaf collimators conformally shaped radiation around the treatment target while shielding critical structures such as the heart and spinal cord maximally.  I have requested : 3D Simulation  I have requested a DVH of the following structures: Left lung, right lung, spinal cord, heart, esophagus, and target.   PLAN:  The patient will receive 20 Gy in 4 fractions to potentially open his left mainstem bronchus for palliation of dyspnea.  Radiation treatment will be scheduled for completion on Thursday 10/17/20.  Ideally, a response to radiation may be  noted in 10-14 days.  In the interim, molecular testing will be pending to optimize systemic therapy.  ________________________________  Sheral Apley. Tammi Klippel, M.D.

## 2020-10-14 NOTE — Progress Notes (Addendum)
Physical Therapy Treatment Patient Details Name: Brett Medina MRN: [id number] DOB: February 14, 1961 Today's Date: 10/14/2020    History of Present Illness 59 y/o M presenting to ED on 11/28 for SOB that has gotten worse over past months. Found to have L chronic pleural effusions that continues to wrosen despite 11/29 and 12/1 L thorancentesis. CT on 11/28 also found R metastatic findings in R lung. 36/6 MRI with embolic shower with associated petechial hemorrhages in multiple vascular territories suggests cardiac source and possibly possibility of septic emboli.  PMHx: tobacco use and LVEF 40-45%.    PT Comments    Pt admitted with above diagnosis. Pt was only able to perform a few UE exercises as once he began LE exercises, HR to 180 bpm with little movement of LEs.  Did scoot pt up in bed as he was slid down and pt appreciative.  Updated d/c plan as it appears family may try to arrange 24 hour care.  If they do, equipment as below however if they cant will need SNF.  Pt starting radiation today therefore will continue to follow pt and update plan as needed.  Biggest issue at home is a flight of steps to enter apartment. Will revise goals today given that pt has had a decline.  Pt currently with functional limitations due to medical issues and strength and endurance deficits. Pt will benefit from skilled PT to increase their independence and safety with mobility to allow discharge to the venue listed below.     Follow Up Recommendations  Home health PT;Supervision/Assistance - 24 hour (only if pt with 24 hour care, if not, will need SNF)     Equipment Recommendations  3in1 (PT);Wheelchair (measurements PT);Wheelchair cushion (measurements PT);Hospital bed    Recommendations for Other Services       Precautions / Restrictions Precautions Precautions: Fall Restrictions Weight Bearing Restrictions: No    Mobility  Bed Mobility Overal bed mobility: Needs Assistance             General  bed mobility comments: Scooted pt up in bed with total assist.  Transfers                    Ambulation/Gait                 Stairs             Wheelchair Mobility    Modified Rankin (Stroke Patients Only) Modified Rankin (Stroke Patients Only) Pre-Morbid Rankin Score: No symptoms Modified Rankin: Moderately severe disability     Balance                                            Cognition Arousal/Alertness: Awake/alert Behavior During Therapy: Flat affect Overall Cognitive Status: Impaired/Different from baseline Area of Impairment: Orientation;Memory;Following commands;Safety/judgement;Problem solving;Attention                 Orientation Level: Disoriented to;Situation;Time Current Attention Level: Focused Memory: Decreased short-term memory Following Commands: Follows one step commands with increased time Safety/Judgement: Decreased awareness of safety;Decreased awareness of deficits   Problem Solving: Slow processing;Decreased initiation;Difficulty sequencing;Requires verbal cues General Comments: Pt slow to respond to commands, delayed processing of information and difficulty attending to tasks.  Poor safety awareness as well.      Exercises General Exercises - Upper Extremity Shoulder Flexion: AAROM;Both;5 reps;Supine Shoulder Extension: AAROM;Both;5 reps;Supine Elbow  Flexion: AAROM;Both;5 reps;Supine Elbow Extension: AAROM;Both;5 reps;Supine General Exercises - Lower Extremity Ankle Circles/Pumps: AROM;Both;5 reps;Supine Heel Slides: AAROM;Both;5 reps;Supine (Had to terminate exercise of LEs as HR to 180 bpm with movement of LEs.)    General Comments        Pertinent Vitals/Pain Faces Pain Scale: No hurt    Home Living                      Prior Function            PT Goals (current goals can now be found in the care plan section) Acute Rehab PT Goals Patient Stated Goal: to go home PT Goal  Formulation: With patient Time For Goal Achievement: 10/28/20 Potential to Achieve Goals: Good Progress towards PT goals: Not progressing toward goals - comment;Goals downgraded-see care plan (limited by incr HR)    Frequency    Min 3X/week      PT Plan Current plan remains appropriate;Discharge plan needs to be updated    Co-evaluation              AM-PAC PT "6 Clicks" Mobility   Outcome Measure  Help needed turning from your back to your side while in a flat bed without using bedrails?: Total Help needed moving from lying on your back to sitting on the side of a flat bed without using bedrails?: Total Help needed moving to and from a bed to a chair (including a wheelchair)?: Total Help needed standing up from a chair using your arms (e.g., wheelchair or bedside chair)?: Total Help needed to walk in hospital room?: Total Help needed climbing 3-5 steps with a railing? : Total 6 Click Score: 6    End of Session Equipment Utilized During Treatment: Oxygen Activity Tolerance: Patient limited by fatigue (HR to 180 bpm with LE exercises only) Patient left: in bed;with call bell/phone within reach;with family/visitor present Nurse Communication: Mobility status;Need for lift equipment PT Visit Diagnosis: Unsteadiness on feet (R26.81);Muscle weakness (generalized) (M62.81)     Time: 5883-2549 PT Time Calculation (min) (ACUTE ONLY): 12 min  Charges:  $Therapeutic Exercise: 8-22 mins                     Jahniya Duzan W,PT Acute Rehabilitation Services Pager:  [phone]  Office:  [place] 10/14/2020, 1:45 PM

## 2020-10-14 NOTE — Progress Notes (Signed)
Received call from tele about pt HR in 150's.  This RN went to assess pt. Pt asymptomatic. Wife at bedside and explains , "he never feels it, it's normal for him, but he doesn't feel it ".  No signs of distress. Pt resting comfortably.  Will continue to monitor.

## 2020-10-14 NOTE — Progress Notes (Signed)
Informed by transport that patient refused radiation.  Will await follow up plan from MD.

## 2020-10-15 ENCOUNTER — Ambulatory Visit: Payer: Self-pay

## 2020-10-15 DIAGNOSIS — I471 Supraventricular tachycardia: Secondary | ICD-10-CM

## 2020-10-15 DIAGNOSIS — C3412 Malignant neoplasm of upper lobe, left bronchus or lung: Principal | ICD-10-CM

## 2020-10-15 LAB — CBC WITH DIFFERENTIAL/PLATELET
Abs Immature Granulocytes: 0.17 10*3/uL — ABNORMAL HIGH (ref 0.00–0.07)
Basophils Absolute: 0.1 10*3/uL (ref 0.0–0.1)
Basophils Relative: 0 %
Eosinophils Absolute: 0 10*3/uL (ref 0.0–0.5)
Eosinophils Relative: 0 %
HCT: 39.6 % (ref 39.0–52.0)
Hemoglobin: 12.2 g/dL — ABNORMAL LOW (ref 13.0–17.0)
Immature Granulocytes: 1 %
Lymphocytes Relative: 5 %
Lymphs Abs: 0.7 10*3/uL (ref 0.7–4.0)
MCH: 25.1 pg — ABNORMAL LOW (ref 26.0–34.0)
MCHC: 30.8 g/dL (ref 30.0–36.0)
MCV: 81.3 fL (ref 80.0–100.0)
Monocytes Absolute: 1.5 10*3/uL — ABNORMAL HIGH (ref 0.1–1.0)
Monocytes Relative: 9 %
Neutro Abs: 13.1 10*3/uL — ABNORMAL HIGH (ref 1.7–7.7)
Neutrophils Relative %: 85 %
Platelets: 168 10*3/uL (ref 150–400)
RBC: 4.87 MIL/uL (ref 4.22–5.81)
RDW: 17 % — ABNORMAL HIGH (ref 11.5–15.5)
WBC: 15.6 10*3/uL — ABNORMAL HIGH (ref 4.0–10.5)
nRBC: 0 % (ref 0.0–0.2)

## 2020-10-15 LAB — COMPREHENSIVE METABOLIC PANEL
ALT: 54 U/L — ABNORMAL HIGH (ref 0–44)
AST: 94 U/L — ABNORMAL HIGH (ref 15–41)
Albumin: 2.4 g/dL — ABNORMAL LOW (ref 3.5–5.0)
Alkaline Phosphatase: 373 U/L — ABNORMAL HIGH (ref 38–126)
Anion gap: 12 (ref 5–15)
BUN: 35 mg/dL — ABNORMAL HIGH (ref 6–20)
CO2: 23 mmol/L (ref 22–32)
Calcium: 8.4 mg/dL — ABNORMAL LOW (ref 8.9–10.3)
Chloride: 103 mmol/L (ref 98–111)
Creatinine, Ser: 1.64 mg/dL — ABNORMAL HIGH (ref 0.61–1.24)
GFR, Estimated: 48 mL/min — ABNORMAL LOW (ref >60)
Glucose, Bld: 115 mg/dL — ABNORMAL HIGH (ref 70–99)
Potassium: 4.8 mmol/L (ref 3.5–5.1)
Sodium: 138 mmol/L (ref 135–145)
Total Bilirubin: 0.7 mg/dL (ref 0.3–1.2)
Total Protein: 6.1 g/dL — ABNORMAL LOW (ref 6.5–8.1)

## 2020-10-15 LAB — MAGNESIUM: Magnesium: 2.1 mg/dL (ref 1.7–2.4)

## 2020-10-15 MED ORDER — METOPROLOL TARTRATE 5 MG/5ML IV SOLN
2.5000 mg | INTRAVENOUS | Status: AC | PRN
  Administered 2020-10-15: 06:00:00 2.5 mg via INTRAVENOUS
  Filled 2020-10-15: qty 5

## 2020-10-15 MED ORDER — MAGIC MOUTHWASH W/LIDOCAINE
10.0000 mL | Freq: Four times a day (QID) | ORAL | Status: DC | PRN
  Filled 2020-10-15: qty 10

## 2020-10-15 NOTE — Progress Notes (Signed)
Patient scheduled for 1445 radiation treatment on L2 today. Patient refused radiation treatment yesterday thus phone to inquire about today. Received call back from [place] caring for patient today that the patient and his family have decided to transition to residential hospice and forego radiation therapy. Informed therapist on L2 of this finding and Dr. Tammi Klippel.

## 2020-10-15 NOTE — Progress Notes (Signed)
SLP Cancellation Note  Patient Details Name: Brett Medina MRN: [id number] DOB: 04-Dec-1960   Cancelled treatment:       Reason Eval/Treat Not Completed:  (per pt locator, pt at radiation at this time, will continue efforts)  Kathleen Lime, MS [facility] SLP [place] Office [phone] Pager [phone]   Macario Golds 10/15/2020, 6:41 PM

## 2020-10-15 NOTE — Progress Notes (Addendum)
CCMD called to inform this RN of patient HR to 180, and sustaining for longer periods of time. Cardiology paged and orders given to administer PRN Lopressor per parameters and monitor pt. Orders carried out and will continue to monitor patient closely.

## 2020-10-15 NOTE — Progress Notes (Signed)
Pt HR up to 170.  Pt resting quietly asymptomatic, states he can't feel heart racing.  Metoprolol 2.5 mg IV given.  EKG performed.  States A-fib RVR.  Ardith Dark NP notified.  Awaiting any new orders.

## 2020-10-15 NOTE — Plan of Care (Signed)
  Problem: Education: Goal: Knowledge of General Education information will improve Description: Including pain rating scale, medication(s)/side effects and non-pharmacologic comfort measures Outcome: Progressing   Problem: Clinical Measurements: Goal: Will remain free from infection Outcome: Progressing   Problem: Coping: Goal: Level of anxiety will decrease Outcome: Progressing   Problem: Elimination: Goal: Will not experience complications related to bowel motility Outcome: Progressing Goal: Will not experience complications related to urinary retention Outcome: Progressing   Problem: Pain Managment: Goal: General experience of comfort will improve Outcome: Progressing   Problem: Safety: Goal: Ability to remain free from injury will improve Outcome: Progressing   Problem: Skin Integrity: Goal: Risk for impaired skin integrity will decrease Outcome: Progressing   Problem: Self-Care: Goal: Verbalization of feelings and concerns over difficulty with self-care will improve Outcome: Progressing Goal: Ability to communicate needs accurately will improve Outcome: Progressing   Problem: Nutrition: Goal: Risk of aspiration will decrease Outcome: Progressing

## 2020-10-15 NOTE — TOC Progression Note (Addendum)
Transition of Care [facility]) - Progression Note    Patient Details  Name: Brett Medina MRN: [id number] Date of Birth: [birth year]  Transition of [facility]) CM/SW Contact  Martyna Thorns, Juliann Pulse, RN Phone Number: 10/15/2020, 1:21 PM  Clinical Narrative:   Faxed H&P,MD/oncology/palliative note w/confirmation to [facility] referral outcome if able to accept tel# [phone] fax# [phone]. 3p-[facility] about services.  3:50p-[facility] unable to provide residential hospice-there are no pain mgmnt,or iv therapy for symptom mgnt needed-these services an be done @ home. Will inform [name] of bes/tsafest/d/c plan -family will need to establish primary caregiver for home, dme needed.hospital bed/02/w/c,3n1.    Expected Discharge Plan: [place] Barriers to Discharge: Continued Medical Work up  Expected Discharge Plan and Services Expected Discharge Plan: [place] In-house Referral: Hospice / Palliative Care,Chaplain,Financial Counselor     Living arrangements for the past 2 months: Single Family Home                                       Social Determinants of Health (SDOH) Interventions    Readmission Risk Interventions No flowsheet data found.

## 2020-10-15 NOTE — Evaluation (Signed)
Occupational Therapy Evaluation Patient Details Name: Brett Medina MRN: [id number] DOB: 09-11-1961 Today's Date: 10/15/2020    History of Present Illness 59 y/o M presenting to ED on 11/28 for SOB that has gotten worse over past months. Found to have L chronic pleural effusions that continues to wrosen despite 11/29 and 12/1 L thorancentesis. CT on 11/28 also found R metastatic findings in R lung. 72/0 MRI with embolic shower with associated petechial hemorrhages in multiple vascular territories suggests cardiac source and possibly possibility of septic emboli.  Pt/family are considering residential hospice, but still in decision making process. PMHx: tobacco use and LVEF 40-45%.   Clinical Impression   Patient is currently requiring assistance with ADLs including Total assist with bed level toileting, Total Assist with bed level LE dressing, maximum assist with grooming, bathing, and UE dressing, all of which is below patient's typical baseline of being Independent prior to symptom onset.  During this evaluation, patient was limited by profoundly impaired activity tolerance with c/o feeling as if he could not breath at times and need of frequent rest breaks to coach in PLB and monitor HR.  Additional limitations include generalized weakness, and edemtaous extremities which has the potential to impact patient's safety and independence during functional mobility, as well as performance for ADLs. [place] "6-clicks" Daily Activity Inpatient Short Form score of 9/24 indicates 79.59% ADL impairment this session. Patient lives with his spouse, who is able to provide 24/7 supervision and assistance, however pt currently requires more assistance than his wife could safely provide alone.  Patient demonstrates fair rehab potential, and may benefit from continued skilled occupational therapy services while in acute care to maximize safety, independence and quality of life at home.  Discharge  recommednations are TBA based on pt and family discussions re: possible residential hospice care.  ?   Follow Up Recommendations  Other (comment);Supervision/Assistance - 24 hour (Pending based on pt/family's decision for hospice vs palliative.)    Equipment Recommendations  3 in 1 bedside commode    Recommendations for Other Services       Precautions / Restrictions Precautions Precautions: Fall Precaution Comments: Mon HR Restrictions Weight Bearing Restrictions: No      Mobility Bed Mobility Overal bed mobility: Needs Assistance Bed Mobility: Supine to Sit     Supine to sit: Mod assist;HOB elevated     General bed mobility comments: Attempted to roll onto side prior to sitting, but pt reported feeling as if he could not breath. Pt lowered back to bed and made 2nd attempt, assisting LEs off bed, then pt able to pull himself up with HHA/Min-Mod As.    Transfers Overall transfer level: Needs assistance   Transfers: Sit to/from Stand Sit to Stand: Mod assist         General transfer comment: Pt stood from partially elevted EOB to [facility] with multimodal cues and 3 count, with need of Mod As of 1 person.  Pt stood 2nd time from [employer] seat with Min As and lowered self down to bed with Mod As.    Balance Overall balance assessment: Needs assistance Sitting-balance support: Feet supported;Bilateral upper extremity supported Sitting balance-Leahy Scale: Fair     Standing balance support: Bilateral upper extremity supported;During functional activity Standing balance-Leahy Scale: Poor Standing balance comment: relies on UE support and external support                           ADL  either performed or assessed with clinical judgement   ADL Overall ADL's : Needs assistance/impaired                     Lower Body Dressing: Total assistance Lower Body Dressing Details (indicate cue type and reason): At bed level, pt encouraged to  attempt to tear open sock packet.  Packet placed in pt's hands as pt was unable to locate socks placed on his stomach despite verbal cues. Pt attempted to tear, but unable.  Total Assist to don socks at bed level.  Worked on pre-ADL mobility for training with standing LE dressing.  Please see mobility section. Toilet Transfer: [facility] Toilet Transfer Details (indicate cue type and reason): Pt on foley catheter. Pt worked on pre-t/f skills to prepare for [facility] transitions with use of Denna Haggard. Please see mobility  section. Toileting- Clothing Manipulation and Hygiene: Total assistance;+2 for physical assistance;+2 for safety/equipment;Sit to/from stand Toileting - Clothing Manipulation Details (indicate cue type and reason): Total Assist peri care with pt standing briefly with Denna Haggard.     Functional mobility during ADLs: Moderate assistance;Cueing for safety;Cueing for sequencing;+2 for safety/equipment       Vision         Perception     Praxis      Pertinent Vitals/Pain Pain Assessment: No/denies pain Faces Pain Scale:  (Pt denied pain, but endorsed feeling like he could not breath at times. See education.)     Hand Dominance Left   Extremity/Trunk Assessment             Communication Communication Communication: No difficulties   Cognition Arousal/Alertness: Awake/alert Behavior During Therapy: Flat affect Overall Cognitive Status: Impaired/Different from baseline                         Following Commands: Follows one step commands with increased time     Problem Solving: Slow processing;Decreased initiation;Difficulty sequencing;Requires verbal cues;Requires tactile cues General Comments: Pt slow to respond to commands, delayed processing of information and difficulty attending to tasks.  Poor safety awareness as well.   General Comments  Extremities are edematous.    Exercises Other Exercises Other Exercises: Pt required frequent rest breaks between  each transition and coached on pursed lip breathing and relaxed breathing.  HR 120s rest-144 after stand.   Shoulder Instructions      Home Living Family/patient expects to be discharged to:: Hospice/Palliative care (family in talks with pt and Hospice.) Living Arrangements: Spouse/significant other Available Help at Discharge: Family;Available 24 hours/day Type of Home: Mobile home Home Access: Stairs to enter Entrance Stairs-Number of Steps: 10 Entrance Stairs-Rails: Right Home Layout: One level     Bathroom Shower/Tub: Teacher, early years/pre: Standard     Home Equipment: None      Lives With: Spouse    Prior Functioning/Environment Level of Independence: Independent        Comments: DRives, works for [employer] from home        OT Problem List: Decreased strength;Cardiopulmonary status limiting activity;Increased edema;Decreased cognition;Decreased activity tolerance;Decreased safety awareness;Impaired balance (sitting and/or standing);Decreased knowledge of use of DME or AE;Decreased knowledge of precautions      OT Treatment/Interventions:      OT Goals(Current goals can be found in the care plan section) Acute Rehab OT Goals Patient Stated Goal: To eventually make it to the recliner. OT Goal Formulation: With patient/family Time For Goal Achievement: 10/29/20 Potential to  Achieve Goals: Fair ADL Goals Pt Will Perform Grooming: sitting;with min assist Pt Will Transfer to Toilet: bedside commode;stand pivot transfer;with min assist Pt/caregiver will Perform Home Exercise Program: Increased ROM;Increased strength;Both right and left upper extremity;With Supervision (Monitoring HR) Additional ADL Goal #1: Pt will engage in simple BUE activity while sitting EOB x at least 8 min with supervision and at least fair sitting balance in order to demonstrate improved activity tolerance and safety needed for seated ADLs  OT Frequency: Min 2X/week    Barriers to D/C:            Co-evaluation              AM-PAC OT "6 Clicks" Daily Activity     Outcome Measure Help from another person eating meals?: A Lot Help from another person taking care of personal grooming?: Total Help from another person toileting, which includes using toliet, bedpan, or urinal?: Total Help from another person bathing (including washing, rinsing, drying)?: A Lot Help from another person to put on and taking off regular upper body clothing?: A Lot Help from another person to put on and taking off regular lower body clothing?: Total 6 Click Score: 9   End of Session Equipment Utilized During Treatment: Gait belt;Other (comment) Nurse Communication: Mobility status;Need for lift equipment  Activity Tolerance: Patient limited by fatigue Patient left: in bed;with call bell/phone within reach;with bed alarm set;with family/visitor present  OT Visit Diagnosis: Unsteadiness on feet (R26.81);Muscle weakness (generalized) (M62.81)                Time: 4680-3212 OT Time Calculation (min): 30 min Charges:  OT General Charges $OT Visit: 1 Visit OT Evaluation $OT Eval Low Complexity: 1 Low OT Treatments $Self Care/Home Management : 8-22 mins  Anderson Malta, OT Acute Rehab Services Office: [phone] 10/15/2020  Julien Girt 10/15/2020, 2:48 PM

## 2020-10-15 NOTE — Progress Notes (Signed)
Pt HR continous to flucuate up to 170's, New order received from M. Sharlet Salina NP, see MAR.

## 2020-10-15 NOTE — Progress Notes (Signed)
Patient ID: Brett Medina, male   DOB: 1961-01-16, 59 y.o.   MRN: [id number]  PROGRESS NOTE    Brett Medina  PPJ:[id number] DOB: 1961/09/03 DOA: 09/29/2020 PCP: Patient, No Pcp Per   Brief Narrative:  59 year old male with history of tobacco abuse presented with ongoing worsening shortness of breath and bilateral lower extremity swelling.  Work-up with chest CT showed constellation of findings consistent with metastatic malignancy, moderate to large volume left pleural effusion with almost complete collapse of left upper and lower lobes.  He underwent thoracentesis on 09/30/2020 by IR with 950 cc of dark bloody fluid removed.  oncology/radiation oncology were consulted. Cardiology was consulted for elevated troponin and tachycardia.  Echo showed EF of 40 to 45% with global left ventricular hypokinesis. On 10/01/2020, chest x-ray showed recurrent large left pleural effusion and IR was consulted for repeat left thoracentesis. Palliative care was consulted for goals of care discussion because of overall very poor prognosis. He was transferred to [facility] for initiation of palliative radiation.  Assessment & Plan:   Acute respiratory failure with hypoxia New diagnosis of metastatic non-small cell lung cancer Large left pleural effusion status post thoracentesis -Currently on 3 L oxygen via nasal cannula. -Has had left-sided thoracentesis done during this hospitalization -Status post EBUS/bronchoscopy by PCCM.  Continue Xopenex, incentive spirometry, flutter valve -Oncology/radiation oncology following.  Patient was transferred to [facility] long to initiate palliative radiation starting 10/14/2020.  However patient refused palliative radiation. -Overall prognosis is very poor.  Palliative care following.  Family is leaning towards residential hospice.  Continue mirtazapine.  Will not check labs for tomorrow and wait for palliative care meeting with family today.  Acute metabolic  encephalopathy Numerous acute/subacute embolic infarcts versus?  Metastases -MRI brain showed above, not convincing for metastases -A1c 5.7, LDL 73 -Bilateral lower extremity duplex ultrasound was negative for DVT -Neurology evaluated the patient: Poor anticoagulation candidate, no further work-up.  Hold off on further anticoagulation/antiplatelet for now, continue aspirin.  Lipitor discontinued -Unable to perform TEE due to poor anticoagulation candidate and this patient is possibly transitioning to palliative care/hospice  Possible MAT/atrial flutter -Still intermittently tachycardic.  Cardiology following.  Currently on metoprolol and digoxin.  Hold off on anticoagulation because of patient heading towards hospice  Acute systolic CHF Elevated troponin, suspect demand ischemia Nonobstructive CAD -Echo showed EF of 40 to 45% -LHC revealed moderate diffuse LAD stenosis and 40% D2 lesion: medical management recommended -LDL 73 -Cardiology on board -Continue aspirin and metoprolol.  Losartan held because of soft blood pressure. -Daily weights, start strict I&O  Leukocytosis -Reactive.  Completed 7 days of IV Rocephin and Zithromax.  AKI -Creatinine stable currently.  Mild transaminitis -Off Lipitor  ?  UTI Enterococcus faecalis -Completed antibiotics    DVT prophylaxis: Lovenox Code Status: DNR Family Communication: Wife and daughter at bedside Disposition Plan: Status is: Inpatient  Remains inpatient appropriate because: Patient is extremely ill and deconditioned   Dispo:  Patient From:  Home  Planned Disposition: Possible residential hospice  Expected discharge date: 10/16/2020  Medically stable for discharge:  No   Consultants: PCCM/cardiology/IR/neurology/oncology/radiation oncology  Procedures:  11/30  LEFT thoracentesis aspirated 950 cc fluid 12/1 LEFT thoracentesis; aspirated 653ml maroon-colored fluid 2D echo 12/3 LEFT heart cath;  Antimicrobials:   Anti-infectives (From admission, onward)   Start     Dose/Rate Route Frequency Ordered Stop   10/05/20 1615  amoxicillin (AMOXIL) capsule 500 mg  Status:  Discontinued        500  mg Oral Every 8 hours 10/05/20 1528 10/08/20 1028   10/05/20 1445  cefTRIAXone (ROCEPHIN) 2 g in sodium chloride 0.9 % 100 mL IVPB  Status:  Discontinued        2 g 200 mL/hr over 30 Minutes Intravenous Every 24 hours 10/05/20 1354 10/05/20 1527   09/29/20 1630  cefTRIAXone (ROCEPHIN) 2 g in sodium chloride 0.9 % 100 mL IVPB  Status:  Discontinued        2 g 200 mL/hr over 30 Minutes Intravenous Every 24 hours 09/29/20 1624 09/29/20 2226   09/29/20 1630  azithromycin (ZITHROMAX) 500 mg in sodium chloride 0.9 % 250 mL IVPB  Status:  Discontinued        500 mg 250 mL/hr over 60 Minutes Intravenous Every 24 hours 09/29/20 1624 09/29/20 2226       Subjective: Patient seen and examined at bedside.  Denies any current pain.  Wife and daughter present at bedside.  Denies worsening shortness of breath.  No overnight fever or vomiting reported.  Objective: Vitals:   10/14/20 2109 10/15/20 0500 10/15/20 0508 10/15/20 0838  BP: (!) 130/102  122/77 114/83  Pulse: (!) 41  (!) 55 84  Resp: 20  20 16   Temp:   98.3 F (36.8 C) 97.6 F (36.4 C)  TempSrc:   Oral Oral  SpO2: 96%  97% 96%  Weight:  89.2 kg    Height:        Intake/Output Summary (Last 24 hours) at 10/15/2020 1059 Last data filed at 10/15/2020 0510 Gross per 24 hour  Intake --  Output 500 ml  Net -500 ml   Filed Weights   10/13/20 0500 10/14/20 0400 10/15/20 0500  Weight: 86.2 kg 85.7 kg 89.2 kg    Examination:  General exam: Looks extremely deconditioned and chronically ill.  No distress.  On 3 L oxygen via nasal cannula Respiratory system: Bilateral decreased breath sounds at bases with scattered crackles Cardiovascular system: S1 & S2 heard, intermittently bradycardic and tachycardic gastrointestinal system: Abdomen is nondistended, soft  and nontender. Normal bowel sounds heard. Extremities: No cyanosis, clubbing; 1-2+ edema present Central nervous system: Alert and oriented. No focal neurological deficits. Moving extremities Skin: No rashes, lesions or ulcers Psychiatry: Flat affect.   Data Reviewed: I have personally reviewed following labs and imaging studies  CBC: Recent Labs  Lab 10/11/20 0134 10/12/20 0126 10/13/20 0041 10/14/20 0822 10/15/20 0520  WBC 11.5* 11.5* 13.8* 14.9* 15.6*  NEUTROABS 9.1* 9.1* 11.4* 12.4* 13.1*  HGB 11.3* 12.0* 11.5* 11.6* 12.2*  HCT 35.3* 38.6* 37.4* 36.8* 39.6  MCV 78.8* 79.8* 79.7* 79.3* 81.3  PLT 158 151 151 163 329   Basic Metabolic Panel: Recent Labs  Lab 10/09/20 0208 10/10/20 0133 10/11/20 0134 10/12/20 0126 10/13/20 0041 10/14/20 0822 10/15/20 0520  NA 136 137 136 136 134* 135 138  K 4.4 4.5 4.2 4.5 4.5 4.6 4.8  CL 101 102 102 103 102 100 103  CO2 22 27 25 22 23 22 23   GLUCOSE 103* 110* 108* 84 111* 111* 115*  BUN 21* 24* 21* 20 26* 29* 35*  CREATININE 1.33* 1.52* 1.36* 1.35* 1.56* 1.54* 1.64*  CALCIUM 8.5* 8.6* 8.5* 8.5* 8.5* 8.3* 8.4*  MG 2.1 1.9 2.0 2.0 2.0  --  2.1  PHOS 3.8 3.3 3.7 3.7 3.8  --   --    GFR: Estimated Creatinine Clearance: 54.2 mL/min (A) (by C-G formula based on SCr of 1.64 mg/dL (H)). Liver Function Tests: Recent Labs  Lab 10/11/20 0134 10/12/20 0126 10/13/20 0041 10/14/20 0822 10/15/20 0520  AST 53* 59* 70* 84* 94*  ALT 43 41 43 48* 54*  ALKPHOS 195* 216* 254* 410* 373*  BILITOT 1.0 0.8 0.8 0.9 0.7  PROT 5.4* 5.5* 5.5* 5.4* 6.1*  ALBUMIN 2.1* 2.2* 2.1* 2.1* 2.4*   No results for input(s): LIPASE, AMYLASE in the last 168 hours. No results for input(s): AMMONIA in the last 168 hours. Coagulation Profile: No results for input(s): INR, PROTIME in the last 168 hours. Cardiac Enzymes: No results for input(s): CKTOTAL, CKMB, CKMBINDEX, TROPONINI in the last 168 hours. BNP (last 3 results) No results for input(s): PROBNP in  the last 8760 hours. HbA1C: No results for input(s): HGBA1C in the last 72 hours. CBG: No results for input(s): GLUCAP in the last 168 hours. Lipid Profile: No results for input(s): CHOL, HDL, LDLCALC, TRIG, CHOLHDL, LDLDIRECT in the last 72 hours. Thyroid Function Tests: No results for input(s): TSH, T4TOTAL, FREET4, T3FREE, THYROIDAB in the last 72 hours. Anemia Panel: No results for input(s): VITAMINB12, FOLATE, FERRITIN, TIBC, IRON, RETICCTPCT in the last 72 hours. Sepsis Labs: No results for input(s): PROCALCITON, LATICACIDVEN in the last 168 hours.  Recent Results (from the past 240 hour(s))  Surgical PCR screen     Status: None   Collection Time: 10/08/20  8:51 AM   Specimen: Nasal Mucosa; Nasal Swab  Result Value Ref Range Status   MRSA, PCR NEGATIVE NEGATIVE Final   Staphylococcus aureus NEGATIVE NEGATIVE Final    Comment: (NOTE) The Xpert SA Assay (FDA approved for NASAL specimens in patients 10 years of age and older), is one component of a comprehensive surveillance program. It is not intended to diagnose infection nor to guide or monitor treatment. Performed at [facility], [place] [address], [place], [place] [address]          Radiology Studies: No results found.      Scheduled Meds: . aspirin EC  81 mg Oral Daily  . digoxin  0.25 mg Oral Daily  . enoxaparin (LOVENOX) injection  40 mg Subcutaneous Q24H  . guaiFENesin-codeine  10 mL Oral QHS  . mouth rinse  15 mL Mouth Rinse BID  . metoprolol tartrate  100 mg Oral BID  . mirtazapine  15 mg Oral QHS  . sodium chloride flush  3 mL Intravenous Q12H  . sodium chloride flush  3 mL Intravenous Q12H   Continuous Infusions: . sodium chloride            Aline August, MD [facility] 10/15/2020, 10:59 AM

## 2020-10-16 ENCOUNTER — Ambulatory Visit: Payer: Self-pay

## 2020-10-16 DIAGNOSIS — I214 Non-ST elevation (NSTEMI) myocardial infarction: Secondary | ICD-10-CM

## 2020-10-16 MED ORDER — MIRTAZAPINE 15 MG PO TBDP: 15.0000 mg | ORAL_TABLET | Freq: Every day | ORAL | 0 refills | Status: AC

## 2020-10-16 MED ORDER — POLYETHYLENE GLYCOL 3350 17 G PO PACK: 17.0000 g | PACK | Freq: Every day | ORAL | 0 refills | Status: AC | PRN

## 2020-10-16 MED ORDER — MORPHINE SULFATE (PF) 2 MG/ML IV SOLN
2.0000 mg | INTRAVENOUS | Status: DC | PRN
  Administered 2020-10-16: 2 mg via INTRAVENOUS
  Filled 2020-10-16: qty 1

## 2020-10-16 MED ORDER — DIGOXIN 250 MCG PO TABS: 0.2500 mg | ORAL_TABLET | Freq: Every day | ORAL | 0 refills | Status: AC

## 2020-10-16 MED ORDER — LORAZEPAM 1 MG PO TABS: 1.0000 mg | ORAL_TABLET | Freq: Three times a day (TID) | ORAL | 0 refills | Status: AC | PRN

## 2020-10-16 MED ORDER — GUAIFENESIN-CODEINE 100-10 MG/5ML PO SOLN: 10.0000 mL | Freq: Four times a day (QID) | ORAL | 0 refills | Status: AC | PRN

## 2020-10-16 MED ORDER — METOPROLOL TARTRATE 100 MG PO TABS: 100.0000 mg | ORAL_TABLET | Freq: Two times a day (BID) | ORAL | 0 refills | Status: AC

## 2020-10-16 MED ORDER — HYDROCODONE-ACETAMINOPHEN 5-325 MG PO TABS: 1.0000 | ORAL_TABLET | Freq: Four times a day (QID) | ORAL | 0 refills | Status: AC | PRN

## 2020-10-16 MED ORDER — METOPROLOL TARTRATE 5 MG/5ML IV SOLN
2.5000 mg | INTRAVENOUS | Status: AC | PRN
  Administered 2020-10-16: 04:00:00 2.5 mg via INTRAVENOUS

## 2020-10-16 NOTE — Discharge Summary (Signed)
Physician Discharge Summary  JIVAN SYMANSKI ELF:[id number] DOB: 1961/08/20 DOA: 09/29/2020  PCP: Patient, No Pcp Per  Admit date: 09/29/2020 Discharge date: 10/16/2020  Admitted From: Home Disposition: Home with hospice  Recommendations for Outpatient Follow-up:  Follow up with home hospice at earliest [place]: Home with hospice Equipment/Devices: Supplemental oxygen   Discharge Condition: Poor CODE STATUS: DNR Diet recommendation: As per comfort measures  Brief/Interim Summary: 59 year old male with history of tobacco abuse presented with ongoing worsening shortness of breath and bilateral lower extremity swelling.  Work-up with chest CT showed constellation of findings consistent with metastatic malignancy, moderate to large volume left pleural effusion with almost complete collapse of left upper and lower lobes.  He underwent thoracentesis on 09/30/2020 by IR with 950 cc of dark bloody fluid removed.  oncology/radiation oncology were consulted. Cardiology was consulted for elevated troponin and tachycardia.  Echo showed EF of 40 to 45% with global left ventricular hypokinesis. On 10/01/2020, chest x-ray showed recurrent large left pleural effusion and IR was consulted for repeat left thoracentesis. Palliative care was consulted for goals of care discussion because of overall very poor prognosis. He was transferred to [facility] for initiation of palliative radiation. He refused radiation treatment.  After discussion with palliative care team, he decided to proceed with comfort measures/hospice.  He will be discharged to home with hospice once arrangements have been made.  Discharge Diagnoses:   Acute respiratory failure with hypoxia New diagnosis of metastatic non-small cell lung cancer Large left pleural effusion status post thoracentesis Acute metabolic encephalopathy Numerous acute/subacute embolic infarcts versus?  Metastases Possible MAT/atrial  flutter Acute systolic CHF Elevated troponin, suspect demand ischemia Nonobstructive CAD Leukocytosis AKI Mild transaminitis ?  UTI Enterococcus faecalis  Plan -After discussion with palliative care team, he decided to proceed with comfort measures/hospice.   He will be discharged to home with hospice once arrangements have been made.  Continue rate controlling agents for now which might help with comfort but if patient is unable to take his oral medications, these can be discontinued all together at home by hospice  Discharge Instructions  Discharge Instructions    Diet general   Complete by: As directed    As per comfort measures   Increase activity slowly   Complete by: As directed      Allergies as of 10/16/2020      Reactions   Contrast Media [iodinated Diagnostic Agents]    Flushing, warmth, even with pre-medication      Medication List    STOP taking these medications   doxycycline 100 MG tablet Commonly known as: VIBRA-TABS     TAKE these medications   albuterol 108 (90 Base) MCG/ACT inhaler Commonly known as: VENTOLIN HFA Inhale 2 puffs into the lungs every 6 (six) hours as needed.   benzonatate 100 MG capsule Commonly known as: TESSALON Take 1 capsule by mouth every 6 (six) hours as needed for cough.   digoxin 0.25 MG tablet Commonly known as: LANOXIN Take 1 tablet (0.25 mg total) by mouth daily. Start taking on: October 17, 2020   guaiFENesin-codeine 100-10 MG/5ML syrup Take 10 mLs by mouth every 6 (six) hours as needed for cough.   HYDROcodone-acetaminophen 5-325 MG tablet Commonly known as: NORCO/VICODIN Take 1 tablet by mouth every 6 (six) hours as needed for moderate pain.   LORazepam 1 MG tablet Commonly known as: Ativan Take 1 tablet (1 mg total) by mouth every 8 (eight) hours as needed for up to  5 days for anxiety.   metoprolol tartrate 100 MG tablet Commonly known as: LOPRESSOR Take 1 tablet (100 mg total) by mouth 2 (two) times  daily.   mirtazapine 15 MG disintegrating tablet Commonly known as: REMERON SOL-TAB Take 1 tablet (15 mg total) by mouth at bedtime.   polyethylene glycol 17 g packet Commonly known as: MIRALAX / GLYCOLAX Take 17 g by mouth daily as needed for mild constipation.       Follow-up Information    Home hospice Follow up.   Why: at earliest convenience             Allergies  Allergen Reactions  . Contrast Media [Iodinated Diagnostic Agents]     Flushing, warmth, even with pre-medication    Consultations: PCCM/cardiology/IR/neurology/oncology/radiation oncology   Procedures/Studies: CT ABDOMEN PELVIS WO CONTRAST  Result Date: 10/02/2020 CLINICAL DATA:  59 year old male with cancer of unknown primary. EXAM: CT CHEST, ABDOMEN AND PELVIS WITHOUT CONTRAST TECHNIQUE: Multidetector CT imaging of the chest, abdomen and pelvis was performed following the standard protocol without IV contrast. COMPARISON:  Chest radiograph dated 10/02/2020. chest CT dated 09/29/2020. FINDINGS: Evaluation of this exam is limited in the absence of intravenous contrast. CT CHEST FINDINGS Cardiovascular: There is no cardiomegaly or pericardial effusion. The thoracic aorta and central pulmonary arteries are grossly unremarkable. Mediastinum/Nodes: Mildly enlarged right paratracheal lymph node measures 12 mm. The esophagus and the thyroid gland are grossly unremarkable. No mediastinal fluid collection. Lungs/Pleura: There is a 10.0 x 7.5 x 12.0 cm mass in the left upper lobe. There is diffuse interstitial coarsening and nodularity of the left lung with smaller scattered nodules concerning for metastatic spread. There is a small left pleural effusion, likely malignant effusion. There is consolidative changes of the majority of the left lung which may be combination of atelectasis, infiltrate, or metastasis. Several scattered nodules in the right lung measure up to 1 cm consistent with metastatic disease. There is a  patchy area of nodular and ground-glass density in the right infrahilar region which may represent pneumonia, or aspiration. Metastasis is not excluded. There is background of moderate to severe centrilobular emphysema. No pneumothorax. There is compression and high-grade narrowing of the left lower lobe bronchus and occlusion of the left upper lobe bronchus by the left lung mass. There is apparent nodular thickening of the left pleural consistent with metastatic implant. Musculoskeletal: There is sclerotic lesion in the T6 as well as sclerotic changes of C7. Small sclerotic focus in T9. CT ABDOMEN PELVIS FINDINGS No intra-abdominal free air or free fluid. Hepatobiliary: Multiple hepatic hypodense lesions consistent with metastatic disease. These measure up to 2.5 cm in the left lobe of the liver. 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 No calcified gallstone or pericholecystic fluid. Pancreas: The pancreas is grossly unremarkable. Spleen: Heterogeneous appearance of the spleen. Adrenals/Urinary Tract: The adrenal glands are unremarkable. Multiple nonobstructing bilateral renal calculi measure up to 8 mm in the inferior pole of the right kidney. There is no hydronephrosis on either side. There is a 6 cm right renal cyst. The visualized ureters and urinary bladder appear unremarkable. Stomach/Bowel: There is circumferential thickening of the rectosigmoid involving approximately 9 cm segment (95/8) which may be inflammatory in etiology. There is however apparent shouldering of the soft tissue (sagittal 93/8) concerning for underlying malignancy. Clinical correlation and further evaluation with sigmoidoscopy or colonoscopy is recommended. There is moderate stool throughout the colon. There is colonic diverticulosis without active inflammatory changes. There is no bowel obstruction. The  appendix is normal. Vascular/Lymphatic: The abdominal aorta and IVC unremarkable. No portal venous gas. No  adenopathy. Reproductive: The prostate and seminal vesicles are grossly unremarkable. Other: Subcutaneous edema of the lateral pelvic wall. No fluid collection. Musculoskeletal: Faint irregular lucency involving the left superior pubic ramus concerning for metastatic disease. Several scattered small sclerotic lesions also noted involving the right ischium, sacral bone also concerning for metastatic disease. No acute fracture. IMPRESSION: 1. Large left upper lobe mass with findings of metastatic disease in the lungs, liver, and bones. 2. Circumferential thickening of the rectosigmoid with apparent shouldering concerning for underlying malignancy. Clinical correlation and further evaluation with sigmoidoscopy or colonoscopy is recommended. 3. Nonobstructing bilateral renal calculi. No hydronephrosis. 4. Colonic diverticulosis. No bowel obstruction. Normal appendix. 5. Aortic Atherosclerosis (ICD10-I70.0) and Emphysema (ICD10-J43.9). Electronically Signed   By: Anner Crete M.D.   On: 10/02/2020 19:46   DG Chest 1 View  Result Date: 10/02/2020 CLINICAL DATA:  Status post thoracentesis EXAM: CHEST  1 VIEW COMPARISON:  October 01, 2020 FINDINGS: No pneumothorax. There remains extensive opacity throughout the left lung, largely due to airspace consolidation but with questionable residual pleural effusion. Ill-defined opacity right base is stable. No new opacity evident. Stable cardiac prominence. Pulmonary vascular on the right appears unremarkable. Pulmonary vascularity on the left is obscured. No bone lesions. IMPRESSION: No pneumothorax. Opacification of most of the left hemithorax remains, likely due to consolidation with questionable residual pleural effusion. Suspect patchy pneumonia right base, stable. No new opacity evident. Cardiac silhouette appears stable. Electronically Signed   By: Lowella Grip III M.D.   On: 10/02/2020 08:50   DG Chest 1 View  Result Date: 09/30/2020 CLINICAL DATA:  Post  thoracentesis EXAM: CHEST  1 VIEW COMPARISON:  09/29/2020 FINDINGS: Slightly decreased, still large left pleural effusion. Some improvement in left lung aeration primarily at the apex. No pneumothorax. Stable nodule overlying the mid right lung. Unchanged mild right basilar interstitial prominence. Similar cardiomediastinal contours. IMPRESSION: Some improvement of left lung aeration after thoracentesis. No pneumothorax. Persistent large left pleural effusion with associated atelectasis and possible underlying consolidation. Unchanged right basilar interstitial prominence and right mid lung nodule. Electronically Signed   By: Macy Mis M.D.   On: 09/30/2020 09:48   DG Chest 2 View  Result Date: 10/08/2020 CLINICAL DATA:  Pleural effusion. EXAM: CHEST - 2 VIEW COMPARISON:  10/02/2020. FINDINGS: Worsening, now complete opacification of the left lung. Similar right perihilar and right basilar opacities. Cardiomediastinal silhouette is largely obscured. No acute osseous abnormality. No visible pneumothorax. IMPRESSION: 1. Worsening, now complete opacification of the left lung that is concerning for a combination of pleural effusion and consolidation. 2. Similar right perihilar and right basilar opacities. Electronically Signed   By: Margaretha Sheffield MD   On: 10/08/2020 07:59   DG Chest 2 View  Result Date: 09/29/2020 CLINICAL DATA:  Shortness of breath EXAM: CHEST - 2 VIEW COMPARISON:  October 27, 2004 FINDINGS: There is essentially complete consolidation of the left hemithorax with probable combination of pleural effusion and extensive airspace consolidation throughout the left lung. There is a nodular opacity in the periphery of the right mid lung measuring 1.0 x 0.9 cm. There is interstitial prominence on the right which in part may be due to redistribution of blood flow to viable lung segments. Heart size appears grossly normal. Pulmonary vascularity on the right is normal. No adenopathy is evident  in areas that can be assessed for potential adenopathy by radiography. Note that opacification on  the left precludes assessment for potential adenopathy on the left. No bone lesions. IMPRESSION: Widespread airspace opacity on the left, likely due to combination of consolidation and pleural effusion. Underlying neoplasm on the left cannot be excluded. Given this circumstance, correlation with contrast enhanced chest CT may well be advisable at this time to further evaluate Nodular opacity periphery of right mid lung concerning for potential small neoplastic focus. Interstitial prominence on the right may in large part be due to redistribution of blood flow to viable lung segments. Heart size within normal limits. Electronically Signed   By: Lowella Grip III M.D.   On: 09/29/2020 16:13   CT HEAD WO CONTRAST  Result Date: 10/02/2020 CLINICAL DATA:  Cancer of unknown primary EXAM: CT HEAD WITHOUT CONTRAST TECHNIQUE: Contiguous axial images were obtained from the base of the skull through the vertex without intravenous contrast. COMPARISON:  None. FINDINGS: Brain: Multiple acquisitions required due to patient motion artifact. Hypoattenuating foci are seen subcortical white matter of the right frontal lobe (8/8 and right insula (8/1) without significant surrounding vasogenic edema. Additional hyperattenuating focus seen near the gray-white interface of the high left frontal lobe (10/40). With some questionable surrounding vasogenic edema. There is a background of chronic microvascular angiopathy and parenchymal volume loss. No other acute intracranial abnormality is seen. No significant mass effect or midline shift is evident at this time. Vascular: Atherosclerotic calcification of the carotid siphons and intradural vertebral arteries. No hyperdense vessel. Skull: No calvarial fracture or suspicious osseous lesion. No scalp swelling or hematoma. Sinuses/Orbits: Paranasal sinuses and mastoid air cells are  predominantly clear. Included orbital structures are unremarkable. Other: None. IMPRESSION: Severely motion degraded imaging despite multiple attempts at acquisition. Ill-defined hyperattenuating focus near the gray-white junction in the high left frontal lobe with some questionable surrounding hypoattenuation, possibly vasogenic edema raises concern for potential metastatic focus in the setting of cancer of unknown primary. Consider further evaluation with contrast enhanced MRI as patient is able to tolerate. May require sedation given extensive motion artifact on this CT imaging. Hypoattenuating foci in the right frontal lobe and insula, could reflect remote sequela of prior ischemia or infarct. Though could be better assessed on MR imaging as well. Electronically Signed   By: Lovena Le M.D.   On: 10/02/2020 19:37   CT CHEST WO CONTRAST  Result Date: 10/02/2020 CLINICAL DATA:  59 year old male with cancer of unknown primary. EXAM: CT CHEST, ABDOMEN AND PELVIS WITHOUT CONTRAST TECHNIQUE: Multidetector CT imaging of the chest, abdomen and pelvis was performed following the standard protocol without IV contrast. COMPARISON:  Chest radiograph dated 10/02/2020. chest CT dated 09/29/2020. FINDINGS: Evaluation of this exam is limited in the absence of intravenous contrast. CT CHEST FINDINGS Cardiovascular: There is no cardiomegaly or pericardial effusion. The thoracic aorta and central pulmonary arteries are grossly unremarkable. Mediastinum/Nodes: Mildly enlarged right paratracheal lymph node measures 12 mm. The esophagus and the thyroid gland are grossly unremarkable. No mediastinal fluid collection. Lungs/Pleura: There is a 10.0 x 7.5 x 12.0 cm mass in the left upper lobe. There is diffuse interstitial coarsening and nodularity of the left lung with smaller scattered nodules concerning for metastatic spread. There is a small left pleural effusion, likely malignant effusion. There is consolidative changes of the  majority of the left lung which may be combination of atelectasis, infiltrate, or metastasis. Several scattered nodules in the right lung measure up to 1 cm consistent with metastatic disease. There is a patchy area of nodular and ground-glass density in the  right infrahilar region which may represent pneumonia, or aspiration. Metastasis is not excluded. There is background of moderate to severe centrilobular emphysema. No pneumothorax. There is compression and high-grade narrowing of the left lower lobe bronchus and occlusion of the left upper lobe bronchus by the left lung mass. There is apparent nodular thickening of the left pleural consistent with metastatic implant. Musculoskeletal: There is sclerotic lesion in the T6 as well as sclerotic changes of C7. Small sclerotic focus in T9. CT ABDOMEN PELVIS FINDINGS No intra-abdominal free air or free fluid. Hepatobiliary: Multiple hepatic hypodense lesions consistent with metastatic disease. These measure up to 2.5 cm in the left lobe of the liver. 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 No calcified gallstone or pericholecystic fluid. Pancreas: The pancreas is grossly unremarkable. Spleen: Heterogeneous appearance of the spleen. Adrenals/Urinary Tract: The adrenal glands are unremarkable. Multiple nonobstructing bilateral renal calculi measure up to 8 mm in the inferior pole of the right kidney. There is no hydronephrosis on either side. There is a 6 cm right renal cyst. The visualized ureters and urinary bladder appear unremarkable. Stomach/Bowel: There is circumferential thickening of the rectosigmoid involving approximately 9 cm segment (95/8) which may be inflammatory in etiology. There is however apparent shouldering of the soft tissue (sagittal 93/8) concerning for underlying malignancy. Clinical correlation and further evaluation with sigmoidoscopy or colonoscopy is recommended. There is moderate stool throughout the colon. There is  colonic diverticulosis without active inflammatory changes. There is no bowel obstruction. The appendix is normal. Vascular/Lymphatic: The abdominal aorta and IVC unremarkable. No portal venous gas. No adenopathy. Reproductive: The prostate and seminal vesicles are grossly unremarkable. Other: Subcutaneous edema of the lateral pelvic wall. No fluid collection. Musculoskeletal: Faint irregular lucency involving the left superior pubic ramus concerning for metastatic disease. Several scattered small sclerotic lesions also noted involving the right ischium, sacral bone also concerning for metastatic disease. No acute fracture. IMPRESSION: 1. Large left upper lobe mass with findings of metastatic disease in the lungs, liver, and bones. 2. Circumferential thickening of the rectosigmoid with apparent shouldering concerning for underlying malignancy. Clinical correlation and further evaluation with sigmoidoscopy or colonoscopy is recommended. 3. Nonobstructing bilateral renal calculi. No hydronephrosis. 4. Colonic diverticulosis. No bowel obstruction. Normal appendix. 5. Aortic Atherosclerosis (ICD10-I70.0) and Emphysema (ICD10-J43.9). Electronically Signed   By: Anner Crete M.D.   On: 10/02/2020 19:46   CT Chest Wo Contrast  Result Date: 09/29/2020 CLINICAL DATA:  Chest pain and shortness of breath worsening for the past 2 months. Evaluation for malignancy, infection, mass, fluid. EXAM: CT CHEST WITHOUT CONTRAST TECHNIQUE: Multidetector CT imaging of the chest was performed following the standard protocol without IV contrast. COMPARISON:  Chest x-ray 09/29/2020, chest x-ray 10/27/2004 FINDINGS: Cardiovascular: Normal heart size. Trace pericardial effusion. The aortic root appears to be normal in caliber. Suggestion of possible mild aortic valve leaflet calcifications. The ascending thoracic aorta is enlarged in caliber measuring up to 4.5 cm. The descending thoracic aorta is normal in caliber. Mild  atherosclerotic plaque of the thoracic aorta. Possible mild left anterior descending coronary artery calcification. Mediastinum/Nodes: There is a 1.1 cm infra cardiac enlarged round lymph node (2:127). Enlarged mediastinal lymph nodes with as an example a right 1 cm paratracheal lymph node (2: 44) and a enlarged 1.1 cm subcarinal lymph node (2:65). Limited sensitivity for the detection of hilar adenopathy on this noncontrast study. No axillary lymphadenopathy bilaterally. Lungs/Pleura: At least mild paraseptal and severe centrilobular emphysematous changes. Almost complete collapse of the left  upper and lower lobes. There is a 1 cm solid round pulmonary nodule within the right middle lobe (4:83,99). Slightly more inferior, there there are a 0.6 cm right middle lobe pulmonary nodules (4:89). Several pulmonary micronodules at the right apex (4:17, 24, 31). Couple of round pulmonary nodule within the right lower lobe measuring 0.5 and 0.6 cm (4:1 16-117). Ground-glass subpleural nodule measuring 0.7 cm in the right middle lobe (4:96). Peribronchovascular nodular like 3.2 cm ground-glass airspace opacity within the right lower lobe (4:111). Diffuse mild bronchial wall thickening. Moderate to large volume simple fluid left pleural effusion. No right pleural effusion. No pneumothorax. Upper Abdomen: Suggestion of a partially visualized splenule. Otherwise no acute abnormality. Musculoskeletal: Indeterminate 1 cm subcutaneus soft tissue density within the left anterolateral back at the level of scapula (2:55). Several other subcentimeter soft tissue densities within the left lower back (2:68, 99). Possible seroma findings that is collimated off view within the right anterolateral back (2:52). Sclerotic thoracic vertebra lesions within the T6, T9, T11, T12 vertebral bodies. Possible tiny sclerotic foci within the left aspect of the T7 vertebral body (6:103). Similarly within the left aspect of the T4 and T5 vertebral  bodies. Sclerotic lesion within the right transverse process of the T3 vertebral body (4:10). No acute displaced fracture. Multilevel mild degenerative changes of the spine. IMPRESSION: 1. Constellation of findings consistent with metastatic malignancy. 2. Moderate to large volume left pleural effusion with almost complete collapse of the left upper and lower lobes. Associated mediastinal lymphadenopathy and limited evaluation for hilar lymphadenopathy on this noncontrast study. Underlying malignancy suspected (ddx includes metastasis or primary lung). Consider analysis of the pleural fluid for further evaluation. 3. Indeterminate multifocal right lung pulmonary nodules measuring up to 1 cm in the right middle lobe. 4. Indeterminate 3.2 cm peribronchovascular ground-glass airspace opacity within the right lower lobe. 5. Scattered suspicious sclerotic lesions concerning for osseous metastases. 6. Indeterminate pericentimeter subcutaneus soft tissue densities within the bilateral anterolateral back at the level of scapulas. 7. Severe emphysematous changes. 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 9. Aortic Atherosclerosis (ICD10-I70.0) and Emphysema (ICD10-J43.9). Electronically Signed   By: Iven Finn M.D.   On: 09/29/2020 17:19   MR BRAIN WO CONTRAST  Addendum Date: 10/09/2020   ADDENDUM REPORT: 10/09/2020 16:31 ADDENDUM: Study discussed by telephone with Dr. Lesia Sago on 10/09/2020 at 1620 hours. Electronically Signed   By: Genevie Ann M.D.   On: 10/09/2020 16:31   Result Date: 10/09/2020 CLINICAL DATA:  59 year old male with altered mental status, confusion. Metastatic disease unknown primary, left lung biopsy results pending. EXAM: MRI HEAD WITHOUT CONTRAST TECHNIQUE: Multiplanar, multiecho pulse sequences of the brain and surrounding structures were obtained without intravenous contrast. COMPARISON:  Head CT 10/02/2020. CT Chest, Abdomen, and Pelvis 10/02/2020. FINDINGS: Brain:  Widespread abnormal restricted diffusion throughout the brain. Lesions range from punctate to patchy, confluent areas. Superior frontal and parietal lobes among those most severely affected (series 5, image 93). Largest diffusion restricted lesion is in the left occipital pole, 3 cm (series 5, image 79). Bilateral cerebellar involvement greater on the left (2.4 cm). Left basal ganglia affected. Other deep gray nuclei and brainstem appear spared. On T2 and FLAIR imaging these areas appear is gyriform and subcortical hyperintense signal in keeping with cytotoxic edema. Some areas demonstrate central cystic change and appear more subacute (series 5, image 90). Occasional rounded areas of signal abnormality are noted (left cerebellum series 11, image 5). SWI demonstrates microhemorrhage at many of the  affected sites. And also chronic superimposed microhemorrhage such as at the left dorsal thalamus on series 14 image 23. No malignant hemorrhagic transformation. No extra-axial or intraventricular blood. No ventriculomegaly. No intracranial mass effect or midline shift. Normal basilar cisterns. Cervicomedullary junction and pituitary are within normal limits. Vascular: Major intracranial vascular flow voids are preserved. Generalized intracranial artery tortuosity. Skull and upper cervical spine: Visualized bone marrow signal is within normal limits. Partially visible cervical spine degeneration. Sinuses/Orbits: Negative. Other: Mastoids are clear. Grossly normal visible internal auditory structures. Visible scalp and face soft tissues appear negative. IMPRESSION: 1. 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 Many of the areas demonstrate associated petechial hemorrhage - raising the possibility of septic emboli which are prone to bleeding - but there is no malignant hemorrhagic transformation or significant intracranial mass effect. 2. No strong  evidence of metastatic disease to the brain on this non-contrast exam. Note that some of the above infarcts will likely enhance on subsequent MRI without and with contrast, which will confound staging for metastases to a degree. Electronically Signed: By: Genevie Ann M.D. 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 Noncontrast MRI yesterday revealing evidence of extensive embolic infarcts. Postcontrast images now in hopes of better evaluating the possibility of metastatic disease. EXAM: MRI HEAD WITH CONTRAST TECHNIQUE: Multiplanar, multiecho pulse sequences of the brain and surrounding structures were obtained with intravenous contrast. CONTRAST:  84mL GADAVIST GADOBUTROL 1 MMOL/ML IV SOLN COMPARISON:  Noncontrast brain MRI yesterday. FINDINGS: Postcontrast coronal T2 and 3 plane T1 imaging. Scattered small, indistinct, and frequently gyriform areas of bilateral cerebral hemisphere enhancement. Multiple enhancing foci annotated on series 4. On correlation with DWI yesterday, virtually all of the areas correspond to foci of abnormal diffusion. Furthermore, there is no abnormal enhancement identified in the areas of DWI sparing yesterday, such as the right basal ganglia, bilateral thalami, brainstem, and much of the right cerebellum. No dural thickening or enhancement identified. No intracranial mass effect, ventriculomegaly. Compared to noncontrast T1 imaging yesterday the major dural venous sinuses are enhancing and appear to be patent. IMPRESSION: 1. Post ischemic enhancement: numerous scattered small enhancing foci which are frequently gyriform and indistinct, and correspond to areas of abnormal DWI yesterday. 2. No convincing cerebral metastatic disease at this time. Electronically Signed   By: Genevie Ann M.D.   On: 10/10/2020 19:26   CARDIAC CATHETERIZATION  Result Date: 10/04/2020  Dist LAD lesion is 50% stenosed.  1st RPL lesion is 20% stenosed.  Mid RCA lesion is 25% stenosed.  2nd Diag lesion is 40% stenosed.  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 No severe lesions or culprit for NSTEMI - suspect demand ischemia   DG CHEST PORT 1 VIEW  Result Date: 10/10/2020 CLINICAL DATA:  Tachycardia, shortness of breath EXAM: PORTABLE CHEST 1 VIEW COMPARISON:  10/08/2020 FINDINGS: Continued complete opacification of the left hemithorax, unchanged. Right basilar patchy airspace opacity again noted, unchanged. No visible effusion on the right. No acute bony abnormality. IMPRESSION: No significant change since prior study. Electronically Signed   By:  Rolm Baptise M.D.   On: 10/10/2020 12:27   DG CHEST PORT 1 VIEW  Result Date: 10/01/2020 CLINICAL DATA:  Cough, shortness of breath EXAM: PORTABLE CHEST 1 VIEW COMPARISON:  09/30/2020 chest radiograph and prior. FINDINGS: Increased size of large left pleural effusion with decreased left upper lung aeration. Right mid lung nodular opacity is unchanged. No pneumothorax. Right perihilar and basilar opacities, unchanged. Partial obscured cardiomediastinal silhouette. IMPRESSION: 1. Increased size of large left pleural effusion with decreased left upper lung aeration. 2. Right perihilar/basilar opacities are grossly unchanged. Electronically Signed   By: Primitivo Gauze M.D.   On: 10/01/2020 10:42   ECHOCARDIOGRAM COMPLETE  Result Date: 09/30/2020    ECHOCARDIOGRAM REPORT   Patient Name:   Kwamaine KERMITT HARJO Date of Exam: 09/30/2020 Medical Rec #:  [id number]     Height:       69.0 in Accession #:    [id number]     Weight:       214.3 lb Date of Birth:  [birth year]      BSA:          2.127 m Patient Age:    58 years      BP:           151/111 mmHg Patient Gender: M             HR:           111 bpm. Exam Location:  Inpatient Procedure: 2D Echo, Cardiac Doppler and Color Doppler Indications:    Elevated Troponin  History:        Patient has no prior history of Echocardiogram examinations.                 Risk Factors:Former Smoker.  Sonographer:    Vickie Epley RDCS Referring Phys: [id number]  1. Left ventricular ejection fraction, by estimation, is 40 to 45%. The left ventricle has mildly decreased function. The left ventricle demonstrates global hypokinesis. Left ventricular diastolic parameters are indeterminate.  2. Right ventricular systolic function is normal. The right ventricular size is normal. Tricuspid regurgitation signal is inadequate for assessing PA pressure.  3. The mitral valve is normal in structure. No evidence of mitral valve regurgitation. No evidence of mitral stenosis.  4. The aortic valve was not well visualized. Aortic valve regurgitation is trivial. No aortic stenosis is present.  5. Aortic dilatation noted. There is mild dilatation of the ascending aorta, measuring 37 mm.  6. The inferior vena cava is dilated in size with <50% respiratory variability, suggesting right atrial pressure of 15 mmHg. FINDINGS  Left Ventricle: Left ventricular ejection fraction, by estimation, is 40 to 45%. The left ventricle has mildly decreased function. The left ventricle demonstrates global hypokinesis. Definity contrast agent was given IV to delineate the left ventricular  endocardial borders. The left ventricular internal cavity size was normal in size. There is no left ventricular hypertrophy. Left ventricular diastolic parameters are indeterminate. Right Ventricle: The right ventricular size is normal. No increase in right ventricular wall thickness. Right ventricular systolic function is  normal. Tricuspid regurgitation signal is inadequate for assessing PA pressure. Left Atrium: Left atrial size was normal in size. Right Atrium: Right atrial size was not well visualized. Pericardium: There is no evidence of pericardial effusion. Mitral Valve: The mitral valve is normal in structure. No evidence of mitral valve regurgitation. No evidence of mitral valve stenosis. Tricuspid Valve: The tricuspid valve is normal in structure. Tricuspid valve regurgitation is trivial.  Aortic Valve: The aortic valve was not well visualized. Aortic valve regurgitation is trivial. No aortic stenosis is present. Pulmonic Valve: The pulmonic valve was not well visualized. Pulmonic valve regurgitation is not visualized. Aorta: The aortic root is normal in size and structure and aortic dilatation noted. There is mild dilatation of the ascending aorta, measuring 37 mm. Venous: The inferior vena cava is dilated in size with less than 50% respiratory variability, suggesting right atrial pressure of 15 mmHg. IAS/Shunts: The interatrial septum was not well visualized.  LEFT VENTRICLE PLAX 2D LVIDd:         5.10 cm      Diastology LVIDs:         4.40 cm      LV e' medial:    4.46 cm/s LV PW:         0.90 cm      LV E/e' medial:  14.5 LV IVS:        0.90 cm      LV e' lateral:   7.07 cm/s LVOT diam:     2.10 cm      LV E/e' lateral: 9.1 LV SV:         50 LV SV Index:   23 LVOT Area:     3.46 cm  LV Volumes (MOD) LV vol d, MOD A2C: 102.0 ml LV vol d, MOD A4C: 131.0 ml LV vol s, MOD A2C: 68.2 ml LV vol s, MOD A4C: 81.5 ml LV SV MOD A2C:     33.8 ml LV SV MOD A4C:     131.0 ml LV SV MOD BP:      41.2 ml RIGHT VENTRICLE RV S prime:     19.40 cm/s TAPSE (M-mode): 2.2 cm LEFT ATRIUM             Index       RIGHT ATRIUM           Index LA diam:        2.30 cm 1.08 cm/m  RA Area:     11.50 cm LA Vol (A2C):   36.2 ml 17.02 ml/m RA Volume:   28.90 ml  13.58 ml/m LA Vol (A4C):   16.7 ml 7.85 ml/m LA Biplane Vol: 27.1 ml 12.74 ml/m  AORTIC  VALVE LVOT Vmax:   99.20 cm/s LVOT Vmean:  73.000 cm/s LVOT VTI:    0.143 m  AORTA Ao Root diam: 3.90 cm Ao Asc diam:  3.70 cm Ao Desc diam: 2.60 cm MITRAL VALVE MV Area (PHT): 9.85 cm     SHUNTS MV Decel Time: 77 msec      Systemic VTI:  0.14 m MV E velocity: 64.50 cm/s   Systemic Diam: 2.10 cm MV A velocity: 101.00 cm/s MV E/A ratio:  0.64 Oswaldo Milian MD Electronically signed by Oswaldo Milian MD Signature Date/Time: 09/30/2020/9:27:23 PM    Final    VAS US CAROTID  Result Date: 10/10/2020 Carotid Arterial Duplex Study Indications:       CVA. Limitations        Today's exam was limited due to the patient's respiratory                    variation. Comparison Study:  no prior Performing Technologist: Abram Sander RVS  Examination Guidelines: A complete evaluation includes B-mode imaging, spectral Doppler, color Doppler, and power Doppler as needed of all accessible portions of each vessel. Bilateral testing is considered an integral part of a complete examination.  Limited examinations for reoccurring indications may be performed as noted.  Right Carotid Findings: +----------+--------+--------+--------+------------------+--------+           PSV cm/sEDV cm/sStenosisPlaque DescriptionComments +----------+--------+--------+--------+------------------+--------+ CCA Prox  66      7               heterogenous               +----------+--------+--------+--------+------------------+--------+ CCA Distal73      14              heterogenous               +----------+--------+--------+--------+------------------+--------+ ICA Prox  36      9       1-39%   heterogenous               +----------+--------+--------+--------+------------------+--------+ ICA Distal37      17                                         +----------+--------+--------+--------+------------------+--------+ ECA       87      9                                           +----------+--------+--------+--------+------------------+--------+ +----------+--------+-------+--------+-------------------+           PSV cm/sEDV cmsDescribeArm Pressure (mmHG) +----------+--------+-------+--------+-------------------+ [id number]                                         +----------+--------+-------+--------+-------------------+ +---------+--------+--+--------+--+---------+ VertebralPSV cm/s73EDV cm/s19Antegrade +---------+--------+--+--------+--+---------+  Left Carotid Findings: +----------+--------+--------+--------+------------------+--------+           PSV cm/sEDV cm/sStenosisPlaque DescriptionComments +----------+--------+--------+--------+------------------+--------+ CCA Prox  47      11              heterogenous               +----------+--------+--------+--------+------------------+--------+ CCA Distal69      14              heterogenous               +----------+--------+--------+--------+------------------+--------+ ICA Prox  45      14      1-39%   heterogenous               +----------+--------+--------+--------+------------------+--------+ ICA Distal76      26                                         +----------+--------+--------+--------+------------------+--------+ ECA       104                                                +----------+--------+--------+--------+------------------+--------+ +----------+--------+--------+--------+-------------------+           PSV cm/sEDV cm/sDescribeArm Pressure (mmHG) +----------+--------+--------+--------+-------------------+ Subclavian70                                          +----------+--------+--------+--------+-------------------+ +---------+--------+--+--------+--+---------+  VertebralPSV cm/s37EDV cm/s11Antegrade +---------+--------+--+--------+--+---------+   Summary: Right Carotid: Velocities in the right ICA are consistent with a 1-39% stenosis. Left  Carotid: Velocities in the left ICA are consistent with a 1-39% stenosis. Vertebrals: Bilateral vertebral arteries demonstrate antegrade flow. *See table(s) above for measurements and observations.  Electronically signed by Antony Contras MD on 10/10/2020 at 12:17:14 PM.    Final    VAS [place] LOWER EXTREMITY VENOUS (DVT)  Result Date: 10/01/2020  Lower Venous DVT Study Indications: Edema.  Comparison Study: no prior Performing Technologist: Abram Sander RVS  Examination Guidelines: A complete evaluation includes B-mode imaging, spectral Doppler, color Doppler, and power Doppler as needed of all accessible portions of each vessel. Bilateral testing is considered an integral part of a complete examination. Limited examinations for reoccurring indications may be performed as noted. The reflux portion of the exam is performed with the patient in reverse Trendelenburg.  +---------+---------------+---------+-----------+----------+--------------+ RIGHT    CompressibilityPhasicitySpontaneityPropertiesThrombus Aging +---------+---------------+---------+-----------+----------+--------------+ CFV      Full           Yes      Yes                                 +---------+---------------+---------+-----------+----------+--------------+ SFJ      Full                                                        +---------+---------------+---------+-----------+----------+--------------+ FV Prox  Full                                                        +---------+---------------+---------+-----------+----------+--------------+ FV Mid   Full                                                        +---------+---------------+---------+-----------+----------+--------------+ FV DistalFull                                                        +---------+---------------+---------+-----------+----------+--------------+ PFV      Full                                                         +---------+---------------+---------+-----------+----------+--------------+ POP      Full           Yes      Yes                                 +---------+---------------+---------+-----------+----------+--------------+ PTV      Full                                                        +---------+---------------+---------+-----------+----------+--------------+  PERO     Full                                                        +---------+---------------+---------+-----------+----------+--------------+   +---------+---------------+---------+-----------+----------+--------------+ LEFT     CompressibilityPhasicitySpontaneityPropertiesThrombus Aging +---------+---------------+---------+-----------+----------+--------------+ CFV      Full           Yes      Yes                                 +---------+---------------+---------+-----------+----------+--------------+ SFJ      Full                                                        +---------+---------------+---------+-----------+----------+--------------+ FV Prox  Full                                                        +---------+---------------+---------+-----------+----------+--------------+ FV Mid   Full                                                        +---------+---------------+---------+-----------+----------+--------------+ FV DistalFull                                                        +---------+---------------+---------+-----------+----------+--------------+ PFV      Full                                                        +---------+---------------+---------+-----------+----------+--------------+ POP      Full           Yes      Yes                                 +---------+---------------+---------+-----------+----------+--------------+ PTV      Full                                                         +---------+---------------+---------+-----------+----------+--------------+ PERO     Full                                                        +---------+---------------+---------+-----------+----------+--------------+  Summary: BILATERAL: - No evidence of deep vein thrombosis seen in the lower extremities, bilaterally. - No evidence of superficial venous thrombosis in the lower extremities, bilaterally. -No evidence of popliteal cyst, bilaterally.   *See table(s) above for measurements and observations. Electronically signed by Deitra Mayo MD on 10/01/2020 at 2:08:23 PM.    Final    VAS [place] TRANSCRANIAL DOPPLER  Result Date: 10/10/2020  Transcranial Doppler with Bubble Indications: Stroke. Performing Technologist: Abram Sander RVS  Examination Guidelines: A complete evaluation includes B-mode imaging, spectral Doppler, color Doppler, and power Doppler as needed of all accessible portions of each vessel. Bilateral testing is considered an integral part of a complete examination. Limited examinations for reoccurring indications may be performed as noted.  +----------+-------------+----------+-----------+-------+ RIGHT TCD Right VM (cm)Depth (cm)PulsatilityComment +----------+-------------+----------+-----------+-------+ MCA           21.00                 1.28            +----------+-------------+----------+-----------+-------+ Term ICA      33.00                 1.10            +----------+-------------+----------+-----------+-------+ ICA siphon    27.00                 1.11            +----------+-------------+----------+-----------+-------+  +----------+------------+----------+-----------+-------+ LEFT TCD  Left VM (cm)Depth (cm)PulsatilityComment +----------+------------+----------+-----------+-------+ MCA          25.00                 1.36            +----------+------------+----------+-----------+-------+ Term ICA     14.00                 1.27             +----------+------------+----------+-----------+-------+ PCA          14.00                 0.89            +----------+------------+----------+-----------+-------+ Opthalmic    24.00                 1.52            +----------+------------+----------+-----------+-------+ ICA siphon   28.00                 1.05            +----------+------------+----------+-----------+-------+  Summary:  Poor bitemporal and absent suboccipital window limit exam.Low normal mean flow velocities in majority of identified vessels of anterior circulation of unclear significance *See table(s) above for TCD measurements and observations.  Diagnosing physician: Antony Contras MD Electronically signed by Antony Contras MD on 10/10/2020 at 12:18:38 PM.    Final    IR THORACENTESIS ASP PLEURAL SPACE W/IMG GUIDE  Result Date: 10/02/2020 INDICATION: Patient with suspected lung cancer and recurrent left pleural effusions. Interventional radiology asked to perform a therapeutic thoracentesis. EXAM: ULTRASOUND GUIDED THORACENTESIS MEDICATIONS: 1% lidocaine 10 mL COMPLICATIONS: None immediate. PROCEDURE: An ultrasound guided thoracentesis was thoroughly discussed with the patient and questions answered. The benefits, risks, alternatives and complications were also discussed. The patient understands and wishes to proceed with the procedure. Written consent was obtained. Ultrasound was performed to localize and mark an adequate pocket of fluid in the left chest. The area was then  prepped and draped in the normal sterile fashion. 1% Lidocaine was used for local anesthesia. Under ultrasound guidance a 6 Fr Safe-T-Centesis catheter was introduced. Thoracentesis was performed. The catheter was removed and a dressing applied. FINDINGS: A total of approximately 600 mL of maroon-colored fluid was removed. IMPRESSION: Successful ultrasound guided left thoracentesis yielding 600 mL of pleural fluid. Read by: Soyla Dryer, NP  Electronically Signed   By: Markus Daft M.D.   On: 10/02/2020 09:28   IR THORACENTESIS ASP PLEURAL SPACE W/IMG GUIDE  Result Date: 09/30/2020 INDICATION: Shortness of breath. Large left pleural effusion. Request for diagnostic therapeutic thoracentesis. EXAM: ULTRASOUND GUIDED LEFT THORACENTESIS MEDICATIONS: 1% plain lidocaine, 5 mL COMPLICATIONS: None immediate. PROCEDURE: An ultrasound guided thoracentesis was thoroughly discussed with the patient and questions answered. The benefits, risks, alternatives and complications were also discussed. The patient understands and wishes to proceed with the procedure. Written consent was obtained. Ultrasound was performed to localize and mark an adequate pocket of fluid in the left chest. The area was then prepped and draped in the normal sterile fashion. 1% Lidocaine was used for local anesthesia. Under ultrasound guidance a 6 Fr Safe-T-Centesis catheter was introduced. Thoracentesis was performed. The catheter was removed and a dressing applied. FINDINGS: A total of approximately 950 mL of dark, old bloody fluid was removed. Samples were sent to the laboratory as requested by the clinical team. IMPRESSION: Successful ultrasound guided left thoracentesis yielding 950 mL of pleural fluid. Read by: Ascencion Dike PA-C Electronically Signed   By: Jerilynn Mages.  Shick M.D.   On: 09/30/2020 10:13       Subjective: Patient seen and examined at bedside.  Sleepy, wakes up slightly, hardly participates in any conversation.  Daughter at bedside and states that her father looks comfortable.     Discharge Exam: Vitals:   10/16/20 0554 10/16/20 1301  BP: 115/87 113/65  Pulse: (!) 103 95  Resp:  20  Temp: 98.3 F (36.8 C)   SpO2: 97% 98%    General exam: Chronically ill-appearing and deconditioned gentleman.  Drowsy.  No distress.    The results of significant diagnostics from this hospitalization (including imaging, microbiology, ancillary and laboratory) are listed  below for reference.     Microbiology: Recent Results (from the past 240 hour(s))  Surgical PCR screen     Status: None   Collection Time: 10/08/20  8:51 AM   Specimen: Nasal Mucosa; Nasal Swab  Result Value Ref Range Status   MRSA, PCR NEGATIVE NEGATIVE Final   Staphylococcus aureus NEGATIVE NEGATIVE Final    Comment: (NOTE) The Xpert SA Assay (FDA approved for NASAL specimens in patients 62 years of age and older), is one component of a comprehensive surveillance program. It is not intended to diagnose infection nor to guide or monitor treatment. Performed at [facility], [place] [address], [place],  [address]      Labs: BNP (last 3 results) Recent Labs    09/29/20 1635  BNP 035.4*   Basic Metabolic Panel: Recent Labs  Lab 10/10/20 0133 10/11/20 0134 10/12/20 0126 10/13/20 0041 10/14/20 0822 10/15/20 0520  NA 137 136 136 134* 135 138  K 4.5 4.2 4.5 4.5 4.6 4.8  CL 102 102 103 102 100 103  CO2 27 25 22 23 22 23   GLUCOSE 110* 108* 84 111* 111* 115*  BUN 24* 21* 20 26* 29* 35*  CREATININE 1.52* 1.36* 1.35* 1.56* 1.54* 1.64*  CALCIUM 8.6* 8.5* 8.5* 8.5* 8.3* 8.4*  MG 1.9  2.0 2.0 2.0  --  2.1  PHOS 3.3 3.7 3.7 3.8  --   --    Liver Function Tests: Recent Labs  Lab 10/11/20 0134 10/12/20 0126 10/13/20 0041 10/14/20 0822 10/15/20 0520  AST 53* 59* 70* 84* 94*  ALT 43 41 43 48* 54*  ALKPHOS 195* 216* 254* 410* 373*  BILITOT 1.0 0.8 0.8 0.9 0.7  PROT 5.4* 5.5* 5.5* 5.4* 6.1*  ALBUMIN 2.1* 2.2* 2.1* 2.1* 2.4*   No results for input(s): LIPASE, AMYLASE in the last 168 hours. No results for input(s): AMMONIA in the last 168 hours. CBC: Recent Labs  Lab 10/11/20 0134 10/12/20 0126 10/13/20 0041 10/14/20 0822 10/15/20 0520  WBC 11.5* 11.5* 13.8* 14.9* 15.6*  NEUTROABS 9.1* 9.1* 11.4* 12.4* 13.1*  HGB 11.3* 12.0* 11.5* 11.6* 12.2*  HCT 35.3* 38.6* 37.4* 36.8* 39.6  MCV 78.8* 79.8* 79.7* 79.3* 81.3  PLT 158 151 151 163 168   Cardiac  Enzymes: No results for input(s): CKTOTAL, CKMB, CKMBINDEX, TROPONINI in the last 168 hours. BNP: Invalid input(s): POCBNP CBG: No results for input(s): GLUCAP in the last 168 hours. D-Dimer No results for input(s): DDIMER in the last 72 hours. Hgb A1c No results for input(s): HGBA1C in the last 72 hours. Lipid Profile No results for input(s): CHOL, HDL, LDLCALC, TRIG, CHOLHDL, LDLDIRECT in the last 72 hours. Thyroid function studies No results for input(s): TSH, T4TOTAL, T3FREE, THYROIDAB in the last 72 hours.  Invalid input(s): FREET3 Anemia work up No results for input(s): VITAMINB12, FOLATE, FERRITIN, TIBC, IRON, RETICCTPCT in the last 72 hours. Urinalysis    Component Value Date/Time   COLORURINE YELLOW 09/29/2020 1751   APPEARANCEUR CLEAR 09/29/2020 1751   LABSPEC 1.005 09/29/2020 1751   PHURINE 6.0 09/29/2020 1751   GLUCOSEU NEGATIVE 09/29/2020 1751   HGBUR MODERATE (A) 09/29/2020 1751   BILIRUBINUR NEGATIVE 09/29/2020 1751   KETONESUR NEGATIVE 09/29/2020 1751   PROTEINUR NEGATIVE 09/29/2020 1751   NITRITE NEGATIVE 09/29/2020 1751   LEUKOCYTESUR NEGATIVE 09/29/2020 1751   Sepsis Labs Invalid input(s): PROCALCITONIN,  WBC,  LACTICIDVEN Microbiology Recent Results (from the past 240 hour(s))  Surgical PCR screen     Status: None   Collection Time: 10/08/20  8:51 AM   Specimen: Nasal Mucosa; Nasal Swab  Result Value Ref Range Status   MRSA, PCR NEGATIVE NEGATIVE Final   Staphylococcus aureus NEGATIVE NEGATIVE Final    Comment: (NOTE) The Xpert SA Assay (FDA approved for NASAL specimens in patients 70 years of age and older), is one component of a comprehensive surveillance program. It is not intended to diagnose infection nor to guide or monitor treatment. Performed at [facility], [place] [address] [place], [place] [address]      Time coordinating discharge: 35 minutes  SIGNED:   Aline August, MD  [facility] 10/16/2020, 1:04 PM

## 2020-10-16 NOTE — Progress Notes (Signed)
Patient ID: Brett Medina, male   DOB: 04-25-1961, 59 y.o.   MRN: [id number]  PROGRESS NOTE    Brett Medina  LOV:[id number] DOB: May 11, 1961 DOA: 09/29/2020 PCP: Patient, No Pcp Per   Brief Narrative:  59 year old male with history of tobacco abuse presented with ongoing worsening shortness of breath and bilateral lower extremity swelling.  Work-up with chest CT showed constellation of findings consistent with metastatic malignancy, moderate to large volume left pleural effusion with almost complete collapse of left upper and lower lobes.  He underwent thoracentesis on 09/30/2020 by IR with 950 cc of dark bloody fluid removed.  oncology/radiation oncology were consulted. Cardiology was consulted for elevated troponin and tachycardia.  Echo showed EF of 40 to 45% with global left ventricular hypokinesis. On 10/01/2020, chest x-ray showed recurrent large left pleural effusion and IR was consulted for repeat left thoracentesis. Palliative care was consulted for goals of care discussion because of overall very poor prognosis. He was transferred to [facility] for initiation of palliative radiation. He refused radiation treatment.  After discussion with palliative care team, he decided to proceed with comfort measures/hospice.  Assessment & Plan:   Acute respiratory failure with hypoxia New diagnosis of metastatic non-small cell lung cancer Large left pleural effusion status post thoracentesis Acute metabolic encephalopathy Numerous acute/subacute embolic infarcts versus?  Metastases Possible MAT/atrial flutter Acute systolic CHF Elevated troponin, suspect demand ischemia Nonobstructive CAD Leukocytosis AKI Mild transaminitis ?  UTI Enterococcus faecalis  Plan -After discussion with palliative care team, he decided to proceed with comfort measures/hospice.  Care management following and working on residential hospice versus home hospice    DVT prophylaxis: None per comfort  measures Code Status: DNR Family Communication:  daughter at bedside Disposition Plan: Status is: Inpatient  Remains inpatient appropriate because: Patient is extremely ill and deconditioned   Dispo:  Patient From:  Home  Planned Disposition: Possible residential/home hospice  Expected discharge date: 10/16/2020  Medically stable for discharge:   Yes   Consultants: PCCM/cardiology/IR/neurology/oncology/radiation oncology  Procedures:  11/30  LEFT thoracentesis aspirated 950 cc fluid 12/1 LEFT thoracentesis; aspirated 638ml maroon-colored fluid 2D echo 12/3 LEFT heart cath;  Antimicrobials:  Anti-infectives (From admission, onward)   Start     Dose/Rate Route Frequency Ordered Stop   10/05/20 1615  amoxicillin (AMOXIL) capsule 500 mg  Status:  Discontinued        500 mg Oral Every 8 hours 10/05/20 1528 10/08/20 1028   10/05/20 1445  cefTRIAXone (ROCEPHIN) 2 g in sodium chloride 0.9 % 100 mL IVPB  Status:  Discontinued        2 g 200 mL/hr over 30 Minutes Intravenous Every 24 hours 10/05/20 1354 10/05/20 1527   09/29/20 1630  cefTRIAXone (ROCEPHIN) 2 g in sodium chloride 0.9 % 100 mL IVPB  Status:  Discontinued        2 g 200 mL/hr over 30 Minutes Intravenous Every 24 hours 09/29/20 1624 09/29/20 2226   09/29/20 1630  azithromycin (ZITHROMAX) 500 mg in sodium chloride 0.9 % 250 mL IVPB  Status:  Discontinued        500 mg 250 mL/hr over 60 Minutes Intravenous Every 24 hours 09/29/20 1624 09/29/20 2226       Subjective: Patient seen and examined at bedside.  Sleepy, wakes up slightly, hardly participates in any conversation.  Daughter at bedside and states that her father looks comfortable.  Objective: Vitals:   10/15/20 2155 10/16/20 0324 10/16/20 0500 10/16/20 0554  BP: 122/83 111/79  115/87  Pulse: (!) 102   (!) 103  Resp: 20     Temp: (!) 97.5 F (36.4 C)   98.3 F (36.8 C)  TempSrc:    Oral  SpO2: 99%   97%  Weight:   89.2 kg   Height:        Intake/Output  Summary (Last 24 hours) at 10/16/2020 0953 Last data filed at 10/16/2020 0500 Gross per 24 hour  Intake --  Output 800 ml  Net -800 ml   Filed Weights   10/14/20 0400 10/15/20 0500 10/16/20 0500  Weight: 85.7 kg 89.2 kg 89.2 kg    Examination:  General exam: Chronically ill-appearing and deconditioned gentleman.  Drowsy.  No distress.   Data Reviewed: I have personally reviewed following labs and imaging studies  CBC: Recent Labs  Lab 10/11/20 0134 10/12/20 0126 10/13/20 0041 10/14/20 0822 10/15/20 0520  WBC 11.5* 11.5* 13.8* 14.9* 15.6*  NEUTROABS 9.1* 9.1* 11.4* 12.4* 13.1*  HGB 11.3* 12.0* 11.5* 11.6* 12.2*  HCT 35.3* 38.6* 37.4* 36.8* 39.6  MCV 78.8* 79.8* 79.7* 79.3* 81.3  PLT 158 151 151 163 622   Basic Metabolic Panel: Recent Labs  Lab 10/10/20 0133 10/11/20 0134 10/12/20 0126 10/13/20 0041 10/14/20 0822 10/15/20 0520  NA 137 136 136 134* 135 138  K 4.5 4.2 4.5 4.5 4.6 4.8  CL 102 102 103 102 100 103  CO2 27 25 22 23 22 23   GLUCOSE 110* 108* 84 111* 111* 115*  BUN 24* 21* 20 26* 29* 35*  CREATININE 1.52* 1.36* 1.35* 1.56* 1.54* 1.64*  CALCIUM 8.6* 8.5* 8.5* 8.5* 8.3* 8.4*  MG 1.9 2.0 2.0 2.0  --  2.1  PHOS 3.3 3.7 3.7 3.8  --   --    GFR: Estimated Creatinine Clearance: 54.2 mL/min (A) (by C-G formula based on SCr of 1.64 mg/dL (H)). Liver Function Tests: Recent Labs  Lab 10/11/20 0134 10/12/20 0126 10/13/20 0041 10/14/20 0822 10/15/20 0520  AST 53* 59* 70* 84* 94*  ALT 43 41 43 48* 54*  ALKPHOS 195* 216* 254* 410* 373*  BILITOT 1.0 0.8 0.8 0.9 0.7  PROT 5.4* 5.5* 5.5* 5.4* 6.1*  ALBUMIN 2.1* 2.2* 2.1* 2.1* 2.4*   No results for input(s): LIPASE, AMYLASE in the last 168 hours. No results for input(s): AMMONIA in the last 168 hours. Coagulation Profile: No results for input(s): INR, PROTIME in the last 168 hours. Cardiac Enzymes: No results for input(s): CKTOTAL, CKMB, CKMBINDEX, TROPONINI in the last 168 hours. BNP (last 3  results) No results for input(s): PROBNP in the last 8760 hours. HbA1C: No results for input(s): HGBA1C in the last 72 hours. CBG: No results for input(s): GLUCAP in the last 168 hours. Lipid Profile: No results for input(s): CHOL, HDL, LDLCALC, TRIG, CHOLHDL, LDLDIRECT in the last 72 hours. Thyroid Function Tests: No results for input(s): TSH, T4TOTAL, FREET4, T3FREE, THYROIDAB in the last 72 hours. Anemia Panel: No results for input(s): VITAMINB12, FOLATE, FERRITIN, TIBC, IRON, RETICCTPCT in the last 72 hours. Sepsis Labs: No results for input(s): PROCALCITON, LATICACIDVEN in the last 168 hours.  Recent Results (from the past 240 hour(s))  Surgical PCR screen     Status: None   Collection Time: 10/08/20  8:51 AM   Specimen: Nasal Mucosa; Nasal Swab  Result Value Ref Range Status   MRSA, PCR NEGATIVE NEGATIVE Final   Staphylococcus aureus NEGATIVE NEGATIVE Final    Comment: (NOTE) The Xpert SA Assay (FDA approved for NASAL specimens in patients  58 years of age and older), is one component of a comprehensive surveillance program. It is not intended to diagnose infection nor to guide or monitor treatment. Performed at [facility], [place] [address] [place], [place] [address]          Radiology Studies: No results found.      Scheduled Meds: . aspirin EC  81 mg Oral Daily  . digoxin  0.25 mg Oral Daily  . enoxaparin (LOVENOX) injection  40 mg Subcutaneous Q24H  . guaiFENesin-codeine  10 mL Oral QHS  . mouth rinse  15 mL Mouth Rinse BID  . metoprolol tartrate  100 mg Oral BID  . mirtazapine  15 mg Oral QHS  . sodium chloride flush  3 mL Intravenous Q12H  . sodium chloride flush  3 mL Intravenous Q12H   Continuous Infusions: . sodium chloride            Aline August, MD [facility] 10/16/2020, 9:53 AM

## 2020-10-16 NOTE — Progress Notes (Signed)
Pt discharged at this time in no acute distress accompanied by family.

## 2020-10-16 NOTE — TOC Transition Note (Addendum)
Transition of Care [facility]) - CM/SW Discharge Note   Patient Details  Name: Brett Medina MRN: [id number] Date of Birth: [birth year]  [facility] CM/SW Contact:  Dessa Phi, RN Phone Number: 10/16/2020, 12:52 PM   Clinical Narrative: Address patient will stay: [place] [address] tel#[phone].d/c today to home w/hospice-[facility] will manage all dme-hospital bed/02,3n1,w/c-dme will be delivered to home prior patient d/c home, & 02 tank brought to hospital-dme company used-[employer].Family to transport home on own.DNR/MOST form in packet @ nsg station.No further CM needs.   2:42p-[facility] rep-[employer]-Delivery of dme to home around 4p;son in law to bring travel 02 tank to hospital,then can d/c home.    Final next level of care: Home w Hospice Care Barriers to Discharge: No Barriers Identified   Patient Goals and CMS Choice Patient states their goals for this hospitalization and ongoing recovery are:: go home CMS Medicare.gov Compare Post Acute Care list provided to:: Patient Represenative (must comment) Choice offered to / list presented to : [place]  Discharge Placement                       Discharge Plan and Services In-house Referral: Hospice / Palliative Care,Chaplain,Financial Counselor Discharge Planning Services: CM Consult Post Acute Care Choice: Hospice          DME Arranged: Hospice Equipment Package Others                    Social Determinants of Health (SDOH) Interventions     Readmission Risk Interventions No flowsheet data found.

## 2020-10-16 NOTE — Progress Notes (Signed)
   Comfort measures noted - cardiology will sign-off, call with questions.  [facility] will sign off.   Medication Recommendations:  Adjust per comfort measures Other recommendations (labs, testing, etc):  none Follow up as an outpatient:  None  Pixie Casino, MD, [facility], [place] Director of the [facility] Lipid Disorders &  Cardiovascular Risk Reduction [facility] Diplomate of the American Board of Clinical Lipidology Attending Cardiologist  Direct Dial: [phone]  Fax: [phone]  Website:  [email or web address]

## 2020-10-16 NOTE — Plan of Care (Signed)
  Problem: Health Behavior/Discharge Planning: Goal: Ability to manage health-related needs will improve Outcome: Progressing   Problem: Clinical Measurements: Goal: Will remain free from infection Outcome: Progressing   

## 2020-10-17 ENCOUNTER — Ambulatory Visit: Payer: Self-pay

## 2020-10-17 ENCOUNTER — Encounter: Payer: Self-pay | Admitting: *Deleted

## 2020-10-17 ENCOUNTER — Other Ambulatory Visit: Payer: Self-pay | Admitting: *Deleted

## 2020-10-17 NOTE — Progress Notes (Signed)
I followed up on patient's molecular testing and checked [facility] patient portal.  I did not see that they had any tissue on this patient.  I called pathology dept.  I was updated that per pathology request that the tissue went to [facility] for more staining and the tech was waiting for the tissue to come back. The tissue came back yesterday and this was sent to [facility] for molecular testing and PDL 1.  I will notify Dr. Burr Medico.

## 2020-10-17 NOTE — Progress Notes (Signed)
I received a message from Dr. Burr Medico that patient went home with hospice and to cancel [facility]. I called and notified pathology team to cancel [facility] testing.

## 2020-10-17 NOTE — Progress Notes (Signed)
The proposed treatment discussed in cancer conference 10/17/20 is for discussion purpose only and is not a binding recommendation.  The patient was not physically examined nor present for her treatment options.  Therefore, final treatment plans cannot be decided.

## 2020-10-18 ENCOUNTER — Ambulatory Visit: Payer: Self-pay

## 2020-10-21 ENCOUNTER — Ambulatory Visit: Payer: Self-pay

## 2020-10-29 ENCOUNTER — Encounter (HOSPITAL_COMMUNITY): Payer: Self-pay | Admitting: Hematology

## 2020-10-31 ENCOUNTER — Encounter (HOSPITAL_COMMUNITY): Payer: Self-pay | Admitting: Hematology

## 2020-11-02 DEATH — deceased

## 2020-11-13 LAB — ACID FAST CULTURE WITH REFLEXED SENSITIVITIES (MYCOBACTERIA): Acid Fast Culture: NEGATIVE

## 2022-05-02 IMAGING — DX DG CHEST 1V PORT
1 series · 1 of 1 positions shown · non-contrast
Comparison: 09/30/2020 chest radiograph and prior.

CLINICAL DATA: Cough, shortness of breath

EXAM:
PORTABLE CHEST 1 VIEW

[chest ap]
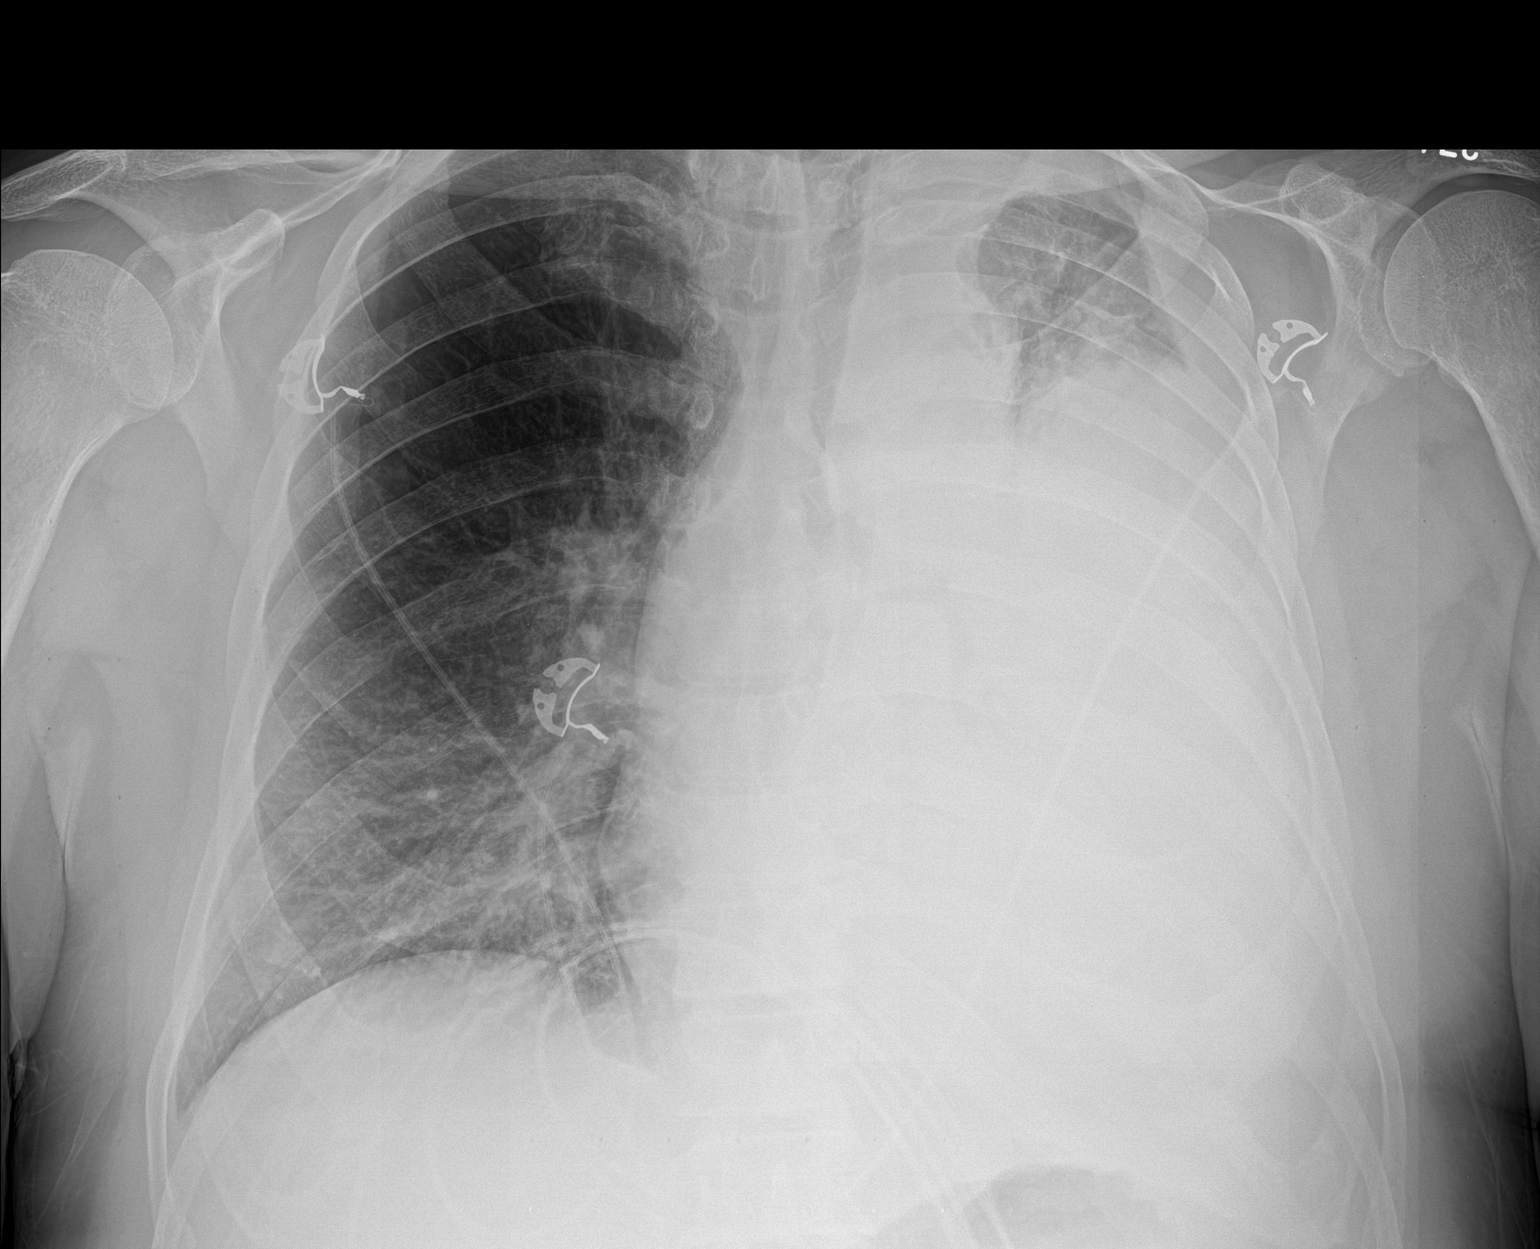

[1 of 1 positions shown; findings below may reference images not displayed]

FINDINGS: Increased size of large left pleural effusion with decreased left
upper lung aeration. Right mid lung nodular opacity is unchanged. No
pneumothorax. Right perihilar and basilar opacities, unchanged.
Partial obscured cardiomediastinal silhouette.
IMPRESSION: 1. Increased size of large left pleural effusion with decreased left
upper lung aeration.
2. Right perihilar/basilar opacities are grossly unchanged.

## 2022-05-03 IMAGING — CT CT ABD-PELV W/O CM
2 of 7 series · 11 of 36 positions shown, 14 images · non-contrast
Comparison: Chest radiograph dated 10/02/2020. chest CT dated
09/29/2020.

CLINICAL DATA: 59-year-old male with cancer of unknown primary.

EXAM:
CT CHEST, ABDOMEN AND PELVIS WITHOUT CONTRAST
TECHNIQUE: Multidetector CT imaging of the chest, abdomen and pelvis was
performed following the standard protocol without IV contrast.

[Series 4: cap wo 5.0 i31f 2 · axial · 0.96mm/px · z∈[-857,-287]mm · 10 of 140 slices shown, 13 images]
[im 13/140  mediastinal]
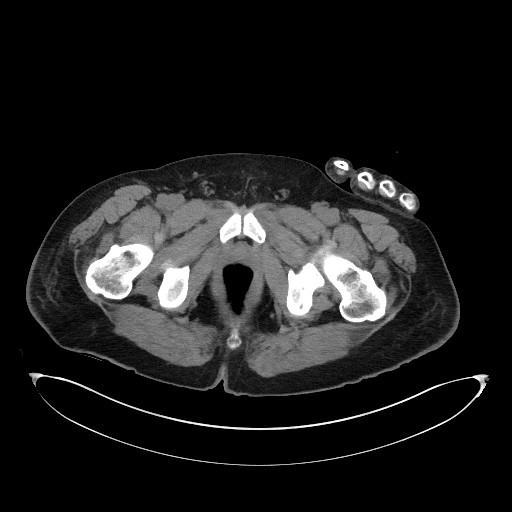
[im 13/140  lung]
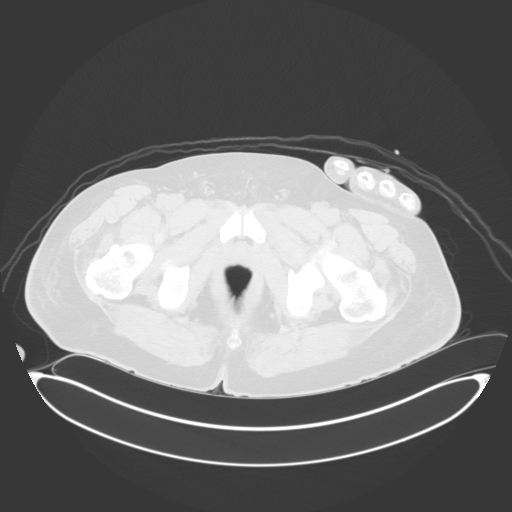
[im 26/140  lung]
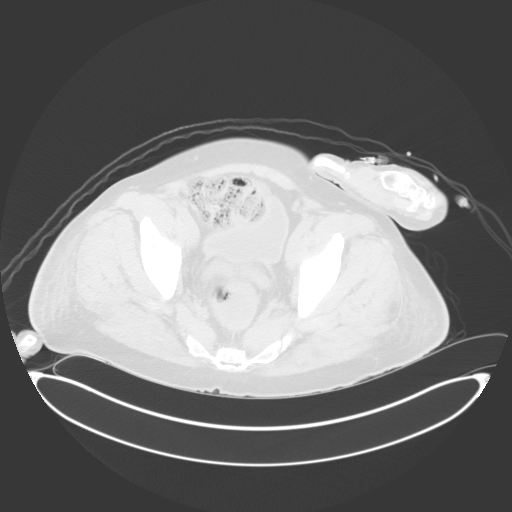
[im 38/140  lung]
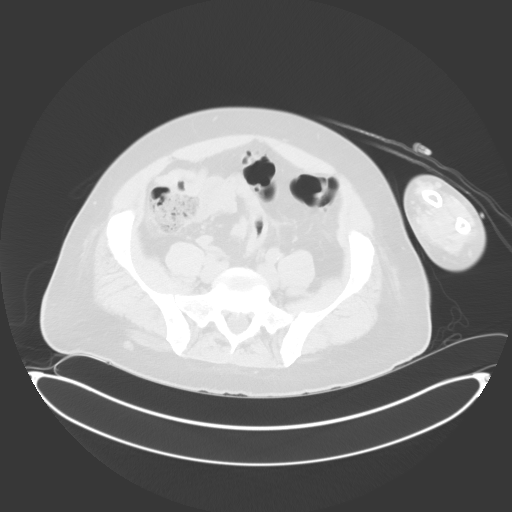
[im 51/140  lung]
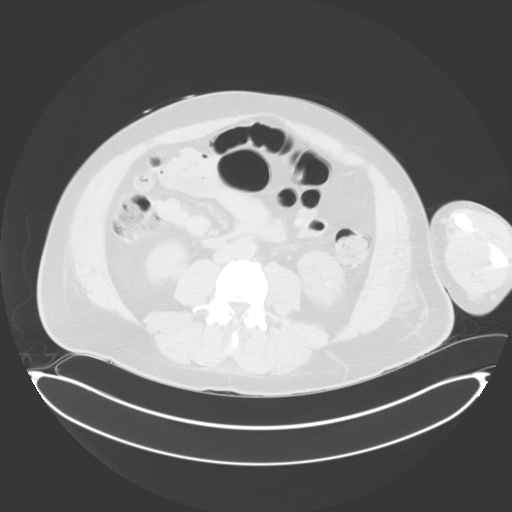
[im 64/140  mediastinal]
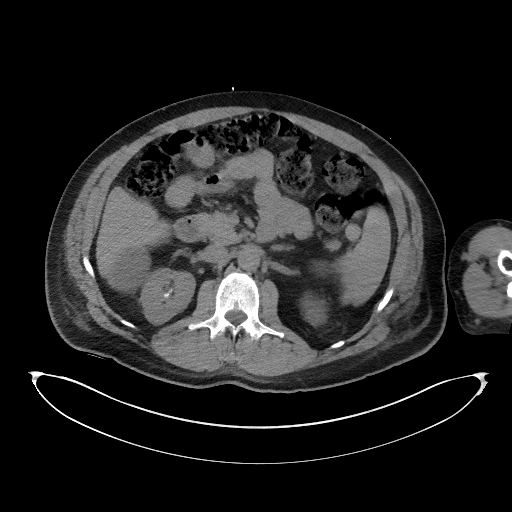
[im 64/140  lung]
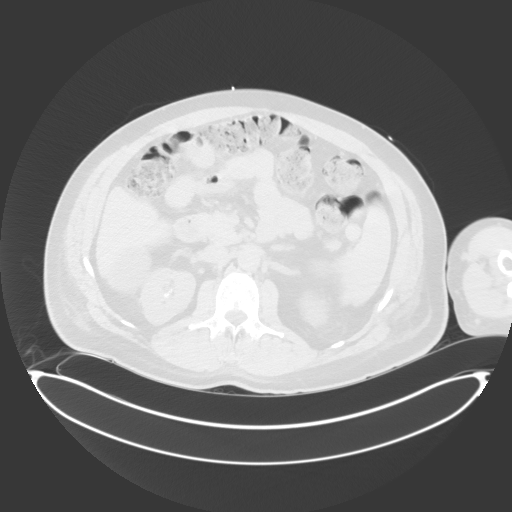
[im 76/140  lung]
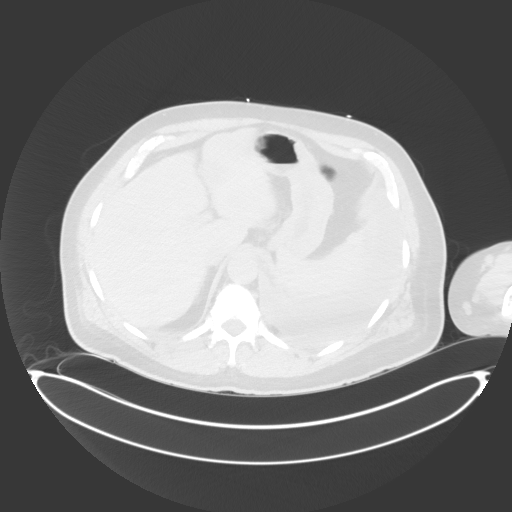
[im 89/140  lung]
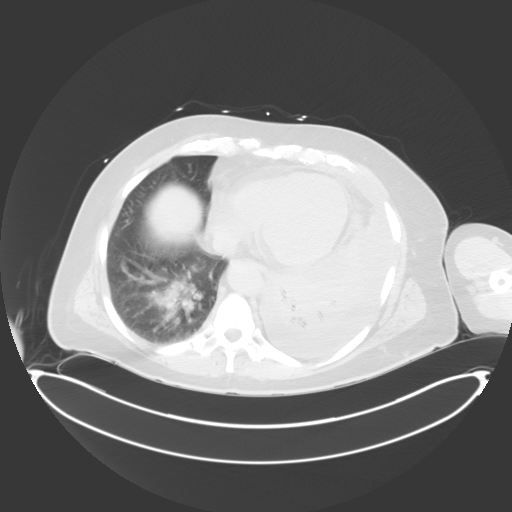
[im 102/140  lung]
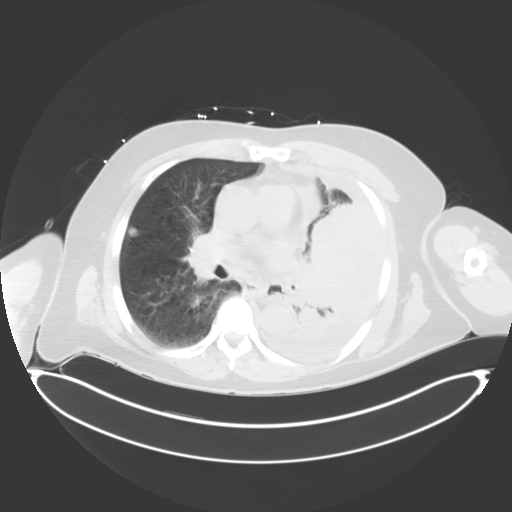
[im 114/140  mediastinal]
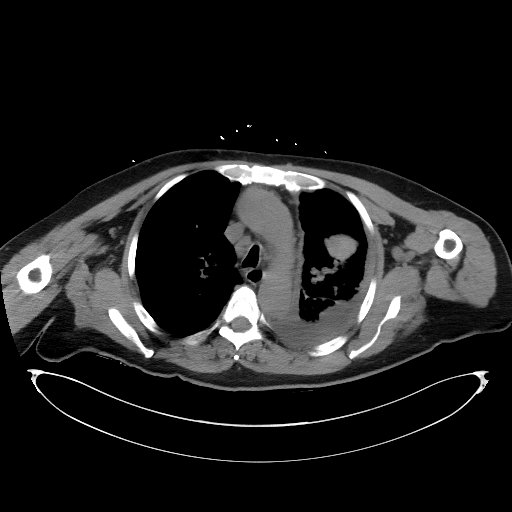
[im 114/140  lung]
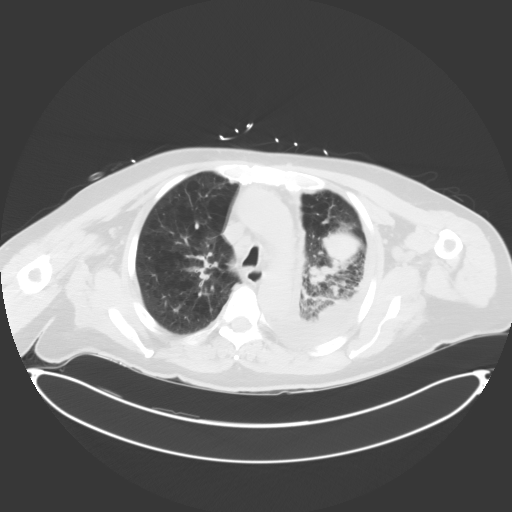
[im 127/140  lung]
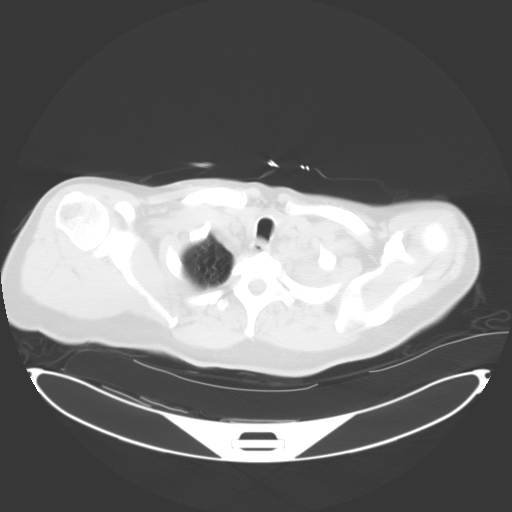

[Series 7: coronal · coronal · 0.91mm/px · 1 of 150 slices shown]
[im 75/150  lung]
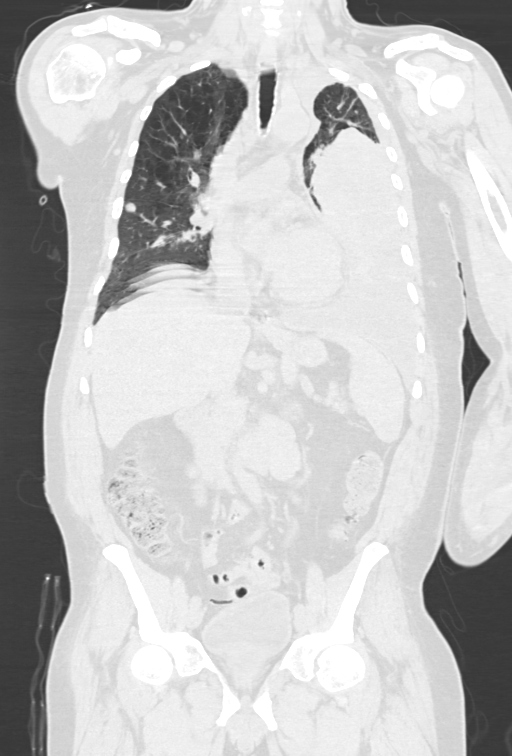

[11 of 36 positions shown; findings below may reference images not displayed]

FINDINGS: Evaluation of this exam is limited in the absence of intravenous
contrast.

CT CHEST FINDINGS

Cardiovascular: There is no cardiomegaly or pericardial effusion.
The thoracic aorta and central pulmonary arteries are grossly
unremarkable.

Mediastinum/Nodes: Mildly enlarged right paratracheal lymph node
measures 12 mm. The esophagus and the thyroid gland are grossly
unremarkable. No mediastinal fluid collection.

Lungs/Pleura: There is a 10.0 x 7.5 x 12.0 cm mass in the left upper
lobe. There is diffuse interstitial coarsening and nodularity of the
left lung with smaller scattered nodules concerning for metastatic
spread. There is a small left pleural effusion, likely malignant
effusion. There is consolidative changes of the majority of the left
lung which may be combination of atelectasis, infiltrate, or
metastasis. Several scattered nodules in the right lung measure up
to 1 cm consistent with metastatic disease.

There is a patchy area of nodular and ground-glass density in the
right infrahilar region which may represent pneumonia, or
aspiration. Metastasis is not excluded. There is background of
moderate to severe centrilobular emphysema. No pneumothorax. There
is compression and high-grade narrowing of the left lower lobe
bronchus and occlusion of the left upper lobe bronchus by the left
lung mass.

There is apparent nodular thickening of the left pleural consistent
with metastatic implant.

Musculoskeletal: There is sclerotic lesion in the T6 as well as
sclerotic changes of C7. Small sclerotic focus in T9.

CT ABDOMEN PELVIS FINDINGS

No intra-abdominal free air or free fluid.

Hepatobiliary: Multiple hepatic hypodense lesions consistent with
metastatic disease. These measure up to 2.5 cm in the left lobe of
the liver. There is morphologic changes of cirrhosis or pseudo
cirrhosis. No calcified gallstone or pericholecystic fluid.

Pancreas: The pancreas is grossly unremarkable.

Spleen: Heterogeneous appearance of the spleen.

Adrenals/Urinary Tract: The adrenal glands are unremarkable.
Multiple nonobstructing bilateral renal calculi measure up to 8 mm
in the inferior pole of the right kidney. There is no hydronephrosis
on either side. There is a 6 cm right renal cyst. The visualized
ureters and urinary bladder appear unremarkable.

Stomach/Bowel: There is circumferential thickening of the
rectosigmoid involving approximately 9 cm segment (95/8) which may
be inflammatory in etiology. There is however apparent shouldering
of the soft tissue (sagittal 93/8) concerning for underlying
malignancy. Clinical correlation and further evaluation with
sigmoidoscopy or colonoscopy is recommended. There is moderate stool
throughout the colon. There is colonic diverticulosis without active
inflammatory changes. There is no bowel obstruction. The appendix is
normal.

Vascular/Lymphatic: The abdominal aorta and IVC unremarkable. No
portal venous gas. No adenopathy.

Reproductive: The prostate and seminal vesicles are grossly
unremarkable.

Other: Subcutaneous edema of the lateral pelvic wall. No fluid
collection.

Musculoskeletal: Faint irregular lucency involving the left superior
pubic ramus concerning for metastatic disease. Several scattered
small sclerotic lesions also noted involving the right ischium,
sacral bone also concerning for metastatic disease. No acute
fracture.
IMPRESSION: 1. Large left upper lobe mass with findings of metastatic disease in
the lungs, liver, and bones.
2. Circumferential thickening of the rectosigmoid with apparent
shouldering concerning for underlying malignancy. Clinical
correlation and further evaluation with sigmoidoscopy or colonoscopy
is recommended.
3. Nonobstructing bilateral renal calculi. No hydronephrosis.
4. Colonic diverticulosis. No bowel obstruction. Normal appendix.
5. Aortic Atherosclerosis (4PL6X-QF2.2) and Emphysema (4PL6X-CPI.N).

## 2022-05-03 IMAGING — US IR THORACENTESIS ASP PLEURAL SPACE W/IMG GUIDE
1 series · 2 of 2 positions shown · non-contrast
Comparison: none

INDICATION: Patient with suspected lung cancer and recurrent left pleural
effusions. Interventional radiology asked to perform a therapeutic
thoracentesis.

[Series 1: (id) · 2 of 2 slices shown]
[im 1/2]
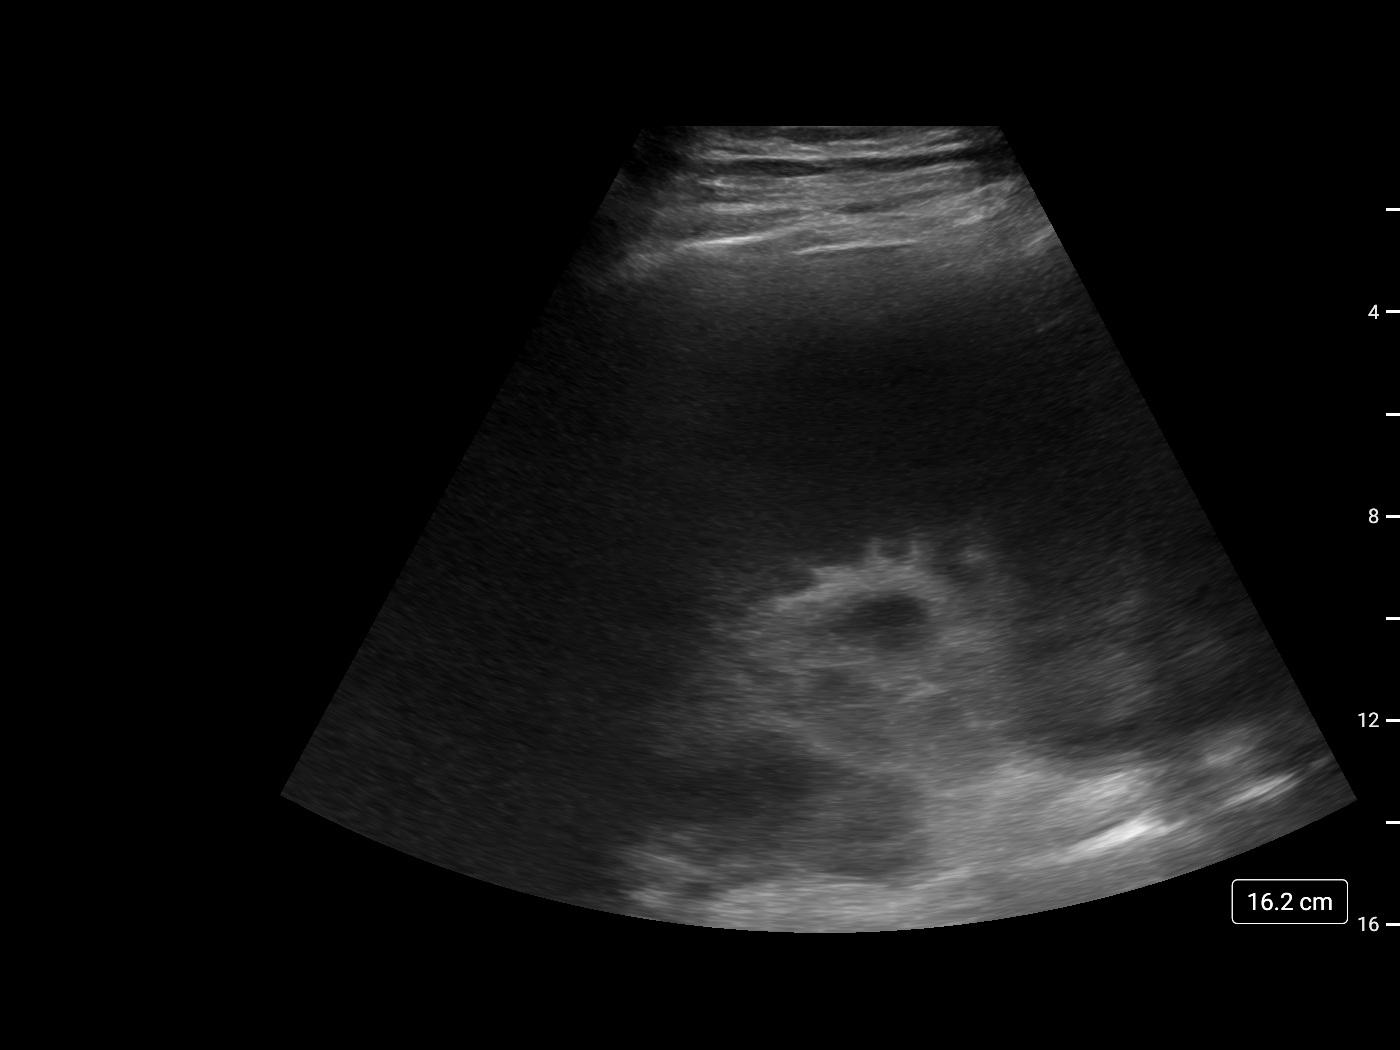
[im 2/2]
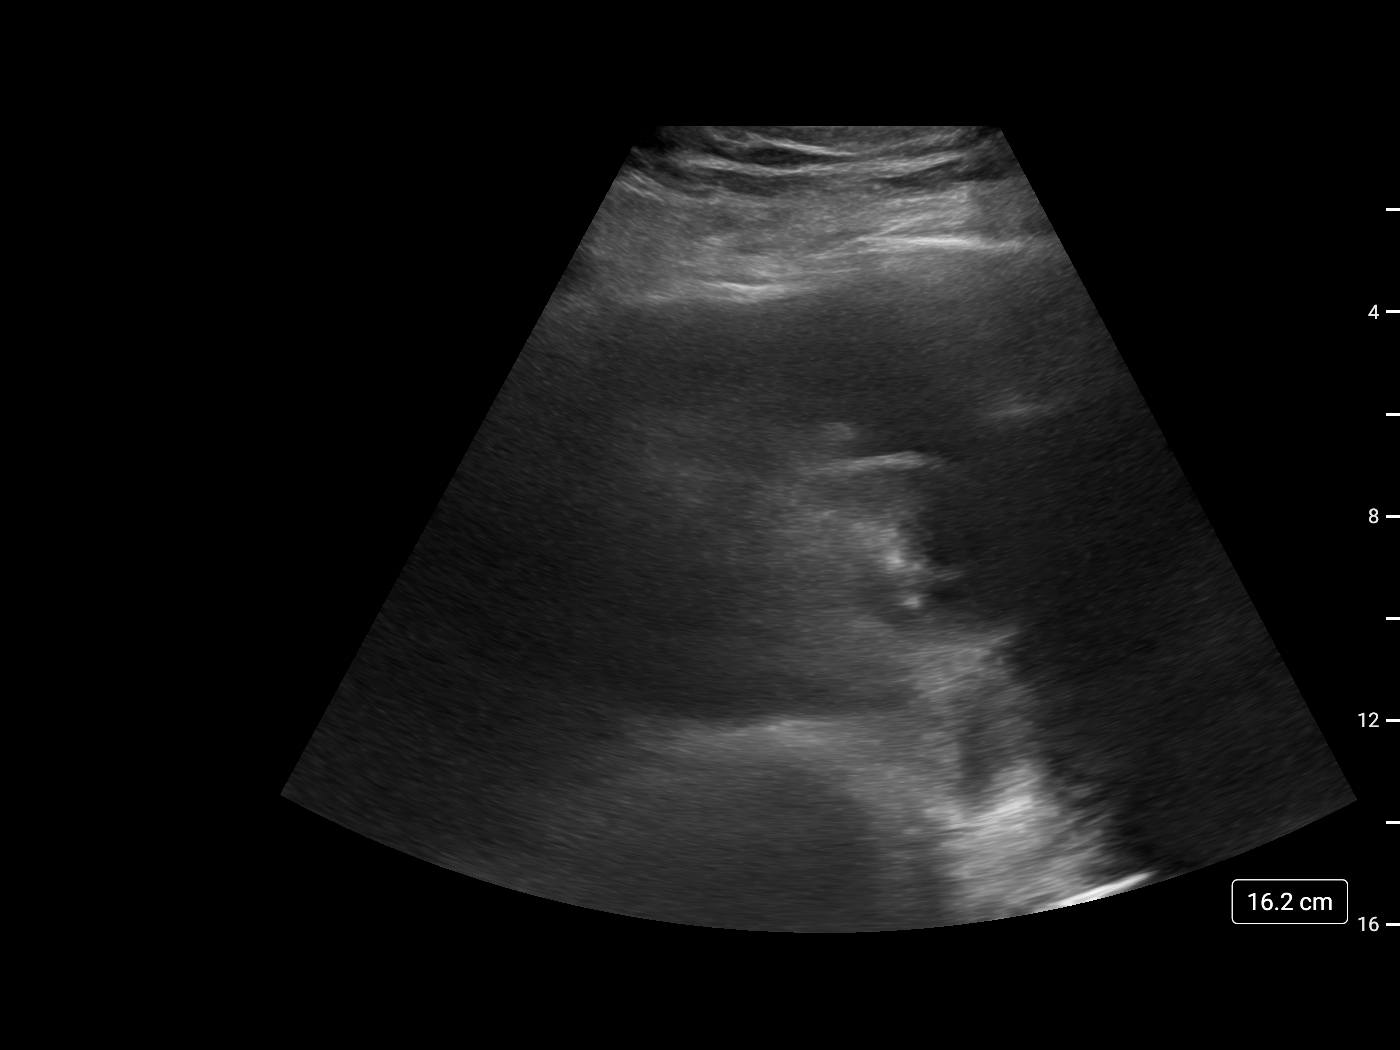

[2 of 2 positions shown; findings below may reference images not displayed]

EXAM:
ULTRASOUND GUIDED THORACENTESIS

MEDICATIONS:
1% lidocaine 10 mL

COMPLICATIONS:
None immediate.

PROCEDURE:
An ultrasound guided thoracentesis was thoroughly discussed with the
patient and questions answered. The benefits, risks, alternatives
and complications were also discussed. The patient understands and
wishes to proceed with the procedure. Written consent was obtained.

Ultrasound was performed to localize and mark an adequate pocket of
fluid in the left chest. The area was then prepped and draped in the
normal sterile fashion. 1% Lidocaine was used for local anesthesia.
Under ultrasound guidance a 6 Fr Safe-T-Centesis catheter was
introduced. Thoracentesis was performed. The catheter was removed
and a dressing applied.
FINDINGS: A total of approximately 600 mL of [place] fluid was removed.
IMPRESSION: Successful ultrasound guided left thoracentesis yielding 600 mL of
pleural fluid. Read by: Yasuro Samuelson, NP
